# Patient Record
Sex: Female | Born: 1965
Health system: Southern US, Community
[De-identification: ages and names within clinical notes are randomized; demographics above are authoritative.]

## PROBLEM LIST (undated history)

## (undated) DIAGNOSIS — R5383 Other fatigue: Secondary | ICD-10-CM

## (undated) DIAGNOSIS — C50919 Malignant neoplasm of unspecified site of unspecified female breast: Secondary | ICD-10-CM

## (undated) DIAGNOSIS — Z803 Family history of malignant neoplasm of breast: Secondary | ICD-10-CM

## (undated) DIAGNOSIS — D649 Anemia, unspecified: Secondary | ICD-10-CM

## (undated) DIAGNOSIS — Z853 Personal history of malignant neoplasm of breast: Secondary | ICD-10-CM

## (undated) DIAGNOSIS — F419 Anxiety disorder, unspecified: Secondary | ICD-10-CM

## (undated) DIAGNOSIS — F411 Generalized anxiety disorder: Secondary | ICD-10-CM

## (undated) DIAGNOSIS — R7303 Prediabetes: Secondary | ICD-10-CM

## (undated) DIAGNOSIS — Z923 Personal history of irradiation: Secondary | ICD-10-CM

## (undated) DIAGNOSIS — C801 Malignant (primary) neoplasm, unspecified: Secondary | ICD-10-CM

## (undated) DIAGNOSIS — E119 Type 2 diabetes mellitus without complications: Secondary | ICD-10-CM

## (undated) DIAGNOSIS — E039 Hypothyroidism, unspecified: Secondary | ICD-10-CM

## (undated) DIAGNOSIS — R0602 Shortness of breath: Secondary | ICD-10-CM

## (undated) DIAGNOSIS — E785 Hyperlipidemia, unspecified: Secondary | ICD-10-CM

## (undated) DIAGNOSIS — M549 Dorsalgia, unspecified: Secondary | ICD-10-CM

## (undated) DIAGNOSIS — C50211 Malignant neoplasm of upper-inner quadrant of right female breast: Principal | ICD-10-CM

## (undated) DIAGNOSIS — I10 Essential (primary) hypertension: Secondary | ICD-10-CM

## (undated) DIAGNOSIS — J189 Pneumonia, unspecified organism: Secondary | ICD-10-CM

## (undated) HISTORY — DX: Personal history of malignant neoplasm of breast: Z85.3

## (undated) HISTORY — DX: Shortness of breath: R06.02

## (undated) HISTORY — DX: Malignant neoplasm of upper-inner quadrant of right female breast: C50.211

## (undated) HISTORY — DX: Prediabetes: R73.03

## (undated) HISTORY — PX: CHOLECYSTECTOMY: SHX55

## (undated) HISTORY — DX: Dorsalgia, unspecified: M54.9

## (undated) HISTORY — DX: Hyperlipidemia, unspecified: E78.5

## (undated) HISTORY — DX: Personal history of irradiation: Z92.3

## (undated) HISTORY — PX: BARTHOLIN GLAND CYST EXCISION: SHX565

## (undated) HISTORY — PX: BREAST BIOPSY: SHX20

## (undated) HISTORY — DX: Family history of malignant neoplasm of breast: Z80.3

## (undated) HISTORY — PX: BREAST LUMPECTOMY: SHX2

## (undated) HISTORY — PX: OTHER SURGICAL HISTORY: SHX169

## (undated) HISTORY — DX: Generalized anxiety disorder: F41.1

## (undated) HISTORY — DX: Other fatigue: R53.83

---

## 1997-09-28 ENCOUNTER — Other Ambulatory Visit: Admission: RE | Admit: 1997-09-28 | Discharge: 1997-09-28 | Payer: Self-pay | Admitting: Gynecology

## 1999-05-31 ENCOUNTER — Other Ambulatory Visit: Admission: RE | Admit: 1999-05-31 | Discharge: 1999-05-31 | Payer: Self-pay | Admitting: Obstetrics and Gynecology

## 2000-03-10 ENCOUNTER — Inpatient Hospital Stay (HOSPITAL_COMMUNITY): Admission: RE | Admit: 2000-03-10 | Discharge: 2000-03-10 | Payer: Self-pay | Admitting: *Deleted

## 2000-06-30 ENCOUNTER — Inpatient Hospital Stay (HOSPITAL_COMMUNITY): Admission: RE | Admit: 2000-06-30 | Discharge: 2000-06-30 | Payer: Self-pay | Admitting: Obstetrics and Gynecology

## 2000-07-18 ENCOUNTER — Other Ambulatory Visit: Admission: RE | Admit: 2000-07-18 | Discharge: 2000-07-18 | Payer: Self-pay | Admitting: *Deleted

## 2000-07-27 ENCOUNTER — Inpatient Hospital Stay (HOSPITAL_COMMUNITY): Admission: AD | Admit: 2000-07-27 | Discharge: 2000-07-27 | Payer: Self-pay | Admitting: Obstetrics and Gynecology

## 2001-04-26 ENCOUNTER — Inpatient Hospital Stay (HOSPITAL_COMMUNITY): Admission: AD | Admit: 2001-04-26 | Discharge: 2001-04-30 | Payer: Self-pay | Admitting: *Deleted

## 2001-04-26 ENCOUNTER — Encounter (INDEPENDENT_AMBULATORY_CARE_PROVIDER_SITE_OTHER): Payer: Self-pay | Admitting: Specialist

## 2001-05-30 ENCOUNTER — Other Ambulatory Visit: Admission: RE | Admit: 2001-05-30 | Discharge: 2001-05-30 | Payer: Self-pay | Admitting: *Deleted

## 2001-08-04 ENCOUNTER — Encounter: Admission: RE | Admit: 2001-08-04 | Discharge: 2001-08-04 | Payer: Self-pay | Admitting: Internal Medicine

## 2001-09-16 ENCOUNTER — Encounter: Payer: Self-pay | Admitting: *Deleted

## 2001-09-16 ENCOUNTER — Ambulatory Visit (HOSPITAL_COMMUNITY): Admission: RE | Admit: 2001-09-16 | Discharge: 2001-09-16 | Payer: Self-pay | Admitting: *Deleted

## 2002-06-10 ENCOUNTER — Other Ambulatory Visit: Admission: RE | Admit: 2002-06-10 | Discharge: 2002-06-10 | Payer: Self-pay | Admitting: Obstetrics & Gynecology

## 2002-09-18 ENCOUNTER — Encounter: Payer: Self-pay | Admitting: Obstetrics & Gynecology

## 2002-09-18 ENCOUNTER — Ambulatory Visit (HOSPITAL_COMMUNITY): Admission: RE | Admit: 2002-09-18 | Discharge: 2002-09-18 | Payer: Self-pay | Admitting: Obstetrics & Gynecology

## 2003-06-15 ENCOUNTER — Other Ambulatory Visit: Admission: RE | Admit: 2003-06-15 | Discharge: 2003-06-15 | Payer: Self-pay | Admitting: Obstetrics & Gynecology

## 2003-08-24 ENCOUNTER — Ambulatory Visit (HOSPITAL_COMMUNITY): Admission: RE | Admit: 2003-08-24 | Discharge: 2003-08-24 | Payer: Self-pay | Admitting: Obstetrics & Gynecology

## 2003-09-20 ENCOUNTER — Encounter: Admission: RE | Admit: 2003-09-20 | Discharge: 2003-09-20 | Payer: Self-pay | Admitting: Obstetrics & Gynecology

## 2004-10-18 ENCOUNTER — Encounter: Admission: RE | Admit: 2004-10-18 | Discharge: 2004-10-18 | Payer: Self-pay | Admitting: Obstetrics & Gynecology

## 2005-03-18 ENCOUNTER — Emergency Department (HOSPITAL_COMMUNITY): Admission: EM | Admit: 2005-03-18 | Discharge: 2005-03-18 | Payer: Self-pay | Admitting: Family Medicine

## 2005-05-21 ENCOUNTER — Emergency Department (HOSPITAL_COMMUNITY): Admission: EM | Admit: 2005-05-21 | Discharge: 2005-05-21 | Payer: Self-pay | Admitting: Family Medicine

## 2005-10-23 ENCOUNTER — Ambulatory Visit (HOSPITAL_COMMUNITY): Admission: RE | Admit: 2005-10-23 | Discharge: 2005-10-23 | Payer: Self-pay | Admitting: Obstetrics & Gynecology

## 2006-06-17 ENCOUNTER — Ambulatory Visit (HOSPITAL_COMMUNITY): Admission: RE | Admit: 2006-06-17 | Discharge: 2006-06-17 | Payer: Self-pay | Admitting: Internal Medicine

## 2006-07-19 ENCOUNTER — Encounter (INDEPENDENT_AMBULATORY_CARE_PROVIDER_SITE_OTHER): Payer: Self-pay | Admitting: General Surgery

## 2006-07-19 ENCOUNTER — Ambulatory Visit (HOSPITAL_COMMUNITY): Admission: RE | Admit: 2006-07-19 | Discharge: 2006-07-19 | Payer: Self-pay | Admitting: General Surgery

## 2006-07-25 ENCOUNTER — Ambulatory Visit (HOSPITAL_COMMUNITY): Admission: RE | Admit: 2006-07-25 | Discharge: 2006-07-25 | Payer: Self-pay | Admitting: General Surgery

## 2006-07-26 ENCOUNTER — Ambulatory Visit (HOSPITAL_COMMUNITY): Admission: RE | Admit: 2006-07-26 | Discharge: 2006-07-26 | Payer: Self-pay | Admitting: General Surgery

## 2006-10-29 ENCOUNTER — Ambulatory Visit (HOSPITAL_COMMUNITY): Admission: RE | Admit: 2006-10-29 | Discharge: 2006-10-29 | Payer: Self-pay | Admitting: Obstetrics & Gynecology

## 2006-11-05 ENCOUNTER — Ambulatory Visit (HOSPITAL_COMMUNITY): Admission: RE | Admit: 2006-11-05 | Discharge: 2006-11-05 | Payer: Self-pay | Admitting: General Surgery

## 2007-04-20 ENCOUNTER — Emergency Department (HOSPITAL_COMMUNITY): Admission: EM | Admit: 2007-04-20 | Discharge: 2007-04-20 | Payer: Self-pay | Admitting: Family Medicine

## 2007-09-02 ENCOUNTER — Encounter: Admission: RE | Admit: 2007-09-02 | Discharge: 2007-10-21 | Payer: Self-pay | Admitting: Orthopedic Surgery

## 2007-11-13 ENCOUNTER — Ambulatory Visit (HOSPITAL_COMMUNITY): Admission: RE | Admit: 2007-11-13 | Discharge: 2007-11-13 | Payer: Self-pay | Admitting: Obstetrics & Gynecology

## 2007-11-17 ENCOUNTER — Encounter: Admission: RE | Admit: 2007-11-17 | Discharge: 2007-11-17 | Payer: Self-pay | Admitting: Obstetrics & Gynecology

## 2008-12-14 ENCOUNTER — Ambulatory Visit (HOSPITAL_COMMUNITY): Admission: RE | Admit: 2008-12-14 | Discharge: 2008-12-14 | Payer: Self-pay | Admitting: Obstetrics & Gynecology

## 2009-01-24 ENCOUNTER — Emergency Department (HOSPITAL_COMMUNITY): Admission: EM | Admit: 2009-01-24 | Discharge: 2009-01-24 | Payer: Self-pay | Admitting: Emergency Medicine

## 2009-10-12 ENCOUNTER — Emergency Department (HOSPITAL_COMMUNITY): Admission: EM | Admit: 2009-10-12 | Discharge: 2009-10-12 | Payer: Self-pay | Admitting: Family Medicine

## 2010-01-10 ENCOUNTER — Ambulatory Visit (HOSPITAL_COMMUNITY)
Admission: RE | Admit: 2010-01-10 | Discharge: 2010-01-10 | Payer: Self-pay | Source: Home / Self Care | Admitting: Obstetrics & Gynecology

## 2010-01-19 ENCOUNTER — Encounter: Admission: RE | Admit: 2010-01-19 | Discharge: 2010-01-19 | Payer: Self-pay | Admitting: Obstetrics & Gynecology

## 2010-03-12 ENCOUNTER — Encounter: Payer: Self-pay | Admitting: Obstetrics & Gynecology

## 2010-03-13 ENCOUNTER — Encounter: Payer: Self-pay | Admitting: General Surgery

## 2010-07-04 NOTE — Op Note (Signed)
Marie Wilson, Marie Wilson NO.:  [id number]   MEDICAL RECORD NO.:  [id number]          PATIENT TYPE:  AMB   LOCATION:  SDS                          FACILITY:  [facility]   PHYSICIAN:  Ollen Gross. Vernell Morgans, M.D. DATE OF BIRTH:  1966-01-04   DATE OF PROCEDURE:  07/19/2006  DATE OF DISCHARGE:  07/19/2006                               OPERATIVE REPORT   PREOPERATIVE DIAGNOSIS:  Gallstones.   POSTOPERATIVE DIAGNOSIS:  Cholecystitis with cholelithiasis.   PROCEDURE:  Laparoscopic cholecystectomy with intraoperative  cholangiogram.   SURGEON:  Dr. Carolynne Edouard.   ASSISTANT:  Dr. Zachery Dakins.   ANESTHESIA:  General endotracheal.   PROCEDURE:  After informed consent was obtained, the patient was brought  to the operating room and placed in the supine position on the operating  table.  After adequate induction of general anesthesia, the patient's  abdomen was prepped with Betadine and draped in usual sterile manner.  The area below the umbilicus was infiltrated with 0.25% Marcaine.  A  small incision was made with a 15 blade knife.  This incision was  carried down through the subcutaneous tissue bluntly with a hemostat and  Army-Navy retractors until the linea alba was identified.  Linea alba  was incised with a 15 blade knife, and each side was grasped with Kocher  clamps and elevated anteriorly.  The preperitoneal space was probed  bluntly with a hemostat until the peritoneum was opened, and access was  gained to the abdominal cavity.  The 0 Vicryl pursestring stitch was  placed in the fascia around the opening.  Hasson cannula was placed  through the opening and anchored in place with the previously placed  Vicryl pursestring stitch.  The abdomen was then insufflated with carbon  dioxide without difficulty.  The patient was placed in head-up position  and rotated slightly with the right side up.  Laparoscope was inserted  through the Hasson cannula, and the right upper quadrant was  inspected.  The dome of the gallbladder and liver readily identified.  Next, the  epigastric region was infiltrated with 0.25% Marcaine.  A small incision  was made a 15 blade knife, and a 10-mm port was placed bluntly through  this incision into the abdominal cavity under direct vision.  Sites were  then chosen laterally on the right side of the abdomen for placement of  5-mm ports.  Each of these areas were infiltrated with 0.25% Marcaine.  Small stab incisions were made with a 15 blade knife, and 5-mm ports  were placed bluntly through these incisions into the abdominal cavity  under direct vision.  The gallbladder was very thick and edematous and  inflamed, and we were unable to grasp it with blunt graspers.  We  therefore placed an aspirator through the epigastric port and punctured  the dome of the gallbladder to aspirate some clear fluid.  This allowed  [place] to put a blunt grasper through the lateral most 5-mm port and grasp  the dome of gallbladder and elevate it anteriorly and superiorly.  Another blunt grasper was placed through the other 5-mm port and used  to  retract on the body and neck of the gallbladder.  There were some  omental adhesions to the body of the gallbladder that were taken down  bluntly with dissecting.  Once the gallbladder was able to be elevated,  a dissector was used to open the peritoneal reflection at the  gallbladder neck with the electrocautery.  Blunt dissection was then  carried out in this area until the gallbladder neck/cystic duct junction  was readily identified, and a good window was created.  A single clip  was placed on the gallbladder neck.  A small ductotomy was made just  below the clip.  A 14-gauge Angiocath was placed percutaneously through  the anterior abdominal wall under direct vision.  A Reddick  cholangiogram catheter was placed through the Angiocath and flushed.  The Reddick catheter was then placed within the cystic duct and  anchored  in place with the clip.  A cholangiogram was obtained that showed no  filling defects, good emptying in the duodenum and adequate length on  the cystic duct.  The anchoring clip and catheter was then removed from  the patient.  Three clips were placed proximally on the cystic duct, and  the duct was divided between the 2 sets of clips.  Posterior to this,  the cystic artery was identified and again dissected bluntly in a  circumferential manner until a good window was created.  Two clips were  placed proximally and one distally on the artery, and the artery was  divided between the two.  Next, a laparoscopic hook cautery device was  used to separate the gallbladder from liver bed.  Prior to completely  detaching the gallbladder from liver bed, several small bleeding points  were coagulated with electrocautery until the area was completely  hemostatic.  The gallbladder was then detached the rest of the way from  liver bed without difficulty with the laparoscopic hook cautery.  Laparoscopic bag was inserted through the epigastric port.  Gallbladder  was placed in the bag and the bag was sealed.  The abdomen was then  irrigated with copious amounts of saline until the effluent was clear.  The liver bed was inspected again and found to be hemostatic.  The  laparoscope was then moved to the epigastric port.  A gallbladder  grasper was placed through the Hasson cannula and used to grasp the  opening of the bag.  The bag with the gallbladder was then removed  through the infraumbilical port without difficulty.  The fascial defect  was closed with the previously placed Vicryl pursestring stitch as well  as with another figure-of-eight 0 Vicryl stitch.  The rest of ports were  then removed under direct vision and were found be hemostatic.  Gas was  allowed to escape.  Skin incisions were all closed with interrupted 4-0 Monocryl subcuticular stitches.  Benzoin, Steri-Strips and sterile   dressings were applied.  The patient tolerated the procedure well.  At  the end of the case, all needle, sponge and instrument counts were  correct.  The patient was then awakened and taken to recovery in stable  condition.      Ollen Gross. Vernell Morgans, M.D.  Electronically Signed     PST/MEDQ  D:  07/19/2006  T:  07/19/2006  Job:  [id number]

## 2010-07-07 NOTE — Op Note (Signed)
NAME:  Marie Wilson, Marie Wilson                         ACCOUNT NO.:  [id number]   MEDICAL RECORD NO.:  [id number]                   PATIENT TYPE:  AMB   LOCATION:  SDC                                  FACILITY:  [facility]   PHYSICIAN:  Genia Del, M.D.             DATE OF BIRTH:  25-Jul-1965   DATE OF PROCEDURE:  08/24/2003  DATE OF DISCHARGE:                                 OPERATIVE REPORT   PREOPERATIVE DIAGNOSIS:  Recurrent left Bartholin's gland abscess.   POSTOPERATIVE DIAGNOSIS:  Recurrent left Bartholin's gland abscess.   PROCEDURE:  Left Bartholin's gland marsupialization.   SURGEON:  Genia Del, M.D.   ANESTHESIOLOGIST:  Dr. Jean Rosenthal.   DESCRIPTION OF PROCEDURE:  Under general anesthesia with endotracheal  intubation, the patient is in the lithotomy position.  She is prepped with  Hibiclens in the suprapubic vulvar and vaginal areas and draped as usual.  On examination, we see the site of incision and drainage on the left lower  vulva.  It is about 1 cm in diameter.  The left Bartholin gland abscess is  palpated at about 2 to 3 cm in diameter.  It extends superiorly and  inferiorly over that diameter and about 2 cm wide.  The incision is  prolonged vertically with a scalpel to an opening of about 2 cm.  The gland  is opened.  Exploration with a finger is done.  All loculations are opened.  No pus evacuated but a lot of induration is present.  We proceed with  marsupialization with about 10 stitches all around joining the wall of the  gland to the skin of the vulva around the incision.  That completes  hemostasis.  We also used the electrocautery just on the inside of the gland  to complete hemostasis a little deeper.  We then pack the left Bartholin  gland with one inch plain packing.  We pack it very tight to help with  hemostasis as well.   ESTIMATED BLOOD LOSS:  The estimated blood loss was minimal.   No complications occurred and the patient was transferred to  recovery room  in good status.  Note that she will follow up in two days at [facility]  office to remove the packing.  At the beginning of the intervention cultures  were done.  The patient will be sent home on Flagyl 500 mg b.i.d. for seven  days.                                               Genia Del, M.D.    ML/MEDQ  D:  08/24/2003  T:  08/24/2003  Job:  [id number]

## 2010-07-07 NOTE — Discharge Summary (Signed)
[facility]  Patient:    Marie Wilson, Marie Wilson Visit Number: [id number] MRN: [id number]          Service Type: OBS Location: 910A 9118 01 Attending Physician:  Ermalene Searing Dictated by:   Genia Del, M.D. Admit Date:  04/26/2001 Discharge Date: 04/30/2001                             Discharge Summary  ADMISSION DIAGNOSES:          1. 40 weeks 6 days gestation.                               2. Induction for postdates.                               3. Favorable cervix.  DISCHARGE DIAGNOSES:          1. 40 weeks 6 days gestation.                               2. Induction for postdates.                               3. Favorable cervix.                               4. Nonreassuring fetal heart rate monitoring.                               5. Birth of healthy babyboy by cesarean section.  HOSPITAL COURSE:              Marie Wilson is a gravida 1, 40 weeks 6 days gestation, induction for postdates with unfavorable cervix.  She had cervical ripening with Cervidil and then Pitocin was started.  Artificial rupture of membranes was achieved around 4:15 a.m. on April 27, 2001.  At that time, fetal heart rate was 130 to 140 per minute, accellerations were present and good variability was seen.  But decellerations lasting three minutes in the 70s recovered with oxygen and left decubitus position.  Given that uterine contractions were difficult to pickup an intrauterine pressure catheter was inserted and an electrode for heart rate monitoring.  Around 10 a.m. the decision was made to proceed with an urgent cesarean section because of frequent prolonged deep variable decellerations.  At that time, the patients cervix was 4 cm, 90%, vertex -1.  An urgent low transverse cesarean section was done.  Thick meconium was found at the time of delivery and two tight nuchal cords were present.  Birth of a babyboy at 11:10.  DeLee suction was done after delivery of  the head and the baby was given to the neonatal team. Apgars were 8 and 9, pH was 7.26.  Estimated blood loss was 1000 cc.  No complications.  Postoperative course was unremarkable.  The patient remaines stable and afebrile.  She was discharged on postoperative day #3.  Her hemoglobin on postoperative day #1 was 11 with a hematocrit of 33.6.  She was given postoperative and postpartum advise.  She was prescribed Tylox one tablet  p.o. q.4h. p.r.n. x30 and she will follow up at [facility] in four to six weeks. Dictated by:   Genia Del, M.D. Attending Physician:  Marina Gravel B DD:  05/25/01 TD:  05/26/01 Job: [id number]

## 2010-07-07 NOTE — H&P (Signed)
[facility]  Patient:    TERIA, KHACHATRYAN Visit Number: [id number] MRN: [id number]          Service Type: Attending:  Marina Gravel, M.D. Dictated by:   Marina Gravel, M.D. Adm. Date:  04/26/01                           History and Physical  ADMISSION DIAGNOSIS:          Post dates pregnancy.  INTENDED PROCEDURE:           Induction of labor.  HISTORY OF PRESENT ILLNESS:   A 45 year old white female gravida 1 para 0 at 53 and five-sevenths weeks presents for cervical ripening and induction of labor.  Prenatal care at [facility], Dr. Marina Gravel, uncomplicated except for advanced maternal age.  Normal amniocentesis.  PAST MEDICAL HISTORY:         Uterine fibroids, HPV.  PAST SURGICAL HISTORY:        None.  ALLERGIES:                    None.  SOCIAL HISTORY:               No alcohol, tobacco, or other drugs.  FAMILY HISTORY:               Mother with breast cancer in her 67s, sister with Hodgkins lymphoma, and her father with coronary artery disease.  REVIEW OF SYSTEMS:            Otherwise noncontributory.  PHYSICAL EXAMINATION:  VITAL SIGNS:                  Height 5 feet 6 inches, weight 269 pounds. Blood pressure 124/86.  GENERAL:                      Alert and oriented, no acute distress.  SKIN:                         Warm and dry, no lesions.  HEART:                        Regular rate and rhythm.  LUNGS:                        Clear to auscultation.  ABDOMEN:                      Gravid, fundal height appropriate.  PELVIC:                       Cervix is closed, 50% effaced, -1 station, vertex presentation.  LABORATORY DATA:              Ultrasound on March 6 shows estimated fetal weight 3813 g (8 pounds 6 ounces), normal amniotic fluid, normal fetal breathing movement and activity.  ASSESSMENT:                   Post dates pregnancy, unfavorable cervix.  PLAN:                         Admission for overnight cervical  ripening and Pitocin for induction of labor. Dictated by:   Marina Gravel, M.D. Attending:  Marina Gravel, M.D. DD:  04/24/01 TD:  04/24/01 Job: [id number]

## 2010-07-07 NOTE — Op Note (Signed)
[facility]  Patient:    Marie, Wilson Visit Number: [id number] MRN: [id number]          Service Type: OBS Location: 910A 9118 01 Attending Physician:  Ermalene Searing Dictated by:   Genia Del, M.D. Admit Date:  04/26/2001                             Operative Report  DATE OF BIRTH:                [birth year]  PREOPERATIVE DIAGNOSES:       1. A 40 week and 6 day gestation.                               2. Induction for postdates.                               3. Nonreassuring fetal heart rate monitoring.  POSTOPERATIVE DIAGNOSES:      1. A 40 week and 6 day gestation.                               2. Induction for postdates.                               3. Nonreassuring fetal heart rate monitoring.                               4. Thick meconium.                               5. Two tight nuchal cords.  INTERVENTION:                 Urgent low transverse cesarean section.  SURGEON:                      Genia Del, M.D.  ANESTHESIOLOGIST:             Raul Del, M.D.  DESCRIPTION OF PROCEDURE:     Under epidural with the patient in the 15-degree left decubitus position, she was prepped with Betadine on the suprapubic, vulvar and vagina areas.  A bladder catheter was already inserted.  The patient was draped as usual.  A Pfannenstiel incision was made with a scalpel. The aponeurosis was opened transversely with electrocautery and Mayo scissors. The rectus muscles were separated from the aponeurosis in the midline.  The parietal peritoneum was opened longitudinally with Mayo scissors.  We then opened the visceral peritoneum over the lower uterine segment transversely with Metzenbaum scissors.  The bladder was moved downward and a bladder retractor was inserted.  A transverse incision was made with a scalpel over the lower uterine segment.  Hysterotomy was extended on each side with dressing scissors.  The membranes  were ruptured.  Amniotic fluid was thick meconium.  Delivery of the babys and suction with DeLee.  We then removed without tight nuchal cords and the baby was born at 11:10.  Sex is female.  The cord was clamped and cut.  The baby was suctioned again  and given to the neonatal team.  Apgars were 8 and 9 and pH was 7.26.  The placenta was evacuated manually and appeared complete with three vessels.  Uterine revision was done with a wet lap.  We could not exteriorize the uterus because of multiple uterine myomas which increased the overall uterine size.  The uterus was well contracted with Pitocin IV.  A dose of Ancef 2 g IV was given.  We closed the hysterotomy with 0 Vicryl in a running lock suture.  A second plane was done with 0 Vicryl in a mattress fashion.  Hemostasis was completed at the right angle with X stitches with 0 Vicryl.  Both tubes and both ovaries were normal in size and appearance.  The abdominopelvic area was irrigated and suctioned.  Hemostasis was verified at the level of the rectus muscles, aponeurosis and bladder flap and controlled with electrocautery.  We then closed the aponeurosis with two half running sutures with 0 Vicryl.  The adipose tissue was verified and hemostasis completed with electrocautery.  We infiltrated the subcutaneous tissues with 0.25 Marcaine plain, 20 cc.  The skin was then reapproximated with staples.  A dry dressing was applied. Sponge, needle and instrument counts was complete x2.  Estimated blood loss was  L.  No complications occurred.  The patient transferred to the recovery room in good status.  Her blood group is O positive, rubella immune,Dictated by:   Genia Del, M.D. Attending Physician:  Marina Gravel B DD:  04/27/01 TD:  04/28/01 Job: [id number]

## 2010-11-13 LAB — POCT RAPID STREP A: Streptococcus, Group A Screen (Direct): NEGATIVE

## 2011-01-29 ENCOUNTER — Other Ambulatory Visit: Payer: Self-pay | Admitting: Obstetrics & Gynecology

## 2011-01-29 DIAGNOSIS — Z1231 Encounter for screening mammogram for malignant neoplasm of breast: Secondary | ICD-10-CM

## 2011-02-22 ENCOUNTER — Ambulatory Visit
Admission: RE | Admit: 2011-02-22 | Discharge: 2011-02-22 | Disposition: A | Discharge status: Home / Self Care | Payer: Commercial Managed Care - PPO | Source: Ambulatory Visit | Attending: Obstetrics & Gynecology | Admitting: Obstetrics & Gynecology

## 2011-02-22 DIAGNOSIS — Z1231 Encounter for screening mammogram for malignant neoplasm of breast: Secondary | ICD-10-CM

## 2012-07-15 ENCOUNTER — Other Ambulatory Visit: Payer: Self-pay

## 2012-07-15 DIAGNOSIS — Z1231 Encounter for screening mammogram for malignant neoplasm of breast: Secondary | ICD-10-CM

## 2012-08-15 ENCOUNTER — Ambulatory Visit
Admission: RE | Admit: 2012-08-15 | Discharge: 2012-08-15 | Disposition: A | Discharge status: Home / Self Care | Payer: 59 | Source: Ambulatory Visit

## 2012-08-15 DIAGNOSIS — Z1231 Encounter for screening mammogram for malignant neoplasm of breast: Secondary | ICD-10-CM

## 2013-03-28 ENCOUNTER — Encounter: Payer: 59 | Attending: Endocrinology

## 2013-10-07 ENCOUNTER — Other Ambulatory Visit: Payer: Self-pay

## 2013-10-07 DIAGNOSIS — Z1231 Encounter for screening mammogram for malignant neoplasm of breast: Secondary | ICD-10-CM

## 2013-10-15 ENCOUNTER — Ambulatory Visit
Admission: RE | Admit: 2013-10-15 | Discharge: 2013-10-15 | Disposition: A | Discharge status: Home / Self Care | Payer: 59 | Source: Ambulatory Visit

## 2013-10-15 DIAGNOSIS — Z1231 Encounter for screening mammogram for malignant neoplasm of breast: Secondary | ICD-10-CM

## 2014-09-01 ENCOUNTER — Other Ambulatory Visit: Payer: Self-pay | Admitting: Obstetrics & Gynecology

## 2014-09-07 ENCOUNTER — Encounter (HOSPITAL_COMMUNITY): Payer: Self-pay

## 2014-09-07 ENCOUNTER — Other Ambulatory Visit: Payer: Self-pay

## 2014-09-07 ENCOUNTER — Encounter (HOSPITAL_COMMUNITY)
Admission: RE | Admit: 2014-09-07 | Discharge: 2014-09-07 | Disposition: A | Discharge status: Home / Self Care | Payer: 59 | Source: Ambulatory Visit | Attending: Obstetrics & Gynecology | Admitting: Obstetrics & Gynecology

## 2014-09-07 DIAGNOSIS — E039 Hypothyroidism, unspecified: Secondary | ICD-10-CM | POA: Diagnosis not present

## 2014-09-07 DIAGNOSIS — Z6841 Body Mass Index (BMI) 40.0 and over, adult: Secondary | ICD-10-CM | POA: Insufficient documentation

## 2014-09-07 DIAGNOSIS — N736 Female pelvic peritoneal adhesions (postinfective): Secondary | ICD-10-CM | POA: Insufficient documentation

## 2014-09-07 DIAGNOSIS — R102 Pelvic and perineal pain: Secondary | ICD-10-CM | POA: Insufficient documentation

## 2014-09-07 DIAGNOSIS — E119 Type 2 diabetes mellitus without complications: Secondary | ICD-10-CM | POA: Insufficient documentation

## 2014-09-07 DIAGNOSIS — D259 Leiomyoma of uterus, unspecified: Secondary | ICD-10-CM | POA: Diagnosis present

## 2014-09-07 DIAGNOSIS — N920 Excessive and frequent menstruation with regular cycle: Secondary | ICD-10-CM | POA: Diagnosis present

## 2014-09-07 DIAGNOSIS — Z85828 Personal history of other malignant neoplasm of skin: Secondary | ICD-10-CM | POA: Insufficient documentation

## 2014-09-07 DIAGNOSIS — F419 Anxiety disorder, unspecified: Secondary | ICD-10-CM | POA: Insufficient documentation

## 2014-09-07 DIAGNOSIS — D649 Anemia, unspecified: Secondary | ICD-10-CM | POA: Diagnosis not present

## 2014-09-07 HISTORY — DX: Hypothyroidism, unspecified: E03.9

## 2014-09-07 HISTORY — DX: Anxiety disorder, unspecified: F41.9

## 2014-09-07 HISTORY — DX: Type 2 diabetes mellitus without complications: E11.9

## 2014-09-07 HISTORY — DX: Malignant (primary) neoplasm, unspecified: C80.1

## 2014-09-07 LAB — CBC
HCT: 28.3 % — ABNORMAL LOW (ref 36.0–46.0)
Hemoglobin: 8.5 g/dL — ABNORMAL LOW (ref 12.0–15.0)
MCH: 26.4 pg (ref 26.0–34.0)
MCHC: 30 g/dL (ref 30.0–36.0)
MCV: 87.9 fL (ref 78.0–100.0)
Platelets: 395 10*3/uL (ref 150–400)
RBC: 3.22 MIL/uL — ABNORMAL LOW (ref 3.87–5.11)
RDW: 15.8 % — ABNORMAL HIGH (ref 11.5–15.5)
WBC: 8.6 10*3/uL (ref 4.0–10.5)

## 2014-09-07 LAB — BASIC METABOLIC PANEL
Anion gap: 5 (ref 5–15)
BUN: 11 mg/dL (ref 6–20)
CO2: 25 mmol/L (ref 22–32)
Calcium: 8.6 mg/dL — ABNORMAL LOW (ref 8.9–10.3)
Chloride: 109 mmol/L (ref 101–111)
Creatinine, Ser: 0.78 mg/dL (ref 0.44–1.00)
GFR calc Af Amer: >60 mL/min (ref >60)
GFR calc non Af Amer: >60 mL/min (ref >60)
Glucose, Bld: 95 mg/dL (ref 65–99)
Potassium: 4 mmol/L (ref 3.5–5.1)
Sodium: 139 mmol/L (ref 135–145)

## 2014-09-07 MED ORDER — DEXTROSE 5 % IV SOLN
3.0000 g | INTRAVENOUS | Status: AC
  Administered 2014-09-08 (×2): 3 g via INTRAVENOUS
  Filled 2014-09-07: qty 3000

## 2014-09-07 NOTE — H&P (Signed)
Marie Wilson is an 49 y.o. female single G1P1  RP:  Pelvic pain/Menorrhagia/Fibroids for Robotic TLH/Bilateral Salpingectomy  Pertinent Gynecological History: Menses: Heavy Contraception: Cryselle Blood transfusions: none Sexually transmitted diseases: none Previous GYN Procedures: none Last mammogram: wnl  Date: 09/2013 Last pap: wnl, HPV HR neg.  Date: 07/2014 OB History: G1P1   Past Medical History  Diagnosis Date  . Anxiety   . Hypothyroidism   . Diabetes mellitus without complication     diet controlled- recent A1C=5  . Cancer     skin    Past Surgical History  Procedure Laterality Date  . Cesarean section    . Skin growth    . Bartholin gland cyst excision      No family history on file.  Social History:  reports that she has never smoked. She does not have any smokeless tobacco history on file. She reports that she does not drink alcohol or use illicit drugs.  Allergies: No Known Allergies  No prescriptions prior to admission    ROS Neg  There were no vitals taken for this visit. Physical Exam   Pelvic US:  Uterus with Fibroids, possible Adenomyosis.  Uterine volume 296 cc.  Ovaries wnl.  Results for orders placed or performed during the hospital encounter of 09/07/14 (from the past 24 hour(s))  CBC     Status: Abnormal   Collection Time: 09/07/14 12:30 PM  Result Value Ref Range   WBC 8.6 4.0 - 10.5 K/uL   RBC 3.22 (L) 3.87 - 5.11 MIL/uL   Hemoglobin 8.5 (L) 12.0 - 15.0 g/dL   HCT 28.3 (L) 36.0 - 46.0 %   MCV 87.9 78.0 - 100.0 fL   MCH 26.4 26.0 - 34.0 pg   MCHC 30.0 30.0 - 36.0 g/dL   RDW 15.8 (H) 11.5 - 15.5 %   Platelets 395 150 - 400 K/uL  Type and screen     Status: None   Collection Time: 09/07/14 12:30 PM  Result Value Ref Range   ABO/RH(D) O POS    Antibody Screen NEG    Sample Expiration 33/38/3291   Basic metabolic panel     Status: Abnormal   Collection Time: 09/07/14 12:30 PM  Result Value Ref Range   Sodium 139 135 - 145  mmol/L   Potassium 4.0 3.5 - 5.1 mmol/L   Chloride 109 101 - 111 mmol/L   CO2 25 22 - 32 mmol/L   Glucose, Bld 95 65 - 99 mg/dL   BUN 11 6 - 20 mg/dL   Creatinine, Ser 0.78 0.44 - 1.00 mg/dL   Calcium 8.6 (L) 8.9 - 10.3 mg/dL   GFR calc non Af Amer >60 >60 mL/min   GFR calc Af Amer >60 >60 mL/min   Anion gap 5 5 - 15    Assessment/Plan: Pelvic pain and menorrhagia refractory to medical Rx associated with uterine myomas/possible Adenomyosis.  Anemia.  Decision to proceed with Robotic TLH/Bilateral Salpingectomy.  Surgery and risks reviewed.  Marie Wilson,Marie Wilson 09/07/2014, 5:39 PM

## 2014-09-07 NOTE — Patient Instructions (Addendum)
Your procedure is scheduled on:09/08/14  Enter through the Main Entrance at :7am Pick up desk phone and dial [phone] and inform us of your arrival.  Please call [phone] if you have any problems the morning of surgery.  Remember: Do not eat food or drink liquids, including water, after midnight:tonight   You may brush your teeth the morning of surgery.   DO NOT wear jewelry, eye make-up, lipstick,body lotion, or dark fingernail polish.  (Polished toes are ok) You may wear deodorant.  If you are to be admitted after surgery, leave suitcase in car until your room has been assigned. Patients discharged on the day of surgery will not be allowed to drive home. Wear loose fitting, comfortable clothes for your ride home.

## 2014-09-07 NOTE — Anesthesia Preprocedure Evaluation (Addendum)
Anesthesia Evaluation  Patient identified by MRN, date of birth, ID band Patient awake    Reviewed: Allergy & Precautions, H&P , NPO status , Patient's Chart, lab work & pertinent test results, reviewed documented beta blocker date and time , Unable to perform ROS - Chart review only  History of Anesthesia Complications Negative for: history of anesthetic complications  Airway Mallampati: II  TM Distance: >3 FB Neck ROM: full    Dental no notable dental hx.    Pulmonary neg pulmonary ROS,  breath sounds clear to auscultation  Pulmonary exam normal       Cardiovascular Exercise Tolerance: Poor Normal cardiovascular examRhythm:regular Rate:Normal     Neuro/Psych PSYCHIATRIC DISORDERS Anxiety negative neurological ROS     GI/Hepatic negative GI ROS, Neg liver ROS,   Endo/Other  diabetes, Well Controlled, Type 2Hypothyroidism Morbid obesity  Renal/GU negative Renal ROS     Musculoskeletal   Abdominal (+) + obese,   Peds  Hematology  (+) anemia ,   Anesthesia Other Findings NPO appropriate, allergies reviewed Denies active cardiac or pulmonary symptoms, METS < 4 currently, likely due to symptomatic anemia from menorrhagia No recent congestive cough or symptoms of upper respiratory infection Meds - propranolol yesterday, takes for anxiety, taking oral iron Honest conversation about risk of needing RBC transfusion with the patient, she is typed and screened so blood can be available quickly if needed Patient is morbidly obese and with a starting Hgb of only 8.   Reproductive/Obstetrics negative OB ROS                           Anesthesia Physical Anesthesia Plan  ASA: III  Anesthesia Plan: General   Post-op Pain Management:    Induction: Intravenous  Airway Management Planned: Oral ETT  Additional Equipment:   Intra-op Plan:   Post-operative Plan:   Informed Consent: I have  reviewed the patients History and Physical, chart, labs and discussed the procedure including the risks, benefits and alternatives for the proposed anesthesia with the patient or authorized representative who has indicated his/her understanding and acceptance.   Dental Advisory Given  Plan Discussed with: Anesthesiologist and CRNA  Anesthesia Plan Comments:        Anesthesia Quick Evaluation

## 2014-09-08 ENCOUNTER — Encounter (HOSPITAL_COMMUNITY)
Admission: RE | Disposition: A | Discharge status: Home / Self Care | Payer: Self-pay | Source: Ambulatory Visit | Attending: Obstetrics & Gynecology

## 2014-09-08 ENCOUNTER — Ambulatory Visit (HOSPITAL_COMMUNITY): Payer: 59 | Admitting: Anesthesiology

## 2014-09-08 ENCOUNTER — Encounter (HOSPITAL_COMMUNITY): Payer: Self-pay | Admitting: Certified Registered Nurse Anesthetist

## 2014-09-08 ENCOUNTER — Ambulatory Visit (HOSPITAL_COMMUNITY)
Admission: RE | Admit: 2014-09-08 | Discharge: 2014-09-09 | Disposition: A | Discharge status: Home / Self Care | Payer: 59 | Source: Ambulatory Visit | Attending: Obstetrics & Gynecology | Admitting: Obstetrics & Gynecology

## 2014-09-08 DIAGNOSIS — Z9889 Other specified postprocedural states: Secondary | ICD-10-CM

## 2014-09-08 DIAGNOSIS — R102 Pelvic and perineal pain: Secondary | ICD-10-CM | POA: Diagnosis not present

## 2014-09-08 HISTORY — PX: ROBOTIC ASSISTED TOTAL HYSTERECTOMY: SHX6085

## 2014-09-08 LAB — GLUCOSE, CAPILLARY
GLUCOSE-CAPILLARY: 96 mg/dL (ref 65–99)
Glucose-Capillary: 125 mg/dL — ABNORMAL HIGH (ref 65–99)

## 2014-09-08 LAB — PREPARE RBC (CROSSMATCH)

## 2014-09-08 LAB — ABO/RH: ABO/RH(D): O POS

## 2014-09-08 LAB — PREGNANCY, URINE: Preg Test, Ur: NEGATIVE

## 2014-09-08 SURGERY — ROBOTIC ASSISTED TOTAL HYSTERECTOMY
Anesthesia: General | Site: Abdomen | Laterality: Bilateral

## 2014-09-08 MED ORDER — PROPOFOL 10 MG/ML IV BOLUS
INTRAVENOUS | Status: DC | PRN
  Administered 2014-09-08: 200 mg via INTRAVENOUS
  Administered 2014-09-08 (×2): 30 mg via INTRAVENOUS

## 2014-09-08 MED ORDER — GLYCOPYRROLATE 0.2 MG/ML IJ SOLN
INTRAMUSCULAR | Status: DC | PRN
  Administered 2014-09-08: 0.6 mg via INTRAVENOUS

## 2014-09-08 MED ORDER — LEVOTHYROXINE SODIUM 88 MCG PO TABS
88.0000 ug | ORAL_TABLET | Freq: Every day | ORAL | Status: DC
  Administered 2014-09-09: 88 ug via ORAL
  Filled 2014-09-08 (×2): qty 1

## 2014-09-08 MED ORDER — STERILE WATER FOR IRRIGATION IR SOLN
Status: DC | PRN
  Administered 2014-09-08: 3000 mL
  Administered 2014-09-08: 1000 mL via INTRAVESICAL

## 2014-09-08 MED ORDER — PROPOFOL 10 MG/ML IV BOLUS
INTRAVENOUS | Status: AC
  Filled 2014-09-08: qty 20

## 2014-09-08 MED ORDER — HYDROMORPHONE HCL 1 MG/ML IJ SOLN
INTRAMUSCULAR | Status: DC | PRN
  Administered 2014-09-08 (×2): 0.5 mg via INTRAVENOUS

## 2014-09-08 MED ORDER — ONDANSETRON HCL 4 MG/2ML IJ SOLN: 4.0000 mg | Freq: Once | INTRAMUSCULAR | Status: DC | PRN

## 2014-09-08 MED ORDER — SCOPOLAMINE 1 MG/3DAYS TD PT72
1.0000 | MEDICATED_PATCH | Freq: Once | TRANSDERMAL | Status: DC
  Administered 2014-09-08: 1.5 mg via TRANSDERMAL

## 2014-09-08 MED ORDER — PROMETHAZINE HCL 25 MG/ML IJ SOLN
INTRAMUSCULAR | Status: AC
  Administered 2014-09-08: 6.25 mg via INTRAVENOUS
  Filled 2014-09-08: qty 1

## 2014-09-08 MED ORDER — ARTIFICIAL TEARS OP OINT
TOPICAL_OINTMENT | OPHTHALMIC | Status: AC
  Filled 2014-09-08: qty 3.5

## 2014-09-08 MED ORDER — SUCCINYLCHOLINE CHLORIDE 20 MG/ML IJ SOLN
INTRAMUSCULAR | Status: AC
  Filled 2014-09-08: qty 1

## 2014-09-08 MED ORDER — SERTRALINE HCL 100 MG PO TABS
200.0000 mg | ORAL_TABLET | Freq: Every day | ORAL | Status: DC
  Administered 2014-09-08: 200 mg via ORAL
  Filled 2014-09-08 (×3): qty 2

## 2014-09-08 MED ORDER — OXYCODONE-ACETAMINOPHEN 5-325 MG PO TABS
1.0000 | ORAL_TABLET | ORAL | Status: DC | PRN
  Administered 2014-09-08: 1 via ORAL
  Administered 2014-09-09: 2 via ORAL
  Administered 2014-09-09 (×3): 1 via ORAL
  Filled 2014-09-08 (×2): qty 1
  Filled 2014-09-08: qty 2
  Filled 2014-09-08 (×2): qty 1

## 2014-09-08 MED ORDER — FENTANYL CITRATE (PF) 100 MCG/2ML IJ SOLN
INTRAMUSCULAR | Status: AC
  Administered 2014-09-08: 25 ug via INTRAVENOUS
  Filled 2014-09-08: qty 2

## 2014-09-08 MED ORDER — ONDANSETRON HCL 4 MG/2ML IJ SOLN
INTRAMUSCULAR | Status: AC
  Filled 2014-09-08: qty 2

## 2014-09-08 MED ORDER — LACTATED RINGERS IV SOLN
INTRAVENOUS | Status: DC | PRN
  Administered 2014-09-08: 09:00:00 via INTRAVENOUS

## 2014-09-08 MED ORDER — SCOPOLAMINE 1 MG/3DAYS TD PT72
MEDICATED_PATCH | TRANSDERMAL | Status: DC
Start: 2014-09-08 — End: 2014-09-09
  Administered 2014-09-08: 1.5 mg via TRANSDERMAL
  Filled 2014-09-08: qty 1

## 2014-09-08 MED ORDER — HYDROMORPHONE HCL 1 MG/ML IJ SOLN
INTRAMUSCULAR | Status: AC
  Filled 2014-09-08: qty 1

## 2014-09-08 MED ORDER — SODIUM CHLORIDE 0.9 % IV SOLN
INTRAVENOUS | Status: DC | PRN
  Administered 2014-09-08: 10:00:00 via INTRAVENOUS

## 2014-09-08 MED ORDER — DEXAMETHASONE SODIUM PHOSPHATE 10 MG/ML IJ SOLN
INTRAMUSCULAR | Status: DC | PRN
  Administered 2014-09-08: 4 mg via INTRAVENOUS

## 2014-09-08 MED ORDER — LIDOCAINE HCL (CARDIAC) 20 MG/ML IV SOLN
INTRAVENOUS | Status: DC | PRN
  Administered 2014-09-08: 30 mg via INTRAVENOUS

## 2014-09-08 MED ORDER — NEOSTIGMINE METHYLSULFATE 10 MG/10ML IV SOLN
INTRAVENOUS | Status: DC | PRN
  Administered 2014-09-08: 4 mg via INTRAVENOUS

## 2014-09-08 MED ORDER — LACTATED RINGERS IR SOLN
Status: DC | PRN
  Administered 2014-09-08: 3000 mL

## 2014-09-08 MED ORDER — KETOROLAC TROMETHAMINE 30 MG/ML IJ SOLN
INTRAMUSCULAR | Status: AC
Start: 2014-09-08 — End: 2014-09-08
  Filled 2014-09-08: qty 1

## 2014-09-08 MED ORDER — SODIUM CHLORIDE 0.9 % IJ SOLN
INTRAMUSCULAR | Status: AC
  Filled 2014-09-08: qty 50

## 2014-09-08 MED ORDER — EPHEDRINE SULFATE 50 MG/ML IJ SOLN
INTRAMUSCULAR | Status: DC | PRN
  Administered 2014-09-08 (×2): 10 mg via INTRAVENOUS

## 2014-09-08 MED ORDER — ROCURONIUM BROMIDE 100 MG/10ML IV SOLN
INTRAVENOUS | Status: DC | PRN
  Administered 2014-09-08 (×2): 5 mg via INTRAVENOUS
  Administered 2014-09-08: 20 mg via INTRAVENOUS
  Administered 2014-09-08: 50 mg via INTRAVENOUS
  Administered 2014-09-08: 10 mg via INTRAVENOUS

## 2014-09-08 MED ORDER — KETOROLAC TROMETHAMINE 30 MG/ML IJ SOLN
INTRAMUSCULAR | Status: AC
  Filled 2014-09-08: qty 1

## 2014-09-08 MED ORDER — NEOSTIGMINE METHYLSULFATE 10 MG/10ML IV SOLN
INTRAVENOUS | Status: AC
  Filled 2014-09-08: qty 1

## 2014-09-08 MED ORDER — FENTANYL CITRATE (PF) 250 MCG/5ML IJ SOLN
INTRAMUSCULAR | Status: AC
Start: 2014-09-08 — End: 2014-09-08
  Filled 2014-09-08: qty 5

## 2014-09-08 MED ORDER — HYDROMORPHONE HCL 1 MG/ML IJ SOLN
1.0000 mg | Freq: Once | INTRAMUSCULAR | Status: AC
  Administered 2014-09-08: 1 mg via INTRAVENOUS
  Filled 2014-09-08: qty 1

## 2014-09-08 MED ORDER — ONDANSETRON HCL 4 MG/2ML IJ SOLN
INTRAMUSCULAR | Status: DC | PRN
  Administered 2014-09-08: 4 mg via INTRAVENOUS

## 2014-09-08 MED ORDER — HYDROMORPHONE HCL 1 MG/ML IJ SOLN
1.0000 mg | INTRAMUSCULAR | Status: DC | PRN
  Administered 2014-09-08: 1 mg via INTRAVENOUS
  Filled 2014-09-08: qty 1

## 2014-09-08 MED ORDER — FENTANYL CITRATE (PF) 250 MCG/5ML IJ SOLN
INTRAMUSCULAR | Status: AC
  Filled 2014-09-08: qty 5

## 2014-09-08 MED ORDER — BUPIVACAINE HCL (PF) 0.25 % IJ SOLN
INTRAMUSCULAR | Status: AC
Start: 2014-09-08 — End: 2014-09-08
  Filled 2014-09-08: qty 30

## 2014-09-08 MED ORDER — GLYCOPYRROLATE 0.2 MG/ML IJ SOLN
INTRAMUSCULAR | Status: AC
  Filled 2014-09-08: qty 3

## 2014-09-08 MED ORDER — FENTANYL CITRATE (PF) 100 MCG/2ML IJ SOLN
INTRAMUSCULAR | Status: DC | PRN
  Administered 2014-09-08 (×8): 50 ug via INTRAVENOUS

## 2014-09-08 MED ORDER — ROPIVACAINE HCL 5 MG/ML IJ SOLN
INTRAMUSCULAR | Status: AC
  Filled 2014-09-08: qty 30

## 2014-09-08 MED ORDER — MIDAZOLAM HCL 2 MG/2ML IJ SOLN
INTRAMUSCULAR | Status: DC | PRN
  Administered 2014-09-08: 2 mg via INTRAVENOUS

## 2014-09-08 MED ORDER — BUPIVACAINE HCL (PF) 0.25 % IJ SOLN
INTRAMUSCULAR | Status: DC | PRN
  Administered 2014-09-08: 20 mL

## 2014-09-08 MED ORDER — DEXAMETHASONE SODIUM PHOSPHATE 4 MG/ML IJ SOLN
INTRAMUSCULAR | Status: AC
  Filled 2014-09-08: qty 1

## 2014-09-08 MED ORDER — LACTATED RINGERS IV SOLN
INTRAVENOUS | Status: DC
  Administered 2014-09-08 (×2): via INTRAVENOUS

## 2014-09-08 MED ORDER — IBUPROFEN 600 MG PO TABS
600.0000 mg | ORAL_TABLET | Freq: Four times a day (QID) | ORAL | Status: DC | PRN
  Administered 2014-09-09 (×2): 600 mg via ORAL
  Filled 2014-09-08 (×2): qty 1

## 2014-09-08 MED ORDER — DOCUSATE SODIUM 100 MG PO CAPS
100.0000 mg | ORAL_CAPSULE | Freq: Two times a day (BID) | ORAL | Status: DC
  Administered 2014-09-08 – 2014-09-09 (×2): 100 mg via ORAL
  Filled 2014-09-08 (×2): qty 1

## 2014-09-08 MED ORDER — FENTANYL CITRATE (PF) 100 MCG/2ML IJ SOLN
25.0000 ug | INTRAMUSCULAR | Status: DC | PRN
  Administered 2014-09-08 (×3): 25 ug via INTRAVENOUS

## 2014-09-08 MED ORDER — LACTATED RINGERS IV SOLN
INTRAVENOUS | Status: DC
  Administered 2014-09-08: via INTRAVENOUS

## 2014-09-08 MED ORDER — PROMETHAZINE HCL 25 MG/ML IJ SOLN
6.2500 mg | Freq: Once | INTRAMUSCULAR | Status: AC
  Administered 2014-09-08: 6.25 mg via INTRAVENOUS

## 2014-09-08 MED ORDER — LIDOCAINE HCL (PF) 1 % IJ SOLN
INTRAMUSCULAR | Status: AC
  Filled 2014-09-08: qty 5

## 2014-09-08 MED ORDER — SUCCINYLCHOLINE CHLORIDE 20 MG/ML IJ SOLN
INTRAMUSCULAR | Status: DC | PRN
  Administered 2014-09-08: 140 mg via INTRAVENOUS

## 2014-09-08 MED ORDER — PROPRANOLOL HCL ER 60 MG PO CP24
60.0000 mg | ORAL_CAPSULE | Freq: Every day | ORAL | Status: DC
  Filled 2014-09-08 (×3): qty 1

## 2014-09-08 MED ORDER — ALPRAZOLAM 0.5 MG PO TABS
0.5000 mg | ORAL_TABLET | Freq: Every day | ORAL | Status: DC | PRN
  Administered 2014-09-08: 0.25 mg via ORAL
  Filled 2014-09-08: qty 1

## 2014-09-08 MED ORDER — MIDAZOLAM HCL 2 MG/2ML IJ SOLN
INTRAMUSCULAR | Status: AC
  Filled 2014-09-08: qty 2

## 2014-09-08 SURGICAL SUPPLY — 78 items
APPLICATOR COTTON TIP 6IN STRL (MISCELLANEOUS) ×4 IMPLANT
BAG SPEC RTRVL LRG 6X4 10 (ENDOMECHANICALS)
BARRIER ADHS 3X4 INTERCEED (GAUZE/BANDAGES/DRESSINGS) ×4 IMPLANT
BRR ADH 4X3 ABS CNTRL BYND (GAUZE/BANDAGES/DRESSINGS) ×2
CABLE HIGH FREQUENCY MONO STRZ (ELECTRODE) IMPLANT
CATH FOLEY 3WAY  5CC 16FR (CATHETERS) ×2
CATH FOLEY 3WAY 5CC 16FR (CATHETERS) ×2 IMPLANT
CATH ROBINSON RED A/P 16FR (CATHETERS) ×4 IMPLANT
CLOTH BEACON ORANGE TIMEOUT ST (SAFETY) ×4 IMPLANT
CONT PATH 16OZ SNAP LID 3702 (MISCELLANEOUS) ×4 IMPLANT
COVER BACK TABLE 60X90IN (DRAPES) ×8 IMPLANT
COVER TIP SHEARS 8 DVNC (MISCELLANEOUS) ×2 IMPLANT
COVER TIP SHEARS 8MM DA VINCI (MISCELLANEOUS) ×2
DECANTER SPIKE VIAL GLASS SM (MISCELLANEOUS) ×10 IMPLANT
DRAPE WARM FLUID 44X44 (DRAPE) ×4 IMPLANT
DRSG COVADERM PLUS 2X2 (GAUZE/BANDAGES/DRESSINGS) ×8 IMPLANT
DRSG OPSITE POSTOP 3X4 (GAUZE/BANDAGES/DRESSINGS) ×3 IMPLANT
DURAPREP 26ML APPLICATOR (WOUND CARE) ×4 IMPLANT
ELECT REM PT RETURN 9FT ADLT (ELECTROSURGICAL) ×4
ELECTRODE REM PT RTRN 9FT ADLT (ELECTROSURGICAL) ×2 IMPLANT
GAUZE VASELINE 3X9 (GAUZE/BANDAGES/DRESSINGS) IMPLANT
GLOVE BIO SURGEON STRL SZ 6.5 (GLOVE) ×3 IMPLANT
GLOVE BIO SURGEON STRL SZ7 (GLOVE) ×4 IMPLANT
GLOVE BIO SURGEONS STRL SZ 6.5 (GLOVE) ×1
GLOVE BIOGEL PI IND STRL 7.0 (GLOVE) ×6 IMPLANT
GLOVE BIOGEL PI INDICATOR 7.0 (GLOVE) ×6
GOWN STRL REUS W/TWL LRG LVL3 (GOWN DISPOSABLE) ×12 IMPLANT
IV STOPCOCK 4 WAY 40  W/Y SET (IV SOLUTION)
IV STOPCOCK 4 WAY 40 W/Y SET (IV SOLUTION) IMPLANT
KIT ACCESSORY DA VINCI DISP (KITS) ×2
KIT ACCESSORY DVNC DISP (KITS) ×2 IMPLANT
LEGGING LITHOTOMY PAIR STRL (DRAPES) ×4 IMPLANT
LIQUID BAND (GAUZE/BANDAGES/DRESSINGS) ×5 IMPLANT
MANIPULATOR UTERINE 4.5 ZUMI (MISCELLANEOUS) IMPLANT
OCCLUDER COLPOPNEUMO (BALLOONS) IMPLANT
PACK LAPAROSCOPY BASIN (CUSTOM PROCEDURE TRAY) ×1 IMPLANT
PACK ROBOT WH (CUSTOM PROCEDURE TRAY) ×4 IMPLANT
PACK ROBOTIC GOWN (GOWN DISPOSABLE) ×4 IMPLANT
PAD POSITIONER PINK NONSTERILE (MISCELLANEOUS) ×4 IMPLANT
PAD PREP 24X48 CUFFED NSTRL (MISCELLANEOUS) ×8 IMPLANT
PENCIL BUTTON HOLSTER BLD 10FT (ELECTRODE) ×4 IMPLANT
POUCH ENDO CATCH II 15MM (MISCELLANEOUS) ×3 IMPLANT
POUCH SPECIMEN RETRIEVAL 10MM (ENDOMECHANICALS) IMPLANT
PROTECTOR NERVE ULNAR (MISCELLANEOUS) ×4 IMPLANT
SEALER TISSUE G2 CVD JAW 35 (ENDOMECHANICALS) IMPLANT
SEALER TISSUE G2 CVD JAW 45CM (ENDOMECHANICALS) IMPLANT
SET CYSTO W/LG BORE CLAMP LF (SET/KITS/TRAYS/PACK) IMPLANT
SET IRRIG TUBING LAPAROSCOPIC (IRRIGATION / IRRIGATOR) ×8 IMPLANT
SET TRI-LUMEN FLTR TB AIRSEAL (TUBING) ×3 IMPLANT
SLEEVE XCEL OPT CAN 5 100 (ENDOMECHANICALS) IMPLANT
SUT MNCRL AB 4-0 PS2 18 (SUTURE) ×8 IMPLANT
SUT VIC AB 0 CT1 27 (SUTURE) ×8
SUT VIC AB 0 CT1 27XBRD ANBCTR (SUTURE) ×4 IMPLANT
SUT VIC AB 4-0 PS2 27 (SUTURE) ×8 IMPLANT
SUT VICRYL 0 UR6 27IN ABS (SUTURE) ×8 IMPLANT
SUT VLOC 180 0 6IN GS21 (SUTURE) ×3 IMPLANT
SUT VLOC 180 0 9IN  GS21 (SUTURE) ×2
SUT VLOC 180 0 9IN GS21 (SUTURE) ×1 IMPLANT
SYR 50ML LL SCALE MARK (SYRINGE) ×4 IMPLANT
SYSTEM CONVERTIBLE TROCAR (TROCAR) IMPLANT
TIP RUMI ORANGE 6.7MMX12CM (TIP) ×3 IMPLANT
TIP UTERINE 5.1X6CM LAV DISP (MISCELLANEOUS) IMPLANT
TIP UTERINE 6.7X10CM GRN DISP (MISCELLANEOUS) IMPLANT
TIP UTERINE 6.7X6CM WHT DISP (MISCELLANEOUS) IMPLANT
TIP UTERINE 6.7X8CM BLUE DISP (MISCELLANEOUS) IMPLANT
TOWEL OR 17X24 6PK STRL BLUE (TOWEL DISPOSABLE) ×12 IMPLANT
TRAY FOLEY CATH SILVER 14FR (SET/KITS/TRAYS/PACK) ×4 IMPLANT
TROCAR 12M 150ML BLUNT (TROCAR) IMPLANT
TROCAR BALLN 12MMX100 BLUNT (TROCAR) ×4 IMPLANT
TROCAR BLADELESS 15MM (ENDOMECHANICALS) ×3 IMPLANT
TROCAR DISP BLADELESS 8 DVNC (TROCAR) ×2 IMPLANT
TROCAR DISP BLADELESS 8MM (TROCAR) ×2
TROCAR PORT AIRSEAL 5X120 (TROCAR) ×3 IMPLANT
TROCAR XCEL 12X100 BLDLESS (ENDOMECHANICALS) ×4 IMPLANT
TROCAR XCEL NON-BLD 11X100MML (ENDOMECHANICALS) IMPLANT
TROCAR XCEL NON-BLD 5MMX100MML (ENDOMECHANICALS) ×4 IMPLANT
WARMER LAPAROSCOPE (MISCELLANEOUS) ×4 IMPLANT
WATER STERILE IRR 1000ML POUR (IV SOLUTION) ×12 IMPLANT

## 2014-09-08 NOTE — Anesthesia Postprocedure Evaluation (Signed)
  Anesthesia Post-op Note  Patient: Marie Wilson  Procedure(s) Performed: Procedure(s): ROBOTIC ASSISTED TOTAL HYSTERECTOMY WITH BILATERAL SALPINGECTOMY WITH LYSIS OF ADHESIONS (Bilateral) Patient is awake and responsive. Pain and nausea are reasonably well controlled. Vital signs are stable and clinically acceptable. Oxygen saturation is clinically acceptable, her voice is weak, but there is no stridor, and she has clear lungs.  There are no apparent anesthetic complications at this time. Patient is ready for discharge, I will initiate the OSA protocol post-op

## 2014-09-08 NOTE — Transfer of Care (Signed)
Immediate Anesthesia Transfer of Care Note  Patient: Marie Wilson  Procedure(s) Performed: Procedure(s): ROBOTIC ASSISTED TOTAL HYSTERECTOMY WITH BILATERAL SALPINGECTOMY WITH LYSIS OF ADHESIONS (Bilateral)  Patient Location: PACU  Anesthesia Type:General  Level of Consciousness: awake and sedated  Airway & Oxygen Therapy: Patient Spontanous Breathing and Patient connected to nasal cannula oxygen  Post-op Assessment: Report given to RN and Post -op Vital signs reviewed and stable  Post vital signs: stable  Last Vitals:  Filed Vitals:   09/08/14 0702  BP: 126/79  Pulse: 67  Temp: 36.9 C  Resp: 18    Complications: No apparent anesthesia complications

## 2014-09-08 NOTE — Anesthesia Procedure Notes (Signed)
Procedure Name: Intubation Date/Time: 09/08/2014 8:34 AM Performed by: Bufford Spikes Pre-anesthesia Checklist: Patient identified, Timeout performed, Emergency Drugs available, Suction available and Patient being monitored Patient Re-evaluated:Patient Re-evaluated prior to inductionOxygen Delivery Method: Circle system utilized Preoxygenation: Pre-oxygenation with 100% oxygen (3 full minutes) Intubation Type: IV induction Ventilation: Mask ventilation without difficulty Laryngoscope Size: Miller and 2 Grade View: Grade II Tube type: Oral Tube size: 7.0 mm Number of attempts: 1 Airway Equipment and Method: Patient positioned with wedge pillow and Stylet Placement Confirmation: ETT inserted through vocal cords under direct vision,  positive ETCO2 and breath sounds checked- equal and bilateral Secured at: 21 cm Tube secured with: Tape Dental Injury: Teeth and Oropharynx as per pre-operative assessment

## 2014-09-08 NOTE — Progress Notes (Signed)
09/08/2014 at 8:14 am  Patient seen and informed consent signed. No change reported by patient x last visit at [facility] office. Hb 8.5 Consent for Blood transfusion signed.  Princess Bruins MD

## 2014-09-08 NOTE — Op Note (Signed)
09/08/2014  3:17 PM  PATIENT:  Marie Wilson  49 y.o. female  PRE-OPERATIVE DIAGNOSIS:  Menorrhagia, Uterine Fibroids, Pelvic Pain  POST-OPERATIVE DIAGNOSIS:  Menorrhagia, Uterine fibroids, Pelvic Pain, Severe Adhesions  PROCEDURE:  Procedure(s): ROBOTIC ASSISTED TOTAL HYSTERECTOMY WITH BILATERAL SALPINGECTOMY WITH LYSIS OF ADHESIONS  SURGEON:  Surgeon(s): Princess Bruins, MD  ASSISTANTS: Hester Mates   ANESTHESIA:   general   PROCEDURE:  Under general anesthesia with endotracheal intubation, the patient is in lithotomy position.  She was prepped with Chloro-Prep on the abdomen and with Betadine on the suprapubic, vulvar and vaginal areas.  She was draped as usual.  A #12 Rumi with the medium Koh-ring were put in place easily.  The Foley was inserted in the bladder.  Abdominally, a supra-umbilical incision was made with the scalpel over 2 cm after infiltration of Marcaine one quarter plain.  The aponeurosis was opened under direct vision with Mayo scissors.  The parietal peritoneum was opened bluntly with a finger.  A pursestring stitch of Vicryls 0 was done on the aponeurosis. The Sheryle Hail was inserted at that level and up pneumoperitoneum was created with CO2.  The camera was inserted for inspection of the abdominal pelvic cavity.  A large omental adhesion was present at the anterior abdominal wall below the umbilicus.  No adhesion was present where port placement was planned.  We used a semicircular configuration for port placement with 2 robotic ports on the right and 1 robotic port on the lower left. A 5 mm assistant port was inserted in the upper left. Marcaine one quarter plane was infiltrated at all sites and incisions were made with the scalpel. Ports were inserted under direct vision.  The robot was docked from the right side. Roboti instruments were inserted under direct vision with the EndoShears scissor in the first arm, the PK in the second arm and the prograsp in the third arm.   We then went to the console.        The omental adhesion with the anterior abdominal wall was lysed.  Bowel adhesions were present at the left adnexa.  Those were also lysed.  Both ureters were visualized in normal anatomic position with good peristalsis.  The bladder was severely adherent to the uterus anteriorly probably secondary to a previous C-section.  We cauterized and sectioned the left round ligament. We then cauterized and sectioned the left utero-ovarian ligament. The distal part of the left tube was absent, we removed the left tube which extended about 3 cm from the cornua.  We then went to the right side and cauterized then sectioned the right round ligament, the right utero-ovarian ligament and the right mesosalpinx just under the right tube.  The right tube was completely normal in appearance.       At this point, given that the patient had significant anemia with a hemoglobin at 8.5 and the difficulty of the case was increased by the adhesions and the large size of the uterus, the decision was made to proceed with packed red blood cell transfusion 2 units.      We then proceeded with lysis of adhesions between the bladder and the anterior aspect of the uterus.  The bladder was filled with 200 cc to localize its upper border more precisely and avoid trauma.  We successfully descended the bladder well past the [place].  A small cyst was present at the anterior right side of the cervix compatible with a Nabothian cyst, it was cauterized.  We then cauterized  and sectioned the right and left uterine arteries.  Finally, the superior aspect of the vagina was sectioned with the tip of the EndoShears scissor above the Koe-ring.  The uterus was completely detached with the cervix and the tubes.  Given the large size of the uterus and the anatomy of the patient, that is a long vagina with obesity, it was not safely possible to extract the uterus and the fibroids vaginally.  The occluder was therefore put  in place and the vagina and we proceeded with closure of the vaginal vault with the robot.  The instruments were changed to the cutting needle driver in the first arm, the long tip in the second arm and the PK in the third arm.  We used V LOC 0 to close the vaginal vault.  First a small vaginal tear was repaired with a V lock 0, 6 inches in a running suture.  We then closed the vaginal vault with a running suture of the lock 0, 9 inches, starting at the right angle and all the way to the left angle and then back for 2 additional bites.  The 2 sutures were parked on the left abdominal wall.  Hemostasis was completed where necessary with the PK.  Both ureters demonstrated good peristalsis.  We removed all robotic instruments.  The robot was undocked.  We went to laparoscopy time.  The 2 needles were removed from the abdomen using the 8 mm camera.      The supra umbilical port was changed to a 12 mm to allow insertion of the manual extraction bag.  The uterus with the cervix and both tubes were inserted in the bag in one piece.  The port was then removed and the bag was brought to the skin.  The incision was extended by 1 cm on each side.  We added a small Alexis for better opening of the incision.  We then used a semicircular extraction technique with the scalpel to remove the specimen.  The specimen was sent to pathology. The extraction bag was then removed and was shown to be intact with no leak.  The supra umbilical incision was closed with figure-of-eight's using Vicryl 0.  The adipose tissue was reapproximated with a circular suture of Vicryl 0.  A subcuticular suture of Vicryl 4-0 closed the skin.        We then created a pneumoperitoneum again with CO2 and confirmed good hemostasis at all levels.  We irrigated and suctioned the abdominal pelvic cavity.  The laparoscopy instruments were then removed. The CO2 was evacuated.  All ports were removed under direct vision.  All skin incisions were closed with a  subcuticular suture of Vicryl 4-0.  Dermabond was added on all incisions. The occluder was removed from the vagina and good hemostasis was confirmed at that level as well.  The patient was brought to recovery room in good and stable status.  ESTIMATED BLOOD LOSS:  500 cc   Intake/Output Summary (Last 24 hours) at 09/08/14 1517 Last data filed at 09/08/14 1453  Gross per 24 hour  Intake   2370 ml  Output   1000 ml  Net   1370 ml     BLOOD ADMINISTERED: 2 Units PRBC   LOCAL MEDICATIONS USED:  MARCAINE     SPECIMEN:  Source of Specimen:  Uterus with cervix and both tubes (Distal left tube absent)  DISPOSITION OF SPECIMEN:  PATHOLOGY  COUNTS:  YES  PLAN OF CARE: Transfer to PACU  Princess Bruins MD 09/08/2014 at 3:18 pm

## 2014-09-09 ENCOUNTER — Encounter (HOSPITAL_COMMUNITY): Payer: Self-pay | Admitting: Obstetrics & Gynecology

## 2014-09-09 DIAGNOSIS — R102 Pelvic and perineal pain: Secondary | ICD-10-CM | POA: Diagnosis not present

## 2014-09-09 LAB — CBC
HCT: 27.6 % — ABNORMAL LOW (ref 36.0–46.0)
HEMOGLOBIN: 8.5 g/dL — AB (ref 12.0–15.0)
MCH: 27 pg (ref 26.0–34.0)
MCHC: 30.8 g/dL (ref 30.0–36.0)
MCV: 87.6 fL (ref 78.0–100.0)
PLATELETS: 253 10*3/uL (ref 150–400)
RBC: 3.15 MIL/uL — ABNORMAL LOW (ref 3.87–5.11)
RDW: 15.9 % — AB (ref 11.5–15.5)
WBC: 13.3 10*3/uL — ABNORMAL HIGH (ref 4.0–10.5)

## 2014-09-09 MED ORDER — PNEUMOCOCCAL VAC POLYVALENT 25 MCG/0.5ML IJ INJ
0.5000 mL | INJECTION | INTRAMUSCULAR | Status: AC
  Administered 2014-09-09: 0.5 mL via INTRAMUSCULAR
  Filled 2014-09-09: qty 0.5

## 2014-09-09 MED ORDER — OXYCODONE-ACETAMINOPHEN 7.5-325 MG PO TABS: 1.0000 | ORAL_TABLET | ORAL | Status: DC | PRN

## 2014-09-09 MED ORDER — LACTATED RINGERS IV BOLUS (SEPSIS)
500.0000 mL | Freq: Once | INTRAVENOUS | Status: AC
  Administered 2014-09-09: 08:00:00 via INTRAVENOUS

## 2014-09-09 NOTE — Anesthesia Postprocedure Evaluation (Signed)
  Anesthesia Post-op Note  Patient: Marie Wilson  Procedure(s) Performed: Procedure(s): ROBOTIC ASSISTED TOTAL HYSTERECTOMY WITH BILATERAL SALPINGECTOMY WITH LYSIS OF ADHESIONS (Bilateral)  Patient Location: [facility]  Anesthesia Type:General  Level of Consciousness: awake, alert , oriented and patient cooperative  Airway and Oxygen Therapy: Patient Spontanous Breathing  Post-op Pain: none  Post-op Assessment: Patient's Cardiovascular Status Stable, Respiratory Function Stable, Patent Airway, No signs of Nausea or vomiting, Adequate PO intake and Pain level controlled              Post-op Vital Signs: Reviewed and stable  Last Vitals:  Filed Vitals:   09/09/14 0544  BP: 108/58  Pulse: 69  Temp: 37.3 C  Resp: 18    Complications: No apparent anesthesia complications

## 2014-09-09 NOTE — Progress Notes (Signed)
Updated MD about patient's urinary output since foley catheter was removed. Ordered to bladder scan the patient, if she has an increased volume of urine in the bladder to I&O cath one time. If not encourage PO fluids. Bladder scanned at 0625, 23 ml scanned. Encouraged pt to drink water. Will continue to monitor

## 2014-09-09 NOTE — Progress Notes (Signed)
1 Day Post-Op Procedure(s) (LRB): ROBOTIC ASSISTED TOTAL HYSTERECTOMY WITH BILATERAL SALPINGECTOMY WITH LYSIS OF ADHESIONS (Bilateral)  Subjective: Patient reports that pain is well managed.  Tolerating normal diet as tolerated  diet without difficulty. No nausea / vomiting.  Ambulating and voiding.  Objective: BP 108/58 mmHg  Pulse 69  Temp(Src) 99.1 F (37.3 C) (Oral)  Resp 18  Ht 5\' 6"  (1.676 m)  Wt 302 lb (136.986 kg)  BMI 48.77 kg/m2  SpO2 99% Lungs: clear Heart: normal rate and rhythm Abdomen:soft and appropriately tender Extremities: Homans sign is negative, no sign of DVT Incision: healing well  Results for ASHLEYANNE, HEMMINGWAY (MRN [id number]) as of 09/09/2014 10:50  Ref. Range 09/09/2014 06:07  WBC Latest Ref Range: 4.0-10.5 K/uL 13.3 (H)  RBC Latest Ref Range: 3.87-5.11 MIL/uL 3.15 (L)  Hemoglobin Latest Ref Range: 12.0-15.0 g/dL 8.5 (L)  HCT Latest Ref Range: 36.0-46.0 % 27.6 (L)  MCV Latest Ref Range: 78.0-100.0 fL 87.6  MCH Latest Ref Range: 26.0-34.0 pg 27.0  MCHC Latest Ref Range: 30.0-36.0 g/dL 30.8  RDW Latest Ref Range: 11.5-15.5 % 15.9 (H)  Platelets Latest Ref Range: 150-400 K/uL 253      Assessment: s/p Procedure(s): ROBOTIC ASSISTED TOTAL HYSTERECTOMY WITH BILATERAL SALPINGECTOMY WITH LYSIS OF ADHESIONS: progressing well and anemia  Plan: Encourage ambulation Discharge home  Iron supplement      Vibhav Waddill,[name] 09/09/2014, 10:50 AM

## 2014-09-09 NOTE — Discharge Summary (Signed)
  Physician Discharge Summary  Patient ID: Marie Wilson MRN: [id number] DOB/AGE: 1965-04-25 49 y.o.  Admit date: 09/08/2014 Discharge date: 09/09/2014  Admission Diagnoses: Menorrhagia, Uterine Fibroids, Pelvic Pain, Anemia  Discharge Diagnoses: Menorrhagia, Uterine Fibroids, Pelvic Pain, Anemia, Abdo-pelvic Adhesions        Active Problems:   Postoperative state   Discharged Condition: good  Hospital Course: good  Consults: None  Treatments: surgery: Robotic Total Laparoscopic Hysterectomy, Bilateral Salpingectomy, Lysis of Adhesions.  Blood transfusion.  Disposition: Home     Medication List    STOP taking these medications        CRYSELLE-28 PO     oxyCODONE-acetaminophen 5-325 MG per tablet  Commonly known as:  PERCOCET/ROXICET  Replaced by:  oxyCODONE-acetaminophen 7.5-325 MG per tablet      TAKE these medications        ALPRAZolam 0.5 MG tablet  Commonly known as:  XANAX  Take 0.5 mg by mouth daily as needed for anxiety.     ferrous sulfate 325 (65 FE) MG tablet  Take 325 mg by mouth daily with breakfast.     ibuprofen 200 MG tablet  Commonly known as:  ADVIL,MOTRIN  Take 400-800 mg by mouth every 6 (six) hours as needed for cramping.     levothyroxine 88 MCG tablet  Commonly known as:  SYNTHROID, LEVOTHROID  Take 88 mcg by mouth daily before breakfast.     MULTIVITAMIN PO  Take 1 tablet by mouth daily.     oxyCODONE-acetaminophen 7.5-325 MG per tablet  Commonly known as:  PERCOCET  Take 1 tablet by mouth every 4 (four) hours as needed for severe pain.     propranolol ER 60 MG 24 hr capsule  Commonly known as:  INDERAL LA  Take 60 mg by mouth daily.     sertraline 100 MG tablet  Commonly known as:  ZOLOFT  Take 200 mg by mouth daily. Take 2 tablets by mouth daily.           Follow-up Information    Follow up with Shephanie Romas,[name], MD In 3 weeks.   Specialty:  Obstetrics and Gynecology   Contact information:   [address] [place] [address] [phone]       Signed: Princess Bruins, MD 09/09/2014, 1:31 PM

## 2014-09-09 NOTE — Discharge Instructions (Signed)
Total Laparoscopic Hysterectomy, Care After °Refer to this sheet in the next few weeks. These instructions provide you with information on caring for yourself after your procedure. Your health care provider may also give you more specific instructions. Your treatment has been planned according to current medical practices, but problems sometimes occur. Call your health care provider if you have any problems or questions after your procedure. °WHAT TO EXPECT AFTER THE PROCEDURE °· Pain and bruising at the incision sites. You will be given pain medicine to control it. °· Menopausal symptoms such as hot flashes, night sweats, and insomnia if your ovaries were removed. °· Sore throat from the breathing tube that was inserted during surgery. °HOME CARE INSTRUCTIONS °· Only take over-the-counter or prescription medicines for pain, discomfort, or fever as directed by your health care provider.   °· Do not take aspirin. It can cause bleeding.   °· Do not drive when taking pain medicine.   °· Follow your health care provider's advice regarding diet, exercise, lifting, driving, and general activities.   °· Resume your usual diet as directed and allowed.   °· Get plenty of rest and sleep.   °· Do not douche, use tampons, or have sexual intercourse for at least 6 weeks, or until your health care provider gives you permission.   °· Change your bandages (dressings) as directed by your health care provider.   °· Monitor your temperature and notify your health care provider of a fever.   °· Take showers instead of baths for 2-3 weeks.   °· Do not drink alcohol until your health care provider gives you permission.   °· If you develop constipation, you may take a mild laxative with your health care provider's permission. Bran foods may help with constipation problems. Drinking enough fluids to keep your urine clear or pale yellow may help as well.   °· Try to have someone home with you for 1-2 weeks to help around the house.    °· Keep all of your follow-up appointments as directed by your health care provider.   °SEEK MEDICAL CARE IF: °· You have swelling, redness, or increasing pain around your incision sites.   °· You have pus coming from your incision.   °· You notice a bad smell coming from your incision.   °· Your incision breaks open.   °· You feel dizzy or lightheaded.   °· You have pain or bleeding when you urinate.   °· You have persistent diarrhea.   °· You have persistent nausea and vomiting.   °· You have abnormal vaginal discharge.   °· You have a rash.   °· You have any type of abnormal reaction or develop an allergy to your medicine.   °· You have poor pain control with your prescribed medicine.   °SEEK IMMEDIATE MEDICAL CARE IF: °· You have chest pain or shortness of breath. °· You have severe abdominal pain that is not relieved with pain medicine. °· You have pain or swelling in your legs. °MAKE SURE YOU: °· Understand these instructions. °· Will watch your condition. °· Will get help right away if you are not doing well or get worse. °Document Released: 11/26/2012 Document Revised: 02/10/2013 Document Reviewed: 11/26/2012 °ExitCare® Patient Information ©2015 [employer]. This information is not intended to replace advice given to you by your health care provider. Make sure you discuss any questions you have with your health care provider. ° °

## 2014-09-09 NOTE — Progress Notes (Signed)
Ambulated out teaching complete  

## 2014-09-11 LAB — TYPE AND SCREEN
ABO/RH(D): O POS
ANTIBODY SCREEN: NEGATIVE
UNIT DIVISION: 0
UNIT DIVISION: 0
UNIT DIVISION: 0
Unit division: 0
Unit division: 0
Unit division: 0

## 2015-02-23 DIAGNOSIS — E039 Hypothyroidism, unspecified: Secondary | ICD-10-CM | POA: Diagnosis not present

## 2015-03-18 MED FILL — SYNTHROID 88 MCG TABLET: 88 | 30 days supply | Qty: 30 | Fill #0

## 2015-04-19 DIAGNOSIS — E039 Hypothyroidism, unspecified: Secondary | ICD-10-CM | POA: Diagnosis not present

## 2015-04-19 MED FILL — SYNTHROID 88 MCG TABLET: 88 | 90 days supply | Qty: 90 | Fill #0

## 2015-05-04 DIAGNOSIS — E119 Type 2 diabetes mellitus without complications: Secondary | ICD-10-CM | POA: Diagnosis not present

## 2015-05-04 DIAGNOSIS — E039 Hypothyroidism, unspecified: Secondary | ICD-10-CM | POA: Diagnosis not present

## 2015-05-04 DIAGNOSIS — E785 Hyperlipidemia, unspecified: Secondary | ICD-10-CM | POA: Diagnosis not present

## 2015-05-11 DIAGNOSIS — E039 Hypothyroidism, unspecified: Secondary | ICD-10-CM | POA: Diagnosis not present

## 2015-05-11 DIAGNOSIS — M5136 Other intervertebral disc degeneration, lumbar region: Secondary | ICD-10-CM | POA: Diagnosis not present

## 2015-05-11 DIAGNOSIS — M545 Low back pain: Secondary | ICD-10-CM | POA: Diagnosis not present

## 2015-05-11 DIAGNOSIS — E1165 Type 2 diabetes mellitus with hyperglycemia: Secondary | ICD-10-CM | POA: Diagnosis not present

## 2015-05-11 DIAGNOSIS — F419 Anxiety disorder, unspecified: Secondary | ICD-10-CM | POA: Diagnosis not present

## 2015-05-11 MED FILL — metFORMIN HCL 500 MG TABS: 500 | 90 days supply | Qty: 90 | Fill #0

## 2015-05-18 DIAGNOSIS — F330 Major depressive disorder, recurrent, mild: Secondary | ICD-10-CM | POA: Diagnosis not present

## 2015-05-18 DIAGNOSIS — F411 Generalized anxiety disorder: Secondary | ICD-10-CM | POA: Diagnosis not present

## 2015-05-19 MED FILL — PROPRANOLOL ER 60 MG CAP: 60 | 90 days supply | Qty: 90 | Fill #0

## 2015-05-19 MED FILL — ALPRAZolam 0.5 MG TABS: 0.5 | 90 days supply | Qty: 90 | Fill #0

## 2015-05-19 MED FILL — SERTRALINE HCL 100 MG TAB: 100 | 90 days supply | Qty: 180 | Fill #0

## 2015-08-15 DIAGNOSIS — H524 Presbyopia: Secondary | ICD-10-CM | POA: Diagnosis not present

## 2015-08-26 MED FILL — PROPRANOLOL ER 60 MG CAP: 60 | 90 days supply | Qty: 90 | Fill #1

## 2015-08-26 MED FILL — SERTRALINE HCL 100 MG TAB: 100 | 90 days supply | Qty: 180 | Fill #1

## 2015-09-16 MED FILL — SYNTHROID 88 MCG TABLET: 88 | 90 days supply | Qty: 90 | Fill #1

## 2015-10-07 ENCOUNTER — Other Ambulatory Visit: Payer: Self-pay | Admitting: Obstetrics & Gynecology

## 2015-10-07 DIAGNOSIS — Z1231 Encounter for screening mammogram for malignant neoplasm of breast: Secondary | ICD-10-CM

## 2015-10-13 ENCOUNTER — Ambulatory Visit
Admission: RE | Admit: 2015-10-13 | Discharge: 2015-10-13 | Disposition: A | Discharge status: Home / Self Care | Payer: 59 | Source: Ambulatory Visit | Attending: Obstetrics & Gynecology | Admitting: Obstetrics & Gynecology

## 2015-10-13 DIAGNOSIS — Z1231 Encounter for screening mammogram for malignant neoplasm of breast: Secondary | ICD-10-CM

## 2015-10-14 DIAGNOSIS — M722 Plantar fascial fibromatosis: Secondary | ICD-10-CM | POA: Diagnosis not present

## 2015-10-17 ENCOUNTER — Other Ambulatory Visit: Payer: Self-pay | Admitting: Obstetrics & Gynecology

## 2015-10-17 DIAGNOSIS — R928 Other abnormal and inconclusive findings on diagnostic imaging of breast: Secondary | ICD-10-CM

## 2015-10-19 ENCOUNTER — Ambulatory Visit
Admission: RE | Admit: 2015-10-19 | Discharge: 2015-10-19 | Disposition: A | Discharge status: Home / Self Care | Payer: 59 | Source: Ambulatory Visit | Attending: Obstetrics & Gynecology | Admitting: Obstetrics & Gynecology

## 2015-10-19 ENCOUNTER — Other Ambulatory Visit: Payer: Self-pay | Admitting: Obstetrics & Gynecology

## 2015-10-19 DIAGNOSIS — R928 Other abnormal and inconclusive findings on diagnostic imaging of breast: Secondary | ICD-10-CM

## 2015-10-19 DIAGNOSIS — R921 Mammographic calcification found on diagnostic imaging of breast: Secondary | ICD-10-CM

## 2015-10-21 HISTORY — PX: BREAST BIOPSY: SHX20

## 2015-10-26 ENCOUNTER — Other Ambulatory Visit: Payer: Self-pay | Admitting: Obstetrics & Gynecology

## 2015-10-26 DIAGNOSIS — R921 Mammographic calcification found on diagnostic imaging of breast: Secondary | ICD-10-CM

## 2015-10-27 ENCOUNTER — Ambulatory Visit
Admission: RE | Admit: 2015-10-27 | Discharge: 2015-10-27 | Disposition: A | Discharge status: Home / Self Care | Payer: 59 | Source: Ambulatory Visit | Attending: Obstetrics & Gynecology | Admitting: Obstetrics & Gynecology

## 2015-10-27 DIAGNOSIS — C50911 Malignant neoplasm of unspecified site of right female breast: Secondary | ICD-10-CM | POA: Diagnosis not present

## 2015-10-27 DIAGNOSIS — R921 Mammographic calcification found on diagnostic imaging of breast: Secondary | ICD-10-CM

## 2015-10-28 ENCOUNTER — Telehealth: Payer: Self-pay | Admitting: *Deleted

## 2015-10-28 ENCOUNTER — Encounter: Payer: Self-pay | Admitting: *Deleted

## 2015-10-28 ENCOUNTER — Other Ambulatory Visit: Payer: Self-pay | Admitting: Obstetrics & Gynecology

## 2015-10-28 DIAGNOSIS — R2231 Localized swelling, mass and lump, right upper limb: Secondary | ICD-10-CM

## 2015-10-28 DIAGNOSIS — C50211 Malignant neoplasm of upper-inner quadrant of right female breast: Secondary | ICD-10-CM | POA: Insufficient documentation

## 2015-10-28 DIAGNOSIS — C50911 Malignant neoplasm of unspecified site of right female breast: Secondary | ICD-10-CM

## 2015-10-28 HISTORY — DX: Malignant neoplasm of upper-inner quadrant of right female breast: C50.211

## 2015-10-28 NOTE — Telephone Encounter (Signed)
Confirmed [facility] for 11/02/15 at 0815 .  Instructions and contact information given.  

## 2015-10-31 ENCOUNTER — Ambulatory Visit
Admission: RE | Admit: 2015-10-31 | Discharge: 2015-10-31 | Disposition: A | Discharge status: Home / Self Care | Payer: 59 | Source: Ambulatory Visit | Attending: Obstetrics & Gynecology | Admitting: Obstetrics & Gynecology

## 2015-10-31 DIAGNOSIS — C50911 Malignant neoplasm of unspecified site of right female breast: Secondary | ICD-10-CM

## 2015-10-31 DIAGNOSIS — N6489 Other specified disorders of breast: Secondary | ICD-10-CM | POA: Diagnosis not present

## 2015-10-31 DIAGNOSIS — R2231 Localized swelling, mass and lump, right upper limb: Secondary | ICD-10-CM

## 2015-11-02 ENCOUNTER — Other Ambulatory Visit (HOSPITAL_BASED_OUTPATIENT_CLINIC_OR_DEPARTMENT_OTHER): Payer: 59

## 2015-11-02 ENCOUNTER — Encounter: Payer: Self-pay | Admitting: Hematology and Oncology

## 2015-11-02 ENCOUNTER — Ambulatory Visit: Payer: 59 | Attending: Surgery | Admitting: Physical Therapy

## 2015-11-02 ENCOUNTER — Encounter: Payer: Self-pay | Admitting: *Deleted

## 2015-11-02 ENCOUNTER — Ambulatory Visit
Admission: RE | Admit: 2015-11-02 | Discharge: 2015-11-02 | Disposition: A | Discharge status: Home / Self Care | Payer: 59 | Source: Ambulatory Visit | Attending: Radiation Oncology | Admitting: Radiation Oncology

## 2015-11-02 ENCOUNTER — Ambulatory Visit (HOSPITAL_BASED_OUTPATIENT_CLINIC_OR_DEPARTMENT_OTHER): Payer: 59 | Admitting: Hematology and Oncology

## 2015-11-02 ENCOUNTER — Encounter: Payer: Self-pay | Admitting: Radiation Oncology

## 2015-11-02 ENCOUNTER — Encounter: Payer: Self-pay | Admitting: Physical Therapy

## 2015-11-02 DIAGNOSIS — R293 Abnormal posture: Secondary | ICD-10-CM | POA: Insufficient documentation

## 2015-11-02 DIAGNOSIS — C50111 Malignant neoplasm of central portion of right female breast: Secondary | ICD-10-CM | POA: Insufficient documentation

## 2015-11-02 DIAGNOSIS — C50211 Malignant neoplasm of upper-inner quadrant of right female breast: Secondary | ICD-10-CM

## 2015-11-02 DIAGNOSIS — Z803 Family history of malignant neoplasm of breast: Secondary | ICD-10-CM | POA: Diagnosis not present

## 2015-11-02 DIAGNOSIS — Z170 Estrogen receptor positive status [ER+]: Secondary | ICD-10-CM | POA: Diagnosis not present

## 2015-11-02 DIAGNOSIS — Z807 Family history of other malignant neoplasms of lymphoid, hematopoietic and related tissues: Secondary | ICD-10-CM

## 2015-11-02 DIAGNOSIS — C50911 Malignant neoplasm of unspecified site of right female breast: Secondary | ICD-10-CM | POA: Diagnosis not present

## 2015-11-02 LAB — CBC WITH DIFFERENTIAL/PLATELET
BASO%: 0.6 % (ref 0.0–2.0)
BASOS ABS: 0 10*3/uL (ref 0.0–0.1)
EOS ABS: 0.3 10*3/uL (ref 0.0–0.5)
EOS%: 4.3 % (ref 0.0–7.0)
HEMATOCRIT: 41.8 % (ref 34.8–46.6)
HEMOGLOBIN: 14.1 g/dL (ref 11.6–15.9)
LYMPH#: 1.8 10*3/uL (ref 0.9–3.3)
LYMPH%: 25.2 % (ref 14.0–49.7)
MCH: 29.1 pg (ref 25.1–34.0)
MCHC: 33.7 g/dL (ref 31.5–36.0)
MCV: 86.4 fL (ref 79.5–101.0)
MONO#: 0.3 10*3/uL (ref 0.1–0.9)
MONO%: 4.7 % (ref 0.0–14.0)
NEUT#: 4.6 10*3/uL (ref 1.5–6.5)
NEUT%: 65.2 % (ref 38.4–76.8)
PLATELETS: 274 10*3/uL (ref 145–400)
RBC: 4.84 10*6/uL (ref 3.70–5.45)
RDW: 13.3 % (ref 11.2–14.5)
WBC: 7.1 10*3/uL (ref 3.9–10.3)

## 2015-11-02 LAB — COMPREHENSIVE METABOLIC PANEL
ALBUMIN: 3.4 g/dL — AB (ref 3.5–5.0)
ALK PHOS: 90 U/L (ref 40–150)
ALT: 26 U/L (ref 0–55)
ANION GAP: 10 meq/L (ref 3–11)
AST: 21 U/L (ref 5–34)
BUN: 11.6 mg/dL (ref 7.0–26.0)
CALCIUM: 9.1 mg/dL (ref 8.4–10.4)
CO2: 23 mEq/L (ref 22–29)
Chloride: 106 mEq/L (ref 98–109)
Creatinine: 0.8 mg/dL (ref 0.6–1.1)
EGFR: 84 mL/min/{1.73_m2} — AB (ref >90)
Glucose: 126 mg/dl (ref 70–140)
POTASSIUM: 4 meq/L (ref 3.5–5.1)
Sodium: 139 mEq/L (ref 136–145)
Total Bilirubin: 0.44 mg/dL (ref 0.20–1.20)
Total Protein: 7.6 g/dL (ref 6.4–8.3)

## 2015-11-02 MED ORDER — TAMOXIFEN CITRATE 20 MG PO TABS: 20.0000 mg | ORAL_TABLET | Freq: Every day | ORAL | 3 refills | Status: DC

## 2015-11-02 MED FILL — TAMOXIFEN 20 MG TABLET: 20 | 90 days supply | Qty: 90 | Fill #0

## 2015-11-02 NOTE — Progress Notes (Signed)
[facility] CONSULT NOTE  Patient Care Team: Thressa Sheller, MD as PCP - General (Internal Medicine) Erroll Luna, MD as Consulting Physician (General Surgery) Nicholas Lose, MD as Consulting Physician (Hematology and Oncology) Gery Pray, MD as Consulting Physician (Radiation Oncology)  CHIEF COMPLAINTS/PURPOSE OF CONSULTATION:  Newly diagnosed breast cancer  HISTORY OF PRESENTING ILLNESS:  Marie Wilson 50 y.o. female is here because of recent diagnosis of right breast cancer. Patient had a routine screening mammogram that revealed calcification the right breast spanning 4 mm. Additional mammograms and ultrasound were performed. Biopsy of this mass came back as DCIS with a small focus of invasive ductal carcinoma that was ER/PR positive HER-2 negative with a Ki-67 of 2%. She was presented this morning in the multidisciplinary tumor board and she is here today to discuss treatment plan. 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  I reviewed her records extensively and collaborated the history with the patient.  SUMMARY OF ONCOLOGIC HISTORY:   Breast cancer of upper-inner quadrant of right female breast ([place])   10/27/2015 Initial Diagnosis    Screening detected right breast calcifications spanning 4 mm, biopsy intermediate grade DCIS with small focus of invasive ductal carcinoma ER 95%, PR 95%, HER-2 negative ratio 1.16, Ki-67 2%, T1a N0 stage IA      MEDICAL HISTORY:  Past Medical History:  Diagnosis Date  . Anxiety   . Breast cancer of upper-inner quadrant of right female breast ([place]) 10/28/2015  . Cancer ([place])    skin  . Diabetes mellitus without complication ([place])    diet controlled- recent A1C=5  . Hypothyroidism     SURGICAL HISTORY: Past Surgical History:  Procedure Laterality Date  . BARTHOLIN GLAND CYST EXCISION    . CESAREAN SECTION    . CHOLECYSTECTOMY    . ROBOTIC ASSISTED TOTAL HYSTERECTOMY Bilateral 09/08/2014   Procedure: ROBOTIC  ASSISTED TOTAL HYSTERECTOMY WITH BILATERAL SALPINGECTOMY WITH LYSIS OF ADHESIONS;  Surgeon: Princess Bruins, MD;  Location: [facility];  Service: Gynecology;  Laterality: Bilateral;  . skin growth      SOCIAL HISTORY: Social History   Social History  . Marital status: Single    Spouse name: N/A  . Number of children: N/A  . Years of education: N/A   Occupational History  . Not on file.   Social History Main Topics  . Smoking status: Never Smoker  . Smokeless tobacco: Never Used  . Alcohol use No  . Drug use: No  . Sexual activity: Not on file   Other Topics Concern  . Not on file   Social History Narrative  . No narrative on file    FAMILY HISTORY: Family History  Problem Relation Age of Onset  . Breast cancer Mother   . Breast cancer Sister   . Hodgkin's lymphoma Sister     ALLERGIES:  has No Known Allergies.  MEDICATIONS:  Current Outpatient Prescriptions  Medication Sig Dispense Refill  . ALPRAZolam (XANAX) 0.5 MG tablet Take 0.5 mg by mouth daily as needed for anxiety.    Marland Kitchen levothyroxine (SYNTHROID, LEVOTHROID) 88 MCG tablet Take 88 mcg by mouth daily before breakfast.    . loratadine (CLARITIN) 10 MG tablet Take 10 mg by mouth daily.    . propranolol ER (INDERAL LA) 60 MG 24 hr capsule Take 60 mg by mouth daily.    . sertraline (ZOLOFT) 100 MG tablet Take 200 mg by mouth daily. Take 2 tablets by mouth daily.    . ferrous sulfate  325 (65 FE) MG tablet Take 325 mg by mouth daily with breakfast.    . ibuprofen (ADVIL,MOTRIN) 200 MG tablet Take 400-800 mg by mouth every 6 (six) hours as needed for cramping.    . Multiple Vitamins-Minerals (MULTIVITAMIN PO) Take 1 tablet by mouth daily.    Marland Kitchen oxyCODONE-acetaminophen (PERCOCET) 7.5-325 MG per tablet Take 1 tablet by mouth every 4 (four) hours as needed for severe pain. (Patient not taking: Reported on 11/02/2015) 30 tablet 0  . tamoxifen (NOLVADEX) 20 MG tablet Take 1 tablet (20 mg total) by mouth daily. 90 tablet 3    No current facility-administered medications for this visit.     REVIEW OF SYSTEMS:   Constitutional: Denies fevers, chills or abnormal night sweats Eyes: Denies blurriness of vision, double vision or watery eyes Ears, nose, mouth, throat, and face: Denies mucositis or sore throat Respiratory: Denies cough, dyspnea or wheezes Cardiovascular: Denies palpitation, chest discomfort or lower extremity swelling Gastrointestinal:  Denies nausea, heartburn or change in bowel habits Skin: Denies abnormal skin rashes Lymphatics: Denies new lymphadenopathy or easy bruising Neurological:Denies numbness, tingling or new weaknesses Behavioral/Psych: Mood is stable, no new changes  Breast:  Denies any palpable lumps or discharge All other systems were reviewed with the patient and are negative.  PHYSICAL EXAMINATION: ECOG PERFORMANCE STATUS: 0 - Asymptomatic  Vitals:   11/02/15 0851  BP: 130/81  Pulse: 66  Resp: 20  Temp: 98.4 F (36.9 C)   Filed Weights   11/02/15 0851  Weight: (!) 332 lb 9.6 oz (150.9 kg)    GENERAL:alert, no distress and comfortable SKIN: skin color, texture, turgor are normal, no rashes or significant lesions EYES: normal, conjunctiva are pink and non-injected, sclera clear OROPHARYNX:no exudate, no erythema and lips, buccal mucosa, and tongue normal  NECK: supple, thyroid normal size, non-tender, without nodularity LYMPH:  no palpable lymphadenopathy in the cervical, axillary or inguinal LUNGS: clear to auscultation and percussion with normal breathing effort HEART: regular rate & rhythm and no murmurs and no lower extremity edema ABDOMEN:abdomen soft, non-tender and normal bowel sounds Musculoskeletal:no cyanosis of digits and no clubbing  PSYCH: alert & oriented x 3 with fluent speech NEURO: no focal motor/sensory deficits BREAST: No palpable nodules in breast. No palpable axillary or supraclavicular lymphadenopathy (exam performed in the presence of a  chaperone)   LABORATORY DATA:  I have reviewed the data as listed Lab Results  Component Value Date   WBC 7.1 11/02/2015   HGB 14.1 11/02/2015   HCT 41.8 11/02/2015   MCV 86.4 11/02/2015   PLT 274 11/02/2015   Lab Results  Component Value Date   NA 139 11/02/2015   K 4.0 11/02/2015   CL 109 09/07/2014   CO2 23 11/02/2015    RADIOGRAPHIC STUDIES: I have personally reviewed the radiological reports and agreed with the findings in the report.  ASSESSMENT AND PLAN:  Breast cancer of upper-inner quadrant of right female breast ([place]) 10/27/2015: Screening detected right breast calcifications spanning 4 mm, biopsy intermediate grade DCIS with small focus of invasive ductal carcinoma ER 95%, PR 95%, HER-2 negative ratio 1.16, Ki-67 2%, T1a N0 stage IA  Pathology and radiology counseling: Discussed with the patient, the details of pathology including the type of breast cancer,the clinical staging, the significance of ER, PR and HER-2/neu receptors and the implications for treatment. After reviewing the pathology in detail, we proceeded to discuss the different treatment options between surgery, radiation, chemotherapy, antiestrogen therapies.  Recommendation: 1. Breast conserving surgery 2.  followed by adjuvant radiation 3. Followed by antiestrogen therapy with tamoxifen 20 mg daily.  We recommended genetic testing and counseling. Because it could take a few weeks for the results to be obtained, I started her on tamoxifen neoadjuvantly.  Tamoxifen counseling: We discussed the risks and benefits of tamoxifen. These include but not limited to insomnia, hot flashes, mood changes, vaginal dryness, and weight gain. Although rare, serious side effects including risk of blood clots were also discussed. We strongly believe that the benefits far outweigh the risks. Patient understands these risks and consented to starting treatment. Planned treatment duration is 5 years.  Return to clinic after  surgery to discuss final pathology report.     All questions were answered. The patient knows to call the clinic with any problems, questions or concerns.    Rulon Eisenmenger, MD 11/02/15

## 2015-11-02 NOTE — Progress Notes (Signed)
Radiation Oncology         [phone]) [phone] ________________________________  Initial Outpatient Consultation  Name: Marie Wilson MRN: [id number]  Date: 11/02/2015  DOB: [birth year]  YY:[id number],LEXNT, MD  Thressa Sheller, MD   REFERRING PHYSICIAN: Thressa Sheller, MD   DIAGNOSIS: The encounter diagnosis was Breast cancer of upper-inner quadrant of right female breast ([place]).  Clinical, T1a Nx Mx, grade 1 invasive ductal carcinoma of the right breast (ER/PR +, HER2 -)  HISTORY OF PRESENT ILLNESS::Marie Wilson is a 50 y.o. female who had a screening mammogram on 10/14/15. This showed calcifications in the right breast.  Diagnostic mammogram of the right breast on 10/19/15 showed a 0.4 cm group of punctate and amorphous calcifications in the medial aspect of the right breast, posterior depth.  Biopsy of the right breast on 10/27/15 revealed grade 1 invasive ductal carcinoma and DCIS with calcifications (ER 95% positive, PR 95% positive, HER2 negative, Ki67 2%).  Of note, radiology recommended a bilateral breast MRI in light of her calculated lifetime risk of developing breast cancer possibly being greater than 20% per American Cancer Society guidelines.  The patient presents to multidisciplinary breast clinic to discuss treatment options for the management of her disease.  PREVIOUS RADIATION THERAPY: No  PAST MEDICAL HISTORY:  has a past medical history of Anxiety; Breast cancer of upper-inner quadrant of right female breast ([place]) (10/28/2015); Cancer ([place]); Diabetes mellitus without complication ([place]); and Hypothyroidism.    PAST SURGICAL HISTORY: Past Surgical History:  Procedure Laterality Date  . BARTHOLIN GLAND CYST EXCISION    . CESAREAN SECTION    . CHOLECYSTECTOMY    . ROBOTIC ASSISTED TOTAL HYSTERECTOMY Bilateral 09/08/2014   Procedure: ROBOTIC ASSISTED TOTAL HYSTERECTOMY WITH BILATERAL SALPINGECTOMY WITH LYSIS OF ADHESIONS;  Surgeon: Princess Bruins, MD;  Location: [facility];   Service: Gynecology;  Laterality: Bilateral;  . skin growth      FAMILY HISTORY: family history includes Breast cancer in her mother and sister; Hodgkin's lymphoma in her sister.  SOCIAL HISTORY:  reports that she has never smoked. She has never used smokeless tobacco. She reports that she does not drink alcohol or use drugs.   ALLERGIES: Review of patient's allergies indicates no known allergies.  MEDICATIONS:  Current Outpatient Prescriptions  Medication Sig Dispense Refill  . ALPRAZolam (XANAX) 0.5 MG tablet Take 0.5 mg by mouth daily as needed for anxiety.    . ferrous sulfate 325 (65 FE) MG tablet Take 325 mg by mouth daily with breakfast.    . ibuprofen (ADVIL,MOTRIN) 200 MG tablet Take 400-800 mg by mouth every 6 (six) hours as needed for cramping.    Marland Kitchen levothyroxine (SYNTHROID, LEVOTHROID) 88 MCG tablet Take 88 mcg by mouth daily before breakfast.    . loratadine (CLARITIN) 10 MG tablet Take 10 mg by mouth daily.    . Multiple Vitamins-Minerals (MULTIVITAMIN PO) Take 1 tablet by mouth daily.    Marland Kitchen oxyCODONE-acetaminophen (PERCOCET) 7.5-325 MG per tablet Take 1 tablet by mouth every 4 (four) hours as needed for severe pain. (Patient not taking: Reported on 11/02/2015) 30 tablet 0  . propranolol ER (INDERAL LA) 60 MG 24 hr capsule Take 60 mg by mouth daily.    . sertraline (ZOLOFT) 100 MG tablet Take 200 mg by mouth daily. Take 2 tablets by mouth daily.    . tamoxifen (NOLVADEX) 20 MG tablet Take 1 tablet (20 mg total) by mouth daily. 90 tablet 3   No current facility-administered medications for this encounter.  REVIEW OF SYSTEMS:  A 15 point review of systems is documented in the electronic medical record. This was obtained by the nursing staff. However, I reviewed this with the patient to discuss relevant findings and make appropriate changes.  Pertinent items noted in HPI and remainder of comprehensive ROS otherwise negative.   Gynecologic History  Age at first menstrual  period? 12  Are you still having periods? No  If you no longer have periods: Have you used hormone replacement? No Obstetric History:  How many children have you carried to term? 1 Your age at first live birth? 1  Pregnant now or trying to get pregnant? 12  Have you used birth control pills or hormone shots for contraception? Yes  If so, for how long (or approximate dates)? 10 yrs/ twenties Health Maintenance:  Have you ever had a colonoscopy? no  Have you ever had a bone density? No  Date of your last PAP smear? 13 months ago  The patient reports wearing glasses/contacts, sinus problems, back pain, anxiety, diabetes, and thyroid problems.   PHYSICAL EXAM:  Vitals with BMI 11/02/2015  Height _0   Weight 332 lbs 10 oz  BMI 33.5  Systolic 456  Diastolic 81  Pulse 66  Respirations 20  General: Alert and oriented, in no acute distress, Accompanied by sister on evaluation today  Neck: Neck is supple, no palpable cervical or supraclavicular lymphadenopathy. Heart: Regular in rate and rhythm with no murmurs, rubs, or gallops. Chest: Clear to auscultation bilaterally, with no rhonchi, wheezes, or rales. Abdomen: Soft, nontender, nondistended, with no rigidity or guarding. Extremities: No cyanosis or edema. Lymphatics: see Neck Exam Skin: No concerning lesions. Musculoskeletal: symmetric strength and muscle tone throughout. Psychiatric: Judgment and insight are intact. Affect is appropriate. Breast: Right breast biopsy site in the UIQ with some mild bruising in this area. No palpable mass in the breast, no nipple discharge/bleeding. Left breast no palpable mass, nipple discharge/bleeding. Both breasts are large and pendulous.  ECOG = 0  LABORATORY DATA:  Lab Results  Component Value Date   WBC 7.1 11/02/2015   HGB 14.1 11/02/2015   HCT 41.8 11/02/2015   MCV 86.4 11/02/2015   PLT 274 11/02/2015   NEUTROABS 4.6 11/02/2015   Lab Results  Component Value Date   NA 139  11/02/2015   K 4.0 11/02/2015   CL 109 09/07/2014   CO2 23 11/02/2015   GLUCOSE 126 11/02/2015   CREATININE 0.8 11/02/2015   CALCIUM 9.1 11/02/2015      RADIOGRAPHY: [place] Extrem Up Right Ltd  Result Date: 10/31/2015 CLINICAL DATA:  50 year old female presenting for ultrasound of the right axilla. The patient was recently diagnosed with grade 1 IDC and DCIS of the right breast. EXAM: ULTRASOUND RIGHT UPPER EXTREMITY LIMITED TECHNIQUE: Ultrasound examination of the upper extremity soft tissues was performed in the area of clinical concern. COMPARISON:  Prior exams. FINDINGS: Multiple normal-appearing lymph nodes are seen in the right axilla. No suspicious masses or areas of shadowing are identified. IMPRESSION: 1.  Normal right axillary lymph nodes on ultrasound. RECOMMENDATION: 1. Continue treatment plan. 2. Lifetime risk assessment for breast cancer according to the Tyrer-Cuzick model according to available information is 25.9%. Therefore, MRI of the bilateral breasts is recommended prior to definitive surgery. Additionally, per American Cancer Society guidelines, if the patient has a calculated lifetime risk of developing breast cancer of greater than 20%, annual screening MRI of the breasts would be recommended at the time of screening mammography. BI-RADS 6:  Known malignancy. Electronically Signed   By: Ammie Ferrier M.D.   On: 10/31/2015 11:47   Mm Digital Diagnostic Unilat R  Result Date: 10/27/2015 CLINICAL DATA:  Post stereotactic core needle biopsy of the right breast. EXAM: DIAGNOSTIC RIGHT MAMMOGRAM POST STEREOTACTIC BIOPSY COMPARISON:  Previous exam(s). FINDINGS: Mammographic images were obtained following stereotactic guided biopsy of medial right breast calcifications. X shaped tissue marker is seen within the biopsy site in the right breast upper inner quadrant, posterior depth, in appropriate position. IMPRESSION: Successful placement of post biopsy tissue marker within the right  breast, post stereotactic core needle biopsy. Final Assessment: Post Procedure Mammograms for Marker Placement Electronically Signed   By: Fidela Salisbury M.D.   On: 10/27/2015 09:40   Mm Digital Diagnostic Unilat R  Result Date: 10/19/2015 CLINICAL DATA:  Screening recall for right breast calcifications. The patient has history of breast cancer in her mother at age 27 and her sister at age 77. EXAM: DIGITAL DIAGNOSTIC RIGHT MAMMOGRAM WITH CAD COMPARISON:  Previous exam(s). ACR Breast Density Category b: There are scattered areas of fibroglandular density. FINDINGS: In the medial aspect of the right breast, posterior depth, there is a 4 mm group of punctate and amorphous calcifications. Mammographic images were processed with CAD. IMPRESSION: There is an indeterminate 4 mm group of calcifications in the medial right breast. RECOMMENDATION: 1. The option of follow-up versus biopsy of the right breast calcifications was discussed with the patient. Given her family history of breast cancer, the patient desires to proceed with biopsy. This has been scheduled for 10/27/2015 at 8:30 a.m. 2. Consider genetics counseling/testing for assessment of lifetime risk of breast cancer. Per American Cancer Society guidelines, if the patient has a calculated lifetime risk of developing breast cancer of greater than 20%, annual screening MRI of the breasts would be recommended at the time of screening mammography. I have discussed the findings and recommendations with the patient. Results were also provided in writing at the conclusion of the visit. If applicable, a reminder letter will be sent to the patient regarding the next appointment. BI-RADS CATEGORY  3: Probably benign finding(s) - short interval follow-up suggested. Electronically Signed   By: Ammie Ferrier M.D.   On: 10/19/2015 16:07   Mm Screening Breast Tomo Bilateral  Result Date: 10/14/2015 CLINICAL DATA:  Screening. EXAM: 2D DIGITAL SCREENING BILATERAL  MAMMOGRAM WITH CAD AND ADJUNCT TOMO COMPARISON:  Previous exam(s). ACR Breast Density Category b: There are scattered areas of fibroglandular density. FINDINGS: In the right breast, calcifications warrant further evaluation with magnified views. In the left breast, no findings suspicious for malignancy. Images were processed with CAD. IMPRESSION: Further evaluation is suggested for calcifications in the right breast. RECOMMENDATION: Diagnostic mammogram of the right breast. (Code:[id number]) The patient will be contacted regarding the findings, and additional imaging will be scheduled. BI-RADS CATEGORY  0: Incomplete. Need additional imaging evaluation and/or prior mammograms for comparison. Electronically Signed   By: Abelardo Diesel M.D.   On: 10/14/2015 12:37   Mm Rt Breast Bx W Loc Dev 1st Lesion Image Bx Spec Stereo Guide  Addendum Date: 10/28/2015   ADDENDUM REPORT: 10/28/2015 12:18 ADDENDUM: Pathology revealed grade I invasive ductal carcinoma and ductal carcinoma in situ with calcifications in the right breast. This was found to be concordant by Dr. Fidela Salisbury. Pathology results were discussed with the patient by telephone. The patient reported doing well after the biopsy with tenderness at the site. Post biopsy instructions and care were reviewed and questions were  answered. The patient was encouraged to call [facility] for any additional concerns. The patient was referred to the [facility] at the [facility] on November 02, 2015. A right axillary ultrasound is scheduled for Monday, October 31, 2015. Pathology results reported by Susa Raring RN, BSN on 10/28/2015. Electronically Signed   By: Fidela Salisbury M.D.   On: 10/28/2015 12:18   Result Date: 10/28/2015 CLINICAL DATA:  Right medial breast calcifications. EXAM: RIGHT BREAST STEREOTACTIC CORE NEEDLE BIOPSY COMPARISON:  Previous exams. FINDINGS: The patient and I  discussed the procedure of stereotactic-guided biopsy including benefits and alternatives. We discussed the high likelihood of a successful procedure. We discussed the risks of the procedure including infection, bleeding, tissue injury, clip migration, and inadequate sampling. Informed written consent was given. The usual time out protocol was performed immediately prior to the procedure. Using sterile technique and 1% Lidocaine as local anesthetic, under stereotactic guidance, a 9 gauge vacuum assisted device was used to perform core needle biopsy of calcifications in the medial right breast using a superior approach. Specimen radiograph was performed showing presence of calcifications. Specimens with calcifications are identified for pathology. At the conclusion of the procedure, a X shaped tissue marker clip was deployed into the biopsy cavity. Follow-up 2-view mammogram was performed and dictated separately. IMPRESSION: Stereotactic-guided biopsy of right medial breast calcifications. No apparent complications. Electronically Signed: By: Fidela Salisbury M.D. On: 10/27/2015 09:41      IMPRESSION: Clinical, T1a Nx Mx, grade 1 invasive ductal carcinoma of the right breast (ER/PR +, HER2 -)  The patient would be a good candidate for breast conservation with radiation therapy. The patient does have a family history of breast cancer in her mother and sister. She may wait results of genetic testing prior to deciding on surgical management (lumpectomy, mastectomy, or bilateral mastectomy if she has a deleterious gene). The patient would also be a good candidate for adjuvant hormonal therapy after she has completed radiation.  PLAN: The patient is scheduled to start taking neoadjuvant hormonal therapy today (Tamoxifen). Surgical approach would depend on the results of genetic testing. The patient would then return to radiation oncology to further discuss radiation treatment.    ------------------------------------------------  Blair Promise, PhD, MD  This document serves as a record of services personally performed by Gery Pray, MD. It was created on his behalf by Darcus Austin, a trained medical scribe. The creation of this record is based on the scribe's personal observations and the provider's statements to them. This document has been checked and approved by the attending provider.

## 2015-11-02 NOTE — Therapy (Signed)
[facility], [place], [address] Phone: [phone]   Fax:  [phone]  Physical Therapy Evaluation  Patient Details  Name: Marie Wilson MRN: [id number] Date of Birth: 1965-06-05 Referring Provider: Dr. Erroll Luna  Encounter Date: 11/02/2015      PT End of Session - 11/02/15 1133    Visit Number 1   Number of Visits 1   PT Start Time [phone]   PT Stop Time 4656  Also saw pt from 1030-1035 for a total of 29 minutes   PT Time Calculation (min) 24 min   Activity Tolerance Patient tolerated treatment well   Behavior During Therapy [facility] for tasks assessed/performed      Past Medical History:  Diagnosis Date  . Anxiety   . Breast cancer of upper-inner quadrant of right female breast ([place]) 10/28/2015  . Cancer ([place])    skin  . Diabetes mellitus without complication ([place])    diet controlled- recent A1C=5  . Hypothyroidism     Past Surgical History:  Procedure Laterality Date  . BARTHOLIN GLAND CYST EXCISION    . CESAREAN SECTION    . CHOLECYSTECTOMY    . ROBOTIC ASSISTED TOTAL HYSTERECTOMY Bilateral 09/08/2014   Procedure: ROBOTIC ASSISTED TOTAL HYSTERECTOMY WITH BILATERAL SALPINGECTOMY WITH LYSIS OF ADHESIONS;  Surgeon: Princess Bruins, MD;  Location: [facility];  Service: Gynecology;  Laterality: Bilateral;  . skin growth      There were no vitals filed for this visit.       Subjective Assessment - 11/02/15 1001    Subjective Patient reports she is here to be seen by her medical team for her newly diagnosed right breast cancer.   Patient is accompained by: Family member   Pertinent History Patient was diagnosed on 10/13/15 with right intermediate grade DCIS with a small area of invasive breast cancer.  It is centrally located, measures 4 mm, is ER/PR positive and HER2 negative, and has a Ki67 of 2%.   Patient Stated Goals Reduce lymphedema risk and learn post op shoulder ROM HEP   Currently in  Pain? Yes   Pain Score 6    Pain Location Back   Pain Orientation Mid;Lower   Pain Descriptors / Indicators Sharp   Pain Type Chronic pain   Pain Onset More than a month ago   Pain Frequency Intermittent   Aggravating Factors  Walking   Pain Relieving Factors Sitting   Multiple Pain Sites No            [facility] PT Assessment - 11/02/15 0001      Assessment   Medical Diagnosis Right breast cancer   Referring Provider Dr. Marcello Moores [place]   Onset Date/Surgical Date 10/13/15   Hand Dominance Right   Prior Therapy none     Precautions   Precautions Other (comment)   Precaution Comments Active breast cancer     Restrictions   Weight Bearing Restrictions No     Balance Screen   Has the patient fallen in the past 6 months No   Has the patient had a decrease in activity level because of a fear of falling?  No   Is the patient reluctant to leave their home because of a fear of falling?  No     Home Environment   Living Environment Private residence   Living Arrangements Children  82 y.o. son   Available Help at Discharge Family     Prior Function   Level of [place]  Vocation Full time employment   Museum/gallery curator   Leisure She does not exercise     Cognition   Overall Cognitive Status Within Functional Limits for tasks assessed     Posture/Postural Control   Posture/Postural Control Postural limitations   Postural Limitations Rounded Shoulders;Forward head     ROM / Strength   AROM / PROM / Strength AROM;Strength     AROM   AROM Assessment Site Shoulder;Cervical   Right/Left Shoulder Right;Left   Right Shoulder Extension 43 Degrees   Right Shoulder Flexion 143 Degrees   Right Shoulder ABduction 143 Degrees   Right Shoulder Internal Rotation 64 Degrees   Right Shoulder External Rotation 88 Degrees   Left Shoulder Extension 57 Degrees   Left Shoulder Flexion 152 Degrees   Left Shoulder ABduction 145 Degrees    Left Shoulder Internal Rotation 70 Degrees   Left Shoulder External Rotation 88 Degrees   Cervical Flexion WNL   Cervical Extension WNL   Cervical - Right Side Bend WNL   Cervical - Left Side Bend WNL   Cervical - Right Rotation WNL   Cervical - Left Rotation WNL     Strength   Overall Strength Within functional limits for tasks performed           LYMPHEDEMA/ONCOLOGY QUESTIONNAIRE - 11/02/15 1130      Type   Cancer Type Right breast cancer     Lymphedema Assessments   Lymphedema Assessments Upper extremities     Right Upper Extremity Lymphedema   10 cm Proximal to Olecranon Process 41.1 cm   Olecranon Process 31.9 cm   10 cm Proximal to Ulnar Styloid Process 19.2 cm   Just Proximal to Ulnar Styloid Process 18.3 cm   Across Hand at [employer] 19.5 cm   At [place] of 2nd Digit 6.7 cm     Left Upper Extremity Lymphedema   10 cm Proximal to Olecranon Process 41.8 cm   Olecranon Process 30.8 cm   10 cm Proximal to Ulnar Styloid Process 26.9 cm   Just Proximal to Ulnar Styloid Process 17.7 cm   Across Hand at [employer] 19.3 cm   At [place] of 2nd Digit 6.6 cm      Patient was instructed today in a home exercise program today for post op shoulder range of motion. These included active assist shoulder flexion in sitting, scapular retraction, wall walking with shoulder abduction, and hands behind head external rotation.  She was encouraged to do these twice a day, holding 3 seconds and repeating 5 times when permitted by her physician.         PT Education - 11/02/15 1132    Education provided Yes   Education Details Lymphedema risk reduction and post op shoulder ROM HEP   Person(s) Educated Patient;Spouse   Methods Explanation;Demonstration;Handout   Comprehension Returned demonstration;Verbalized understanding              Breast Clinic Goals - 11/02/15 1143      Patient will be able to verbalize understanding of pertinent lymphedema risk reduction  practices relevant to her diagnosis specifically related to skin care.   Time 1   Period Days   Status Achieved     Patient will be able to return demonstrate and/or verbalize understanding of the post-op home exercise program related to regaining shoulder range of motion.   Time 1   Period Days   Status Achieved     Patient will be  able to verbalize understanding of the importance of attending the postoperative After Breast Cancer Class for further lymphedema risk reduction education and therapeutic exercise.   Time 1   Period Days   Status Achieved              Plan - 11/02/15 1134    Clinical Impression Statement Patient was diagnosed on 10/13/15 with right intermediate grade DCIS with a small area of invasive breast cancer.  It is centrally located, measures 4 mm, is ER/PR positive and HER2 negative, and has a Ki67 of 2%.  Her multidisciplinary medical team met prior to her assessments to determine a recommended treatment plan.  She is planning to have a right lumpectomy and sentinel node biopsy followed by genetic testing, radiation, and anti-estrogen therapy.  She may benefit from post op PT to regain shoulder ROM and reduce lymphedema risk.  Due to her lack of comorbidities, her evaluation is of low complexity.   Rehab Potential Excellent   Clinical Impairments Affecting Rehab Potential none   PT Frequency One time visit   PT Treatment/Interventions Patient/family education;Therapeutic exercise   PT Next Visit Plan Will f/u after surgery to determine PT needs.   PT Home Exercise Plan Post op shoulder ROM HEP   Consulted and Agree with Plan of Care Patient;Family member/caregiver   Family Member Consulted Husband      Patient will benefit from skilled therapeutic intervention in order to improve the following deficits and impairments:  Decreased strength, Decreased knowledge of precautions, Pain, Impaired UE functional use, Decreased range of motion  Visit  Diagnosis: Cancer of central portion of female breast, right - Plan: PT plan of care cert/re-cert  Abnormal posture - Plan: PT plan of care cert/re-cert   Patient will follow up at outpatient cancer rehab if needed following surgery.  If the patient requires physical therapy at that time, a specific plan will be dictated and sent to the referring physician for approval. The patient was educated today on appropriate basic range of motion exercises to begin post operatively and the importance of attending the After Breast Cancer class following surgery.  Patient was educated today on lymphedema risk reduction practices as it pertains to recommendations that will benefit the patient immediately following surgery.  She verbalized good understanding.  No additional physical therapy is indicated at this time.      Problem List Patient Active Problem List   Diagnosis Date Noted  . Breast cancer of upper-inner quadrant of right female breast ([place]) 10/28/2015  . Postoperative state 09/08/2014    Annia Friendly, PT 11/02/15 11:47 AM  [place], [place], [address] Phone: [phone]   Fax:  [phone]  Name: Marie Wilson MRN: [id number] Date of Birth: 09-24-1965

## 2015-11-02 NOTE — Progress Notes (Signed)
Clinical Social Work [place] Psychosocial Distress Screening [place]  Patient completed distress screening protocol and scored a 10 on the Psychosocial Distress Thermometer which indicates severe distress. Clinical Social Worker met with patient and patients family in [facility] to assess for distress and other psychosocial needs. Patient stated she was feeling overwhelmed but felt "better" after meeting with the treatment team and getting more information on her treatment plan. CSW and patient discussed common feeling and emotions when being diagnosed with cancer, and the importance of support during treatment. CSW informed patient of the support team and support services at [facility], and patient was agreeable to an [employer] referral. CSW provided contact information and encouraged patient to call with any questions or concerns.  ONCBCN DISTRESS SCREENING 11/02/2015  Screening Type Initial Screening  Distress experienced in past week (1-10) 10  Emotional problem type Nervousness/Anxiety;Adjusting to illness;Isolation/feeling alone  Physical Problem type Sleep/insomnia  Physician notified of physical symptoms Yes  Referral to clinical psychology No  Referral to clinical social work Yes  Referral to dietition No  Referral to financial advocate No  Referral to support programs Yes  Referral to palliative care No     Johnnye Lana, MSW, LCSW, OSW-C Clinical Social Worker [place] [phone]

## 2015-11-02 NOTE — Patient Instructions (Signed)

## 2015-11-02 NOTE — Assessment & Plan Note (Signed)
10/27/2015: Screening detected right breast calcifications spanning 4 mm, biopsy intermediate grade DCIS with small focus of invasive ductal carcinoma ER 95%, PR 95%, HER-2 negative ratio 1.16, Ki-67 2%, T1a N0 stage IA  Pathology and radiology counseling: Discussed with the patient, the details of pathology including the type of breast cancer,the clinical staging, the significance of ER, PR and HER-2/neu receptors and the implications for treatment. After reviewing the pathology in detail, we proceeded to discuss the different treatment options between surgery, radiation, chemotherapy, antiestrogen therapies.  Recommendation: 1. Breast conserving surgery 2. followed by adjuvant radiation 3. Followed by antiestrogen therapy with tamoxifen 20 mg daily.  We recommended genetic testing and counseling. Because it could take a few weeks for the results to be obtained, I started her on tamoxifen neoadjuvantly.  Tamoxifen counseling: We discussed the risks and benefits of tamoxifen. These include but not limited to insomnia, hot flashes, mood changes, vaginal dryness, and weight gain. Although rare, serious side effects including risk of blood clots were also discussed. We strongly believe that the benefits far outweigh the risks. Patient understands these risks and consented to starting treatment. Planned treatment duration is 5 years.  Return to clinic after surgery to discuss final pathology report.

## 2015-11-03 DIAGNOSIS — Z6841 Body Mass Index (BMI) 40.0 and over, adult: Secondary | ICD-10-CM | POA: Diagnosis not present

## 2015-11-03 DIAGNOSIS — Z01419 Encounter for gynecological examination (general) (routine) without abnormal findings: Secondary | ICD-10-CM | POA: Diagnosis not present

## 2015-11-08 ENCOUNTER — Telehealth: Payer: Self-pay | Admitting: *Deleted

## 2015-11-08 NOTE — Telephone Encounter (Signed)
  Oncology Nurse Navigator Documentation  Navigator Location: [facility] (11/08/15 1100) Navigator Encounter Type: Telephone (Left vm to discuss [place] from 11/02/15 and to assess needs) (11/08/15 1100) Telephone: Lahoma Crocker Call;Clinic/[facility]C Follow-up (Contact information provided.) (11/08/15 1100)                                        Time Spent with Patient: 15 (11/08/15 1100)

## 2015-11-09 ENCOUNTER — Encounter: Payer: Self-pay | Admitting: Genetic Counselor

## 2015-11-09 ENCOUNTER — Ambulatory Visit (HOSPITAL_BASED_OUTPATIENT_CLINIC_OR_DEPARTMENT_OTHER): Payer: 59 | Admitting: Genetic Counselor

## 2015-11-09 ENCOUNTER — Other Ambulatory Visit: Payer: 59

## 2015-11-09 DIAGNOSIS — Z808 Family history of malignant neoplasm of other organs or systems: Secondary | ICD-10-CM | POA: Diagnosis not present

## 2015-11-09 DIAGNOSIS — Z807 Family history of other malignant neoplasms of lymphoid, hematopoietic and related tissues: Secondary | ICD-10-CM | POA: Diagnosis not present

## 2015-11-09 DIAGNOSIS — C50211 Malignant neoplasm of upper-inner quadrant of right female breast: Secondary | ICD-10-CM

## 2015-11-09 DIAGNOSIS — Z803 Family history of malignant neoplasm of breast: Secondary | ICD-10-CM | POA: Diagnosis not present

## 2015-11-09 DIAGNOSIS — Z315 Encounter for genetic counseling: Secondary | ICD-10-CM

## 2015-11-09 NOTE — Progress Notes (Signed)
REFERRING PROVIDER: Thressa Sheller, MD [address], [place], [place] [address]   Nicholas Lose, MD   PRIMARY PROVIDER:  Thressa Sheller, MD  PRIMARY REASON FOR VISIT:  1. Breast cancer of upper-inner quadrant of right female breast ([place])   2. Family history of breast cancer      HISTORY OF PRESENT ILLNESS:   Ms. Amoroso, a 50 y.o. female, was seen for a [facility] cancer genetics consultation at the request of Dr. Noah Delaine due to a personal and family history of cancer.  Ms. Malicoat presents to clinic today to discuss the possibility of a hereditary predisposition to cancer, genetic testing, and to further clarify her future cancer risks, as well as potential cancer risks for family members.   In September 2017, at the age of 55, Ms. Pinnix was diagnosed with invasive ductal carcinoma and DCIS of the right breast.  The tumor is ER+/PR+/Her2-. This will be treated with lumpectomy and radiation unless her genetics comes back indicating otherwise.  She reports that her sister has had BRCA testing and is negative.    CANCER HISTORY:    Breast cancer of upper-inner quadrant of right female breast ([place])   10/27/2015 Initial Diagnosis    Screening detected right breast calcifications spanning 4 mm, biopsy intermediate grade DCIS with small focus of invasive ductal carcinoma ER 95%, PR 95%, HER-2 negative ratio 1.16, Ki-67 2%, T1a N0 stage IA        HORMONAL RISK FACTORS:  Menarche was at age 78.  First live birth at age 3.  OCP use for approximately 10-12 years.  Ovaries intact: yes.  Hysterectomy: yes.  Menopausal status: perimenopausal.  HRT use: 0 years. Colonoscopy: no; not examined. Mammogram within the last year: yes. Number of breast biopsies: 1. Up to date with pelvic exams:  yes. Any excessive radiation exposure in the past:  no  Past Medical History:  Diagnosis Date  . Anxiety   . Breast cancer of upper-inner quadrant of right female breast ([place])  10/28/2015  . Cancer ([place])    skin  . Diabetes mellitus without complication ([place])    diet controlled- recent A1C=5  . Family history of breast cancer   . Hypothyroidism     Past Surgical History:  Procedure Laterality Date  . BARTHOLIN GLAND CYST EXCISION    . CESAREAN SECTION    . CHOLECYSTECTOMY    . ROBOTIC ASSISTED TOTAL HYSTERECTOMY Bilateral 09/08/2014   Procedure: ROBOTIC ASSISTED TOTAL HYSTERECTOMY WITH BILATERAL SALPINGECTOMY WITH LYSIS OF ADHESIONS;  Surgeon: Princess Bruins, MD;  Location: [facility];  Service: Gynecology;  Laterality: Bilateral;  . skin growth      Social History   Social History  . Marital status: Single    Spouse name: N/A  . Number of children: N/A  . Years of education: N/A   Social History Main Topics  . Smoking status: Never Smoker  . Smokeless tobacco: Never Used  . Alcohol use No  . Drug use: No  . Sexual activity: Not Asked   Other Topics Concern  . None   Social History Narrative  . None     FAMILY HISTORY:  We obtained a detailed, 4-generation family history.  Significant diagnoses are listed below: Family History  Problem Relation Age of Onset  . Breast cancer Mother 100  . Breast cancer Sister 28  . Hodgkin's lymphoma Sister     dx in her 47s  . Lung cancer Maternal Uncle   . Lung cancer Paternal Uncle   .  Breast cancer Cousin 87    maternal first cousin  . Thyroid cancer Cousin 68    maternal first cousin    The patient has one 55 YO son who is cancer free.  She ha two brothers and one sister.  Her sister was diagnosed with Hodgkin's Lymphoma in her 100's and then breast caner at 72.  The patient's mother was diagnosed with breast cancer at 7.  She had two sisters and four brothers.  One brother was a smoker and had lung cancer.  One maternal cousin had breast cancer around age 50 and another cousin had thyroid cancer.  The patient's father is alive at 40.  He had six siblings, none who had cancer.  There is no other  reported cancer history.  Patient's maternal ancestors are of [place] and [place] descent, and paternal ancestors are of [place] and [place] descent. There is no reported Ashkenazi Jewish ancestry. There is no known consanguinity.  GENETIC COUNSELING ASSESSMENT: AMMY LIENHARD is a 50 y.o. female with a personal and family history of breast cancer which is somewhat suggestive of a hereditary cancer syndrome and predisposition to cancer. We, therefore, discussed and recommended the following at today's visit.   DISCUSSION: We discussed that about 5-10% of breast cancer is hereditary with most cases due to BRCA mutations.  Other moderate risk genes can influence the risk for breast cancer including ATM, PALB2 and CHEK2.  We reviewed the characteristics, features and inheritance patterns of hereditary cancer syndromes. We also discussed genetic testing, including the appropriate family members to test, the process of testing, insurance coverage and turn-around-time for results. We discussed the implications of a negative, positive and/or variant of uncertain significant result. We recommended Ms. Casad pursue genetic testing for the Breast/Ovarian cancer gene panel. The Breast/Ovarian gene panel offered by [facility] includes sequencing and rearrangement analysis for the following 20 genes:  ATM, BARD1, BRCA1, BRCA2, BRIP1, CDH1, CHEK2, EPCAM, FANCC, MLH1, MSH2, MSH6, NBN, PALB2, PMS2, PTEN, RAD51C, RAD51D, TP53, and XRCC2.     Based on Ms. Carol's personal and family history of cancer, she meets medical criteria for genetic testing. Despite that she meets criteria, she may still have an out of pocket cost. We discussed that if her out of pocket cost for testing is over $100, the laboratory will call and confirm whether she wants to proceed with testing.  If the out of pocket cost of testing is less than $100 she will be billed by the genetic testing laboratory.   PLAN: After considering the risks, benefits, and  limitations, Ms. Siess  provided informed consent to pursue genetic testing and the blood sample was sent to [facility] for analysis of the Breast/Ovarian cancer panel. We have placed a RUSH on the results, which should hopefully decrease the time to complete them. Results should be available within approximately 2-3 weeks' time, at which point they will be disclosed by telephone to Ms. Izquierdo, as will any additional recommendations warranted by these results. Ms. Frechette will receive a summary of her genetic counseling visit and a copy of her results once available. This information will also be available in Epic. We encouraged Ms. Fromme to remain in contact with cancer genetics annually so that we can continuously update the family history and inform her of any changes in cancer genetics and testing that may be of benefit for her family. Ms. Hirschhorn questions were answered to her satisfaction today. Our contact information was provided should additional questions or concerns arise.  Lastly, we encouraged Ms. Preslar to remain in contact with cancer genetics annually so that we can continuously update the family history and inform her of any changes in cancer genetics and testing that may be of benefit for this family.   Ms.  Mccalister questions were answered to her satisfaction today. Our contact information was provided should additional questions or concerns arise. Thank you for the referral and allowing [place] to share in the care of your patient.   Daesean Lazarz P. Florene Glen, [place], [facility] Certified Genetic Counselor [email or web address] phone: [phone]  The patient was seen for a total of 60 minutes in face-to-face genetic counseling.  This patient was discussed with Drs. Magrinat, Lindi Adie and/or Burr Medico who agrees with the above.    _______________________________________________________________________ For Office Staff:  Number of people involved in session: 1 Was an Intern/ student involved  with case: yes [employer]

## 2015-11-10 DIAGNOSIS — Z853 Personal history of malignant neoplasm of breast: Secondary | ICD-10-CM | POA: Diagnosis not present

## 2015-11-10 DIAGNOSIS — Z803 Family history of malignant neoplasm of breast: Secondary | ICD-10-CM | POA: Diagnosis not present

## 2015-11-16 DIAGNOSIS — F330 Major depressive disorder, recurrent, mild: Secondary | ICD-10-CM | POA: Diagnosis not present

## 2015-11-16 DIAGNOSIS — F411 Generalized anxiety disorder: Secondary | ICD-10-CM | POA: Diagnosis not present

## 2015-11-17 MED FILL — VENLAFAXINE HCL ER 75 MG CA: 75 | 90 days supply | Qty: 180 | Fill #0

## 2015-11-17 MED FILL — ALPRAZolam 0.5 MG TABS: 0.5 | 90 days supply | Qty: 180 | Fill #0

## 2015-11-17 MED FILL — PROPRANOLOL ER 60 MG CAPSUL: 60 | 90 days supply | Qty: 90 | Fill #0

## 2015-11-21 ENCOUNTER — Telehealth: Payer: Self-pay | Admitting: Genetic Counselor

## 2015-11-21 ENCOUNTER — Ambulatory Visit: Payer: Self-pay | Admitting: Surgery

## 2015-11-21 DIAGNOSIS — Z170 Estrogen receptor positive status [ER+]: Principal | ICD-10-CM

## 2015-11-21 DIAGNOSIS — C50911 Malignant neoplasm of unspecified site of right female breast: Secondary | ICD-10-CM | POA: Diagnosis not present

## 2015-11-21 DIAGNOSIS — C50311 Malignant neoplasm of lower-inner quadrant of right female breast: Secondary | ICD-10-CM

## 2015-11-21 NOTE — Telephone Encounter (Signed)
Marie Wilson called wondering when results should be back.  She has an appointment with her surgeon today and was wondering whether she should r/s it as they were going to make plans based on her test results.  Called [facility] to learn what the TAT was going to be.  They indicated that it should be out in the next 24 hours. Revealed that information to [place].  She will call her surgeon and see if she can r/s the appointment.

## 2015-11-21 NOTE — H&P (Signed)
Rudi Heap. [place] 11/21/2015 2:14 PM Location: [facility] Patient #: [id number] DOB: 1965/11/12 Single / Language: Cleophus Molt / Race: White Female  History of Present Illness Marcello Moores A. Kharson Rasmusson MD; 11/21/2015 4:45 PM) Patient words: Patient returns for follow-up of her right breast cancer. She has not received her genetic screening but 1 to discuss her options today again. We spent 30 minutes discussing lumpectomy and sentinel lymph node mapping versus mastectomy and reconstruction. The pros and cons of each were discussed today. She would like to proceed with scheduling her right breast lumpectomy and sentinel lymph node mapping SINCE she is leaning in that direction.   Pt sent at the request of Dr Sondra Come for right breast cancer detected on screening mammogram. Pt has a family history of breast cancer. Pt denoes breast pain, mass or nipple discharge.   Mammogram shows a 4 mm mass medial right breast core bx proven DCIS/IDC ER+ PR + HER 2 Neu -  Pt desires genetic screening. .  The patient is a 50 year old female.   Allergies (April Staton, CMA; 11/21/2015 2:15 PM) No Known Drug Allergies 11/21/2015  Medication History (April Staton, CMA; 11/21/2015 2:15 PM) ALPRAZolam (0.5MG  Tablet, Oral) Active. Ferrous Sulfate (325 (65 Fe)MG Tablet, Oral) Active. Ibuprofen (200MG  Tablet, Oral) Active. Synthroid (88MCG Tablet, Oral) Active. Multivitamins/Minerals (Oral) Active. Percocet (7.5-325MG  Tablet, Oral) Active. Propranolol HCl ER (60MG  Capsule ER 24HR, Oral) Active. Zoloft (100MG  Tablet, Oral) Active. Medications Reconciled    Vitals (April Staton CMA; 11/21/2015 2:16 PM) 11/21/2015 2:15 PM Weight: 331 lb Height: 66in Height was reported by patient. Body Surface Area: 2.48 m Body Mass Index: 53.42 kg/m  Temp.: 97.85F(Oral)  Pulse: 63 (Regular)  P.OX: 95% (Room air) BP: 108/70 (Sitting, Left Arm, Standard)      Physical Exam (Lamonta Cypress A.  Jadence Kinlaw MD; 11/21/2015 4:45 PM)  General Mental Status-Alert. General Appearance-Consistent with stated age. Hydration-Well hydrated. Voice-Normal.  Neurologic Neurologic evaluation reveals -alert and oriented x 3 with no impairment of recent or remote memory. Mental Status-Normal.    Assessment & Plan (Adabella Stanis A. Aarin Bluett MD; 11/21/2015 4:45 PM)  BREAST CANCER, RIGHT (C50.911) Impression: Risk of lumpectomy include bleeding, infection, seroma, more surgery, use of seed/wire, wound care, cosmetic deformity and the need for other treatments, death , blood clots, death. Pt agrees to proceed. Risk of sentinel lymph node mapping include bleeding, infection, lymphedema, shoulder pain. stiffness, dye allergy. cosmetic deformity , blood clots, death, need for more surgery. Pt agres to proceed. pt desires lumpectomy and SLN mapping right  Current Plans You are being scheduled for surgery - Our schedulers will call you.  You should hear from our office's scheduling department within 5 working days about the location, date, and time of surgery. We try to make accommodations for patient's preferences in scheduling surgery, but sometimes the OR schedule or the surgeon's schedule prevents [place] from making those accommodations.  If you have not heard from our office [phone]) in 5 working days, call the office and ask for your surgeon's nurse.  If you have other questions about your diagnosis, plan, or surgery, call the office and ask for your surgeon's nurse.  We discussed the staging and pathophysiology of breast cancer. We discussed all of the different options for treatment for breast cancer including surgery, chemotherapy, radiation therapy, Herceptin, and antiestrogen therapy. We discussed a sentinel lymph node biopsy as she does not appear to having lymph node involvement right now. We discussed the performance of that with injection of radioactive tracer  and blue dye. We discussed  that she would have an incision underneath her axillary hairline. We discussed that there is a bout a 10-20% chance of having a positive node with a sentinel lymph node biopsy and we will await the permanent pathology to make any other first further decisions in terms of her treatment. One of these options might be to return to the operating room to perform an axillary lymph node dissection. We discussed about a 1-2% risk lifetime of chronic shoulder pain as well as lymphedema associated with a sentinel lymph node biopsy. We discussed the options for treatment of the breast cancer which included lumpectomy versus a mastectomy. We discussed the performance of the lumpectomy with a wire placement. We discussed a 10-20% chance of a positive margin requiring reexcision in the operating room. We also discussed that she may need radiation therapy or antiestrogen therapy or both if she undergoes lumpectomy. We discussed the mastectomy and the postoperative care for that as well. We discussed that there is no difference in her survival whether she undergoes lumpectomy with radiation therapy or antiestrogen therapy versus a mastectomy. There is a slight difference in the local recurrence rate being 3-5% with lumpectomy and about 1% with a mastectomy. We discussed the risks of operation including bleeding, infection, possible reoperation. She understands her further therapy will be based on what her stages at the time of her operation.  Pt Education - [facility] breast cancer surgery: discussed with patient and provided information. Pt Education - [facility] Breast Biopsy HCI: discussed with patient and provided information.

## 2015-11-22 ENCOUNTER — Telehealth: Payer: Self-pay | Admitting: *Deleted

## 2015-11-22 ENCOUNTER — Telehealth: Payer: Self-pay | Admitting: Genetic Counselor

## 2015-11-22 ENCOUNTER — Ambulatory Visit: Payer: Self-pay | Admitting: Genetic Counselor

## 2015-11-22 ENCOUNTER — Encounter: Payer: Self-pay | Admitting: Genetic Counselor

## 2015-11-22 DIAGNOSIS — Z1379 Encounter for other screening for genetic and chromosomal anomalies: Secondary | ICD-10-CM

## 2015-11-22 DIAGNOSIS — Z170 Estrogen receptor positive status [ER+]: Secondary | ICD-10-CM

## 2015-11-22 DIAGNOSIS — C50211 Malignant neoplasm of upper-inner quadrant of right female breast: Secondary | ICD-10-CM

## 2015-11-22 DIAGNOSIS — Z803 Family history of malignant neoplasm of breast: Secondary | ICD-10-CM

## 2015-11-22 NOTE — Telephone Encounter (Signed)
Discussed with pt that I would request Santiago Glad send her genetic testing results to her.  Informed pt that physician team has been notified of negative genetic results.

## 2015-11-22 NOTE — Telephone Encounter (Signed)
Revealed negative genetic testing.  Discussed that this is somewhat consistent with her sisters negative BRCA testing in the past, however, we do not know why the patient, her sister and mother all had breast cancer.  Explained that she should keep in contact with genetics so that if there are changes in the future we can let her know.

## 2015-11-22 NOTE — Progress Notes (Signed)
HPI: Marie Wilson was previously seen in the [place] clinic due to a personal and family history of cancer and concerns regarding a hereditary predisposition to cancer. Please refer to our prior cancer genetics clinic note for more information regarding Marie Wilson's medical, social and family histories, and our assessment and recommendations, at the time. Marie Wilson recent genetic test results were disclosed to her, as were recommendations warranted by these results. These results and recommendations are discussed in more detail below.  FAMILY HISTORY:  We obtained a detailed, 4-generation family history.  Significant diagnoses are listed below: Family History  Problem Relation Age of Onset  . Breast cancer Mother 53  . Breast cancer Sister 26  . Hodgkin's lymphoma Sister     dx in her 55s  . Lung cancer Maternal Uncle   . Lung cancer Paternal Uncle   . Breast cancer Cousin 94    maternal first cousin  . Thyroid cancer Cousin 3    maternal first cousin    The patient has one 76 YO son who is cancer free.  She ha two brothers and one sister.  Her sister was diagnosed with Hodgkin's Lymphoma in her 56's and then breast caner at 53.  The patient's mother was diagnosed with breast cancer at 20.  She had two sisters and four brothers.  One brother was a smoker and had lung cancer.  One maternal cousin had breast cancer around age 92 and another cousin had thyroid cancer.  The patient's father is alive at 20.  He had six siblings, none who had cancer.  There is no other reported cancer history.  Patient's maternal ancestors are of [place] and [place] descent, and paternal ancestors are of [place] and [place] descent. There is no reported Ashkenazi Jewish ancestry. There is no known consanguinity.  GENETIC TEST RESULTS: At the time of Marie Wilson's visit, we recommended she pursue genetic testing of the Breast/Ovarian cancer gene panel. The Breast/Ovarian gene panel offered by [facility]  includes sequencing and rearrangement analysis for the following 20 genes:  ATM, BARD1, BRCA1, BRCA2, BRIP1, CDH1, CHEK2, EPCAM, FANCC, MLH1, MSH2, MSH6, NBN, PALB2, PMS2, PTEN, RAD51C, RAD51D, TP53, and XRCC2.   The report date is November 21, 2015.  Genetic testing was normal, and did not reveal a deleterious mutation in these genes. The test report has been scanned into EPIC and is located under the Molecular Pathology section of the Results Review tab.   We discussed with Marie Wilson that since the current genetic testing is not perfect, it is possible there may be a gene mutation in one of these genes that current testing cannot detect, but that chance is small. We also discussed, that it is possible that another gene that has not yet been discovered, or that we have not yet tested, is responsible for the cancer diagnoses in the family, and it is, therefore, important to remain in touch with cancer genetics in the future so that we can continue to offer Marie Wilson the most up to date genetic testing.   CANCER SCREENING RECOMMENDATIONS:This result is reassuring and indicates that Marie Wilson likely does not have an increased risk for a future cancer due to a mutation in one of these genes. This normal test also suggests that Marie Wilson's cancer was most likely not due to an inherited predisposition associated with one of these genes.  Most cancers happen by chance and this negative test suggests that her cancer falls into this category.  We, therefore, recommended she continue to follow the cancer management and screening guidelines provided by her oncology and primary healthcare provider.   RECOMMENDATIONS FOR FAMILY MEMBERS: Women in this family might be at some increased risk of developing cancer, over the general population risk, simply due to the family history of cancer. We recommended women in this family have a yearly mammogram beginning at age 51, or 29 years younger than the earliest onset of  cancer, an annual clinical breast exam, and perform monthly breast self-exams. Women in this family should also have a gynecological exam as recommended by their primary provider. All family members should have a colonoscopy by age 51.  FOLLOW-UP: Lastly, we discussed with Marie Wilson that cancer genetics is a rapidly advancing field and it is possible that new genetic tests will be appropriate for her and/or her family members in the future. We encouraged her to remain in contact with cancer genetics on an annual basis so we can update her personal and family histories and let her know of advances in cancer genetics that may benefit this family.   Our contact number was provided. Ms. Kashani questions were answered to her satisfaction, and she knows she is welcome to call us at anytime with additional questions or concerns.   Roma Kayser, MS, [facility] Certified Genetic Counselor [email or web address]

## 2015-11-24 ENCOUNTER — Other Ambulatory Visit: Payer: Self-pay | Admitting: Surgery

## 2015-11-24 DIAGNOSIS — C50311 Malignant neoplasm of lower-inner quadrant of right female breast: Secondary | ICD-10-CM

## 2015-11-24 DIAGNOSIS — Z170 Estrogen receptor positive status [ER+]: Principal | ICD-10-CM

## 2015-12-01 ENCOUNTER — Encounter (HOSPITAL_BASED_OUTPATIENT_CLINIC_OR_DEPARTMENT_OTHER): Payer: Self-pay | Admitting: *Deleted

## 2015-12-02 NOTE — Progress Notes (Signed)
Anesthesia consult per Dr. Gifford Shave- BMI 51.8. Requested to move pt to main OR due to BMI.  Contacted Stephanie at Dr. Josetta Huddle office and states she will take care of it.

## 2015-12-07 ENCOUNTER — Encounter (HOSPITAL_BASED_OUTPATIENT_CLINIC_OR_DEPARTMENT_OTHER): Payer: Self-pay | Admitting: *Deleted

## 2015-12-07 ENCOUNTER — Ambulatory Visit
Admission: RE | Admit: 2015-12-07 | Discharge: 2015-12-07 | Disposition: A | Discharge status: Home / Self Care | Payer: 59 | Source: Ambulatory Visit | Attending: Surgery | Admitting: Surgery

## 2015-12-07 DIAGNOSIS — C50911 Malignant neoplasm of unspecified site of right female breast: Secondary | ICD-10-CM | POA: Diagnosis not present

## 2015-12-07 DIAGNOSIS — Z170 Estrogen receptor positive status [ER+]: Principal | ICD-10-CM

## 2015-12-07 DIAGNOSIS — C50311 Malignant neoplasm of lower-inner quadrant of right female breast: Secondary | ICD-10-CM

## 2015-12-07 NOTE — Progress Notes (Signed)
Spoke with pt for pre-op call. Pt denies cardiac history, chest pain or sob. 

## 2015-12-08 ENCOUNTER — Ambulatory Visit
Admission: RE | Admit: 2015-12-08 | Discharge: 2015-12-08 | Disposition: A | Discharge status: Home / Self Care | Payer: 59 | Source: Ambulatory Visit | Attending: Surgery | Admitting: Surgery

## 2015-12-08 ENCOUNTER — Ambulatory Visit (HOSPITAL_COMMUNITY)
Admission: RE | Admit: 2015-12-08 | Discharge: 2015-12-08 | Disposition: A | Discharge status: Home / Self Care | Payer: 59 | Source: Ambulatory Visit | Attending: Surgery | Admitting: Surgery

## 2015-12-08 ENCOUNTER — Encounter (HOSPITAL_COMMUNITY)
Admission: RE | Disposition: A | Discharge status: Home / Self Care | Payer: Self-pay | Source: Ambulatory Visit | Attending: Surgery

## 2015-12-08 ENCOUNTER — Encounter (HOSPITAL_COMMUNITY): Payer: Self-pay | Admitting: *Deleted

## 2015-12-08 ENCOUNTER — Ambulatory Visit (HOSPITAL_COMMUNITY): Payer: 59 | Admitting: Certified Registered"

## 2015-12-08 DIAGNOSIS — E039 Hypothyroidism, unspecified: Secondary | ICD-10-CM | POA: Insufficient documentation

## 2015-12-08 DIAGNOSIS — G8918 Other acute postprocedural pain: Secondary | ICD-10-CM | POA: Diagnosis not present

## 2015-12-08 DIAGNOSIS — C50911 Malignant neoplasm of unspecified site of right female breast: Secondary | ICD-10-CM | POA: Insufficient documentation

## 2015-12-08 DIAGNOSIS — F419 Anxiety disorder, unspecified: Secondary | ICD-10-CM | POA: Diagnosis not present

## 2015-12-08 DIAGNOSIS — Z6841 Body Mass Index (BMI) 40.0 and over, adult: Secondary | ICD-10-CM | POA: Diagnosis not present

## 2015-12-08 DIAGNOSIS — E119 Type 2 diabetes mellitus without complications: Secondary | ICD-10-CM | POA: Diagnosis not present

## 2015-12-08 DIAGNOSIS — C50311 Malignant neoplasm of lower-inner quadrant of right female breast: Secondary | ICD-10-CM

## 2015-12-08 DIAGNOSIS — Z170 Estrogen receptor positive status [ER+]: Secondary | ICD-10-CM

## 2015-12-08 DIAGNOSIS — R079 Chest pain, unspecified: Secondary | ICD-10-CM | POA: Diagnosis not present

## 2015-12-08 DIAGNOSIS — N6011 Diffuse cystic mastopathy of right breast: Secondary | ICD-10-CM | POA: Diagnosis not present

## 2015-12-08 DIAGNOSIS — C50211 Malignant neoplasm of upper-inner quadrant of right female breast: Secondary | ICD-10-CM | POA: Diagnosis not present

## 2015-12-08 HISTORY — PX: BREAST LUMPECTOMY WITH RADIOACTIVE SEED AND SENTINEL LYMPH NODE BIOPSY: SHX6550

## 2015-12-08 HISTORY — DX: Pneumonia, unspecified organism: J18.9

## 2015-12-08 HISTORY — DX: Anemia, unspecified: D64.9

## 2015-12-08 HISTORY — DX: Essential (primary) hypertension: I10

## 2015-12-08 LAB — BASIC METABOLIC PANEL
Anion gap: 8 (ref 5–15)
BUN: 9 mg/dL (ref 6–20)
CHLORIDE: 102 mmol/L (ref 101–111)
CO2: 25 mmol/L (ref 22–32)
CREATININE: 0.81 mg/dL (ref 0.44–1.00)
Calcium: 9.1 mg/dL (ref 8.9–10.3)
GFR calc Af Amer: >60 mL/min (ref >60)
GFR calc non Af Amer: >60 mL/min (ref >60)
GLUCOSE: 103 mg/dL — AB (ref 65–99)
POTASSIUM: 4 mmol/L (ref 3.5–5.1)
SODIUM: 135 mmol/L (ref 135–145)

## 2015-12-08 LAB — GLUCOSE, CAPILLARY
GLUCOSE-CAPILLARY: 111 mg/dL — AB (ref 65–99)
Glucose-Capillary: 95 mg/dL (ref 65–99)

## 2015-12-08 LAB — CBC
HEMATOCRIT: 42.7 % (ref 36.0–46.0)
Hemoglobin: 13.9 g/dL (ref 12.0–15.0)
MCH: 28.3 pg (ref 26.0–34.0)
MCHC: 32.6 g/dL (ref 30.0–36.0)
MCV: 86.8 fL (ref 78.0–100.0)
Platelets: 277 10*3/uL (ref 150–400)
RBC: 4.92 MIL/uL (ref 3.87–5.11)
RDW: 13.2 % (ref 11.5–15.5)
WBC: 8.1 10*3/uL (ref 4.0–10.5)

## 2015-12-08 SURGERY — BREAST LUMPECTOMY WITH RADIOACTIVE SEED AND SENTINEL LYMPH NODE BIOPSY
Anesthesia: Regional | Site: Breast | Laterality: Right

## 2015-12-08 MED ORDER — OXYCODONE-ACETAMINOPHEN 5-325 MG PO TABS
1.0000 | ORAL_TABLET | Freq: Once | ORAL | Status: AC
  Administered 2015-12-08: 1 via ORAL

## 2015-12-08 MED ORDER — ACETAMINOPHEN 500 MG PO TABS
ORAL_TABLET | ORAL | Status: AC
  Administered 2015-12-08: 1000 mg
  Filled 2015-12-08: qty 2

## 2015-12-08 MED ORDER — SUCCINYLCHOLINE CHLORIDE 200 MG/10ML IV SOSY
PREFILLED_SYRINGE | INTRAVENOUS | Status: DC | PRN
  Administered 2015-12-08: 160 mg via INTRAVENOUS

## 2015-12-08 MED ORDER — EPHEDRINE 5 MG/ML INJ
INTRAVENOUS | Status: AC
  Filled 2015-12-08: qty 20

## 2015-12-08 MED ORDER — PROPOFOL 10 MG/ML IV BOLUS
INTRAVENOUS | Status: DC | PRN
  Administered 2015-12-08: 150 mg via INTRAVENOUS
  Administered 2015-12-08: 50 mg via INTRAVENOUS

## 2015-12-08 MED ORDER — LIDOCAINE 2% (20 MG/ML) 5 ML SYRINGE
INTRAMUSCULAR | Status: AC
  Filled 2015-12-08: qty 10

## 2015-12-08 MED ORDER — DIPHENHYDRAMINE HCL 50 MG/ML IJ SOLN
INTRAMUSCULAR | Status: DC | PRN
  Administered 2015-12-08: 10 mg via INTRAVENOUS

## 2015-12-08 MED ORDER — EPHEDRINE SULFATE 50 MG/ML IJ SOLN
INTRAMUSCULAR | Status: DC | PRN
  Administered 2015-12-08: 10 mg via INTRAVENOUS

## 2015-12-08 MED ORDER — LIDOCAINE 2% (20 MG/ML) 5 ML SYRINGE
INTRAMUSCULAR | Status: DC | PRN
  Administered 2015-12-08: 100 mg via INTRAVENOUS

## 2015-12-08 MED ORDER — MIDAZOLAM HCL 2 MG/2ML IJ SOLN
INTRAMUSCULAR | Status: AC
  Administered 2015-12-08: 2 mg
  Filled 2015-12-08: qty 2

## 2015-12-08 MED ORDER — LIDOCAINE 2% (20 MG/ML) 5 ML SYRINGE
INTRAMUSCULAR | Status: AC
  Filled 2015-12-08: qty 5

## 2015-12-08 MED ORDER — GLYCOPYRROLATE 0.2 MG/ML IJ SOLN
INTRAMUSCULAR | Status: AC
  Administered 2015-12-08: 0.2 mg
  Filled 2015-12-08: qty 1

## 2015-12-08 MED ORDER — TECHNETIUM TC 99M SULFUR COLLOID FILTERED
1.0000 | Freq: Once | INTRAVENOUS | Status: AC | PRN
Start: 2015-12-08 — End: 2015-12-08
  Administered 2015-12-08: 1 via INTRADERMAL

## 2015-12-08 MED ORDER — SODIUM CHLORIDE 0.9 % IJ SOLN
INTRAVENOUS | Status: DC | PRN
  Administered 2015-12-08: 12:00:00

## 2015-12-08 MED ORDER — MIDAZOLAM HCL 2 MG/2ML IJ SOLN
INTRAMUSCULAR | Status: AC
  Filled 2015-12-08: qty 2

## 2015-12-08 MED ORDER — CELECOXIB 200 MG PO CAPS
ORAL_CAPSULE | ORAL | Status: AC
  Administered 2015-12-08: 400 mg
  Filled 2015-12-08: qty 2

## 2015-12-08 MED ORDER — ONDANSETRON HCL 4 MG/2ML IJ SOLN
INTRAMUSCULAR | Status: AC
  Filled 2015-12-08: qty 2

## 2015-12-08 MED ORDER — SUCCINYLCHOLINE CHLORIDE 200 MG/10ML IV SOSY
PREFILLED_SYRINGE | INTRAVENOUS | Status: AC
  Filled 2015-12-08: qty 10

## 2015-12-08 MED ORDER — DEXAMETHASONE SODIUM PHOSPHATE 10 MG/ML IJ SOLN
INTRAMUSCULAR | Status: DC | PRN
  Administered 2015-12-08: 4 mg via INTRAVENOUS

## 2015-12-08 MED ORDER — CELECOXIB 200 MG PO CAPS
ORAL_CAPSULE | ORAL | Status: AC
  Filled 2015-12-08: qty 2

## 2015-12-08 MED ORDER — LACTATED RINGERS IV SOLN
INTRAVENOUS | Status: DC
  Administered 2015-12-08 (×2): via INTRAVENOUS

## 2015-12-08 MED ORDER — METHYLENE BLUE 0.5 % INJ SOLN
INTRAVENOUS | Status: AC
  Filled 2015-12-08: qty 10

## 2015-12-08 MED ORDER — FENTANYL CITRATE (PF) 100 MCG/2ML IJ SOLN
25.0000 ug | INTRAMUSCULAR | Status: DC | PRN
  Administered 2015-12-08: 25 ug via INTRAVENOUS

## 2015-12-08 MED ORDER — GABAPENTIN 300 MG PO CAPS
ORAL_CAPSULE | ORAL | Status: AC
  Administered 2015-12-08: 300 mg
  Filled 2015-12-08: qty 1

## 2015-12-08 MED ORDER — DEXAMETHASONE SODIUM PHOSPHATE 10 MG/ML IJ SOLN
INTRAMUSCULAR | Status: AC
  Filled 2015-12-08: qty 1

## 2015-12-08 MED ORDER — FENTANYL CITRATE (PF) 100 MCG/2ML IJ SOLN
INTRAMUSCULAR | Status: AC
  Administered 2015-12-08: 25 ug via INTRAVENOUS
  Filled 2015-12-08: qty 2

## 2015-12-08 MED ORDER — PROPOFOL 10 MG/ML IV BOLUS
INTRAVENOUS | Status: AC
  Filled 2015-12-08: qty 20

## 2015-12-08 MED ORDER — FENTANYL CITRATE (PF) 100 MCG/2ML IJ SOLN
INTRAMUSCULAR | Status: AC
  Administered 2015-12-08 (×2): 50 ug
  Filled 2015-12-08: qty 2

## 2015-12-08 MED ORDER — HEMOSTATIC AGENTS (NO CHARGE) OPTIME
TOPICAL | Status: DC | PRN
  Administered 2015-12-08: 1 via TOPICAL

## 2015-12-08 MED ORDER — 0.9 % SODIUM CHLORIDE (POUR BTL) OPTIME
TOPICAL | Status: DC | PRN
  Administered 2015-12-08: 1000 mL

## 2015-12-08 MED ORDER — MIDAZOLAM HCL 5 MG/5ML IJ SOLN
INTRAMUSCULAR | Status: DC | PRN
Start: 2015-12-08 — End: 2015-12-08
  Administered 2015-12-08 (×2): 1 mg via INTRAVENOUS

## 2015-12-08 MED ORDER — CHLORHEXIDINE GLUCONATE CLOTH 2 % EX PADS: 6.0000 | MEDICATED_PAD | Freq: Once | CUTANEOUS | Status: DC

## 2015-12-08 MED ORDER — OXYCODONE-ACETAMINOPHEN 5-325 MG PO TABS
1.0000 | ORAL_TABLET | ORAL | 0 refills | Status: DC | PRN
Start: 2015-12-08 — End: 2016-01-31

## 2015-12-08 MED ORDER — OXYCODONE-ACETAMINOPHEN 5-325 MG PO TABS
ORAL_TABLET | ORAL | Status: AC
  Administered 2015-12-08: 1 via ORAL
  Filled 2015-12-08: qty 1

## 2015-12-08 MED ORDER — DEXTROSE 5 % IV SOLN
3.0000 g | INTRAVENOUS | Status: AC
  Administered 2015-12-08: 3 g via INTRAVENOUS
  Filled 2015-12-08: qty 3000

## 2015-12-08 MED ORDER — FENTANYL CITRATE (PF) 100 MCG/2ML IJ SOLN
INTRAMUSCULAR | Status: DC | PRN
  Administered 2015-12-08 (×2): 50 ug via INTRAVENOUS

## 2015-12-08 MED ORDER — BUPIVACAINE-EPINEPHRINE 0.25% -1:200000 IJ SOLN
INTRAMUSCULAR | Status: AC
  Filled 2015-12-08: qty 1

## 2015-12-08 MED ORDER — ARTIFICIAL TEARS OP OINT
TOPICAL_OINTMENT | OPHTHALMIC | Status: AC
  Filled 2015-12-08: qty 3.5

## 2015-12-08 MED ORDER — ONDANSETRON HCL 4 MG/2ML IJ SOLN
INTRAMUSCULAR | Status: DC | PRN
  Administered 2015-12-08: 4 mg via INTRAVENOUS

## 2015-12-08 MED ORDER — BUPIVACAINE-EPINEPHRINE (PF) 0.5% -1:200000 IJ SOLN
INTRAMUSCULAR | Status: DC | PRN
  Administered 2015-12-08: 30 mL via PERINEURAL

## 2015-12-08 MED ORDER — DIPHENHYDRAMINE HCL 50 MG/ML IJ SOLN
INTRAMUSCULAR | Status: AC
  Filled 2015-12-08: qty 1

## 2015-12-08 MED ORDER — FENTANYL CITRATE (PF) 100 MCG/2ML IJ SOLN
INTRAMUSCULAR | Status: AC
  Filled 2015-12-08: qty 2

## 2015-12-08 MED ORDER — BUPIVACAINE-EPINEPHRINE 0.25% -1:200000 IJ SOLN
INTRAMUSCULAR | Status: DC | PRN
  Administered 2015-12-08: 50 mL

## 2015-12-08 MED FILL — OXYCODONE/APAP 5-325: 5-325 | 4 days supply | Qty: 25 | Fill #0

## 2015-12-08 SURGICAL SUPPLY — 56 items
ADH SKN CLS APL DERMABOND .7 (GAUZE/BANDAGES/DRESSINGS) ×1
APPLIER CLIP 9.375 MED OPEN (MISCELLANEOUS) ×3
APR CLP MED 9.3 20 MLT OPN (MISCELLANEOUS) ×1
BINDER BREAST LRG (GAUZE/BANDAGES/DRESSINGS) IMPLANT
BINDER BREAST XLRG (GAUZE/BANDAGES/DRESSINGS) IMPLANT
BINDER BREAST XXLRG (GAUZE/BANDAGES/DRESSINGS) ×2 IMPLANT
BLADE SURG 15 STRL LF DISP TIS (BLADE) ×1 IMPLANT
BLADE SURG 15 STRL SS (BLADE) ×3
CANISTER SUCTION 2500CC (MISCELLANEOUS) ×3 IMPLANT
CHLORAPREP W/TINT 26ML (MISCELLANEOUS) ×3 IMPLANT
CLIP APPLIE 9.375 MED OPEN (MISCELLANEOUS) ×1 IMPLANT
CONT SPEC STER OR (MISCELLANEOUS) ×5 IMPLANT
COVER LIGHT HANDLE STERIS (MISCELLANEOUS) ×2 IMPLANT
COVER PROBE W GEL 5X96 (DRAPES) ×3 IMPLANT
DECANTER SPIKE VIAL GLASS SM (MISCELLANEOUS) ×2 IMPLANT
DERMABOND ADVANCED (GAUZE/BANDAGES/DRESSINGS) ×2
DERMABOND ADVANCED .7 DNX12 (GAUZE/BANDAGES/DRESSINGS) ×1 IMPLANT
DRAPE CHEST BREAST 15X10 FENES (DRAPES) ×3 IMPLANT
DRAPE UTILITY XL STRL (DRAPES) ×6 IMPLANT
ELECT CAUTERY BLADE 6.4 (BLADE) ×3 IMPLANT
ELECT REM PT RETURN 9FT ADLT (ELECTROSURGICAL) ×3
ELECTRODE REM PT RTRN 9FT ADLT (ELECTROSURGICAL) ×1 IMPLANT
GLOVE BIO SURGEON STRL SZ8 (GLOVE) ×3 IMPLANT
GLOVE BIOGEL PI IND STRL 6.5 (GLOVE) IMPLANT
GLOVE BIOGEL PI IND STRL 8 (GLOVE) ×1 IMPLANT
GLOVE BIOGEL PI INDICATOR 6.5 (GLOVE) ×2
GLOVE BIOGEL PI INDICATOR 8 (GLOVE) ×4
GLOVE SURG SS PI 6.0 STRL IVOR (GLOVE) ×2 IMPLANT
GOWN STRL REUS W/ TWL LRG LVL3 (GOWN DISPOSABLE) ×1 IMPLANT
GOWN STRL REUS W/ TWL XL LVL3 (GOWN DISPOSABLE) ×1 IMPLANT
GOWN STRL REUS W/TWL LRG LVL3 (GOWN DISPOSABLE) ×3
GOWN STRL REUS W/TWL XL LVL3 (GOWN DISPOSABLE) ×3
HEMOSTAT SNOW SURGICEL 2X4 (HEMOSTASIS) ×2 IMPLANT
KIT BASIN OR (CUSTOM PROCEDURE TRAY) ×3 IMPLANT
KIT MARKER MARGIN INK (KITS) ×3 IMPLANT
LIGHT WAVEGUIDE WIDE FLAT (MISCELLANEOUS) ×3 IMPLANT
NDL FILTER BLUNT 18X1 1/2 (NEEDLE) IMPLANT
NDL HYPO 25X1 1.5 SAFETY (NEEDLE) ×1 IMPLANT
NDL SAFETY ECLIPSE 18X1.5 (NEEDLE) IMPLANT
NEEDLE FILTER BLUNT 18X 1/2SAF (NEEDLE) ×2
NEEDLE FILTER BLUNT 18X1 1/2 (NEEDLE) ×1 IMPLANT
NEEDLE HYPO 18GX1.5 SHARP (NEEDLE) ×3
NEEDLE HYPO 25X1 1.5 SAFETY (NEEDLE) ×6 IMPLANT
NS IRRIG 1000ML POUR BTL (IV SOLUTION) ×3 IMPLANT
PACK SURGICAL SETUP 50X90 (CUSTOM PROCEDURE TRAY) ×3 IMPLANT
PENCIL BUTTON HOLSTER BLD 10FT (ELECTRODE) ×3 IMPLANT
SPONGE LAP 18X18 X RAY DECT (DISPOSABLE) ×3 IMPLANT
SUT MNCRL AB 4-0 PS2 18 (SUTURE) ×5 IMPLANT
SUT VIC AB 3-0 SH 18 (SUTURE) ×3 IMPLANT
SYR BULB 3OZ (MISCELLANEOUS) ×3 IMPLANT
SYR CONTROL 10ML LL (SYRINGE) ×5 IMPLANT
TOWEL OR 17X24 6PK STRL BLUE (TOWEL DISPOSABLE) ×1 IMPLANT
TOWEL OR 17X26 10 PK STRL BLUE (TOWEL DISPOSABLE) ×3 IMPLANT
TUBE CONNECTING 12'X1/4 (SUCTIONS) ×1
TUBE CONNECTING 12X1/4 (SUCTIONS) ×2 IMPLANT
YANKAUER SUCT BULB TIP NO VENT (SUCTIONS) ×3 IMPLANT

## 2015-12-08 NOTE — Anesthesia Procedure Notes (Signed)
Anesthesia Regional Block:  Pectoralis block  Pre-Anesthetic Checklist: ,, timeout performed, Correct Patient, Correct Site, Correct Laterality, Correct Procedure, Correct Position, site marked, Risks and benefits discussed,  Surgical consent,  Pre-op evaluation,  At surgeon's request and post-op pain management  Laterality: Right  Prep: chloraprep       Needles:  Injection technique: Single-shot  Needle Type: Echogenic Needle     Needle Length: 9cm 9 cm Needle Gauge: 21 and 21 G    Additional Needles:  Procedures: ultrasound guided (picture in chart) Pectoralis block Narrative:  Injection made incrementally with aspirations every 5 mL.  Performed by: Personally  Anesthesiologist: Catalina Gravel  Additional Notes: No pain on injection. No increased resistance to injection. Injection made in 5cc increments.  Good needle visualization.  Patient tolerated procedure well.

## 2015-12-08 NOTE — Interval H&P Note (Signed)
History and Physical Interval Note:  12/08/2015 11:04 AM  Marie Wilson  has presented today for surgery, with the diagnosis of RIGHT BREAST CANCER  The various methods of treatment have been discussed with the patient and family. After consideration of risks, benefits and other options for treatment, the patient has consented to  Procedure(s): BREAST LUMPECTOMY WITH RADIOACTIVE SEED AND SENTINEL LYMPH NODE BIOPSY (Right) as a surgical intervention .  The patient's history has been reviewed, patient examined, no change in status, stable for surgery.  I have reviewed the patient's chart and labs.  Questions were answered to the patient's satisfaction.     Jaydin Boniface A.

## 2015-12-08 NOTE — Anesthesia Postprocedure Evaluation (Signed)
Anesthesia Post Note  Patient: Marie Wilson  Procedure(s) Performed: Procedure(s) (LRB): BREAST LUMPECTOMY WITH RADIOACTIVE SEED AND SENTINEL LYMPH NODE BIOPSY; WITH LYMPHATIC MAPPING (Right)  Patient location during evaluation: PACU Anesthesia Type: General and Regional Level of consciousness: awake and alert Pain management: pain level controlled Vital Signs Assessment: post-procedure vital signs reviewed and stable Respiratory status: spontaneous breathing, nonlabored ventilation, respiratory function stable and patient connected to nasal cannula oxygen Cardiovascular status: blood pressure returned to baseline and stable Postop Assessment: no signs of nausea or vomiting Anesthetic complications: no    Last Vitals:  Vitals:   12/08/15 1349 12/08/15 1430  BP: 107/68 125/65  Pulse: 74 70  Resp: 12 17  Temp:  36.4 C    Last Pain:  Vitals:   12/08/15 1350  PainSc: 5                  Catalina Gravel

## 2015-12-08 NOTE — Transfer of Care (Signed)
Immediate Anesthesia Transfer of Care Note  Patient: Marie Wilson  Procedure(s) Performed: Procedure(s): BREAST LUMPECTOMY WITH RADIOACTIVE SEED AND SENTINEL LYMPH NODE BIOPSY (Right)  Patient Location: PACU  Anesthesia Type:GA combined with regional for post-op pain  Level of Consciousness: awake, oriented and patient cooperative  Airway & Oxygen Therapy: Patient Spontanous Breathing and Patient connected to face mask oxygen  Post-op Assessment: Report given to RN and Post -op Vital signs reviewed and stable  Post vital signs: Reviewed and stable  Last Vitals:  Vitals:   12/08/15 1114 12/08/15 1116  BP: (!) 151/71 132/64  Pulse: 68 67  Resp: 15 14  Temp:      Last Pain: There were no vitals filed for this visit.       Complications: No apparent anesthesia complications

## 2015-12-08 NOTE — Op Note (Signed)
Preoperative diagnosis: Stage I right breast cancer  Postoperative diagnosis: Same  Procedure: Right breast seed localized partial mastectomy with right axillary sentinel lymph node mapping with methylene blue dye  Surgeon: Erroll Luna M.D.  Anesthesia: Gen. with regional block and 0.25% Sensorcaine local with epinephrine  EBL: 30 mL  Specimen: Right breast mass with clip and seed plus additional medial margin and 3 hot sentinel nodes  Drains: None  Indications for procedure: The patient's a 50 year old female who presents for breast conserving surgery for stage I breast cancer. She was seen evaluated and opted for breast conservation after reviewing all of her options as well as mastectomy and reconstruction. Risks, benefits and alternative therapies were discussed.The procedure has been discussed with the patient. Alternatives to surgery have been discussed with the patient.  Risks of surgery include bleeding,  Infection,  Seroma formation, death,  and the need for further surgery.   The patient understands and wishes to proceed.Sentinel lymph node mapping and dissection has been discussed with the patient.  Risk of bleeding,  Infection,  Seroma formation,  Additional procedures,,  Shoulder weakness ,  Shoulder stiffness,  Nerve and blood vessel injury and reaction to the mapping dyes have been discussed.  Alternatives to surgery have been discussed with the patient.  The patient agrees to proceed.   Description of procedure: The patient was met in the holding area. A pectoral block was placed anesthesia. Nuclear medicine injected her right breast for mapping. All questions are answered the right side was marked as correct. She was taken back to the operating room. She's placed upon the OR table. After induction of general anesthesia the right breast was prepped and draped in a sterile fashion and timeout was done. She received preoperative antibiotics. 4 mL of methylene blue dye were  injected under the right nipple and massaged. The neoprobe was used and the hotspot with seed and clip were localized was in the upper inner quadrant. A transverse incision was made over this. Dissection was carried down all tissue around the seed and  clip were excised with a grossly negative margin. Radiograph revealed both seed and clip to be in the specimen. Additional medial margin was taken since this appeared close. The cavity was marked with clips. The cavity was closed with 3-0 Vicryl and 4-0 Monocryl.  The neoprobe settings were changed to technetium. The right axilla was evaluated. A transverse incision was made in the inferior hairline of the right axilla. Neoprobe was used and a blue hot node was identified in the Level One axillary basin. 2 other sentinel nodes which were hot were identified. Background counts approached 0. The balloon was irrigated and found to be hemostatic. Surgicel snow was placed. Williams closed with 3-0 Vicryl and 4-0 Monocryl. Liquid adhesive applied to both incisions. Breast binder placed. All final counts sponge, needles and enhancement found to be correct. The patient was awoke extubated taken to recovery in satisfactory condition.

## 2015-12-08 NOTE — Anesthesia Procedure Notes (Signed)
Procedure Name: Intubation Date/Time: 12/08/2015 11:39 AM Performed by: Melina Copa, Knowledge Escandon R Pre-anesthesia Checklist: Patient identified, Emergency Drugs available, Suction available and Patient being monitored Patient Re-evaluated:Patient Re-evaluated prior to inductionOxygen Delivery Method: Circle System Utilized Preoxygenation: Pre-oxygenation with 100% oxygen Intubation Type: IV induction Ventilation: Mask ventilation without difficulty Laryngoscope Size: Mac and 3 Grade View: Grade II Tube type: Oral Tube size: 7.5 mm Number of attempts: 1 Airway Equipment and Method: Stylet and Oral airway Placement Confirmation: ETT inserted through vocal cords under direct vision,  positive ETCO2 and breath sounds checked- equal and bilateral Secured at: 22 cm Tube secured with: Tape Dental Injury: Teeth and Oropharynx as per pre-operative assessment

## 2015-12-08 NOTE — H&P (View-Only) (Signed)
Rudi Heap. Ostrum 11/21/2015 2:14 PM Location: [facility] Patient #: [id number] DOB: [birth year] Single / Language: Cleophus Molt / Race: White Female  History of Present Illness Marcello Moores A. Amyah Clawson MD; 11/21/2015 4:45 PM) Patient words: Patient returns for follow-up of her right breast cancer. She has not received her genetic screening but 1 to discuss her options today again. We spent 30 minutes discussing lumpectomy and sentinel lymph node mapping versus mastectomy and reconstruction. The pros and cons of each were discussed today. She would like to proceed with scheduling her right breast lumpectomy and sentinel lymph node mapping SINCE she is leaning in that direction.   Pt sent at the request of Dr Sondra Come for right breast cancer detected on screening mammogram. Pt has a family history of breast cancer. Pt denoes breast pain, mass or nipple discharge.   Mammogram shows a 4 mm mass medial right breast core bx proven DCIS/IDC ER+ PR + HER 2 Neu -  Pt desires genetic screening. .  The patient is a 50 year old female.   Allergies (April Staton, CMA; 11/21/2015 2:15 PM) No Known Drug Allergies 11/21/2015  Medication History (April Staton, CMA; 11/21/2015 2:15 PM) ALPRAZolam (0.5MG  Tablet, Oral) Active. Ferrous Sulfate (325 (65 Fe)MG Tablet, Oral) Active. Ibuprofen (200MG  Tablet, Oral) Active. Synthroid (88MCG Tablet, Oral) Active. Multivitamins/Minerals (Oral) Active. Percocet (7.5-325MG  Tablet, Oral) Active. Propranolol HCl ER (60MG  Capsule ER 24HR, Oral) Active. Zoloft (100MG  Tablet, Oral) Active. Medications Reconciled    Vitals (April Staton CMA; 11/21/2015 2:16 PM) 11/21/2015 2:15 PM Weight: 331 lb Height: 66in Height was reported by patient. Body Surface Area: 2.48 m Body Mass Index: 53.42 kg/m  Temp.: 97.34F(Oral)  Pulse: 63 (Regular)  P.OX: 95% (Room air) BP: 108/70 (Sitting, Left Arm, Standard)      Physical Exam (Carsynn Bethune A.  Nikita Humble MD; 11/21/2015 4:45 PM)  General Mental Status-Alert. General Appearance-Consistent with stated age. Hydration-Well hydrated. Voice-Normal.  Neurologic Neurologic evaluation reveals -alert and oriented x 3 with no impairment of recent or remote memory. Mental Status-Normal.    Assessment & Plan (Serayah Yazdani A. Keena Heesch MD; 11/21/2015 4:45 PM)  BREAST CANCER, RIGHT (C50.911) Impression: Risk of lumpectomy include bleeding, infection, seroma, more surgery, use of seed/wire, wound care, cosmetic deformity and the need for other treatments, death , blood clots, death. Pt agrees to proceed. Risk of sentinel lymph node mapping include bleeding, infection, lymphedema, shoulder pain. stiffness, dye allergy. cosmetic deformity , blood clots, death, need for more surgery. Pt agres to proceed. pt desires lumpectomy and SLN mapping right  Current Plans You are being scheduled for surgery - Our schedulers will call you.  You should hear from our office's scheduling department within 5 working days about the location, date, and time of surgery. We try to make accommodations for patient's preferences in scheduling surgery, but sometimes the OR schedule or the surgeon's schedule prevents [place] from making those accommodations.  If you have not heard from our office [phone]) in 5 working days, call the office and ask for your surgeon's nurse.  If you have other questions about your diagnosis, plan, or surgery, call the office and ask for your surgeon's nurse.  We discussed the staging and pathophysiology of breast cancer. We discussed all of the different options for treatment for breast cancer including surgery, chemotherapy, radiation therapy, Herceptin, and antiestrogen therapy. We discussed a sentinel lymph node biopsy as she does not appear to having lymph node involvement right now. We discussed the performance of that with injection of radioactive tracer  and blue dye. We discussed  that she would have an incision underneath her axillary hairline. We discussed that there is a bout a 10-20% chance of having a positive node with a sentinel lymph node biopsy and we will await the permanent pathology to make any other first further decisions in terms of her treatment. One of these options might be to return to the operating room to perform an axillary lymph node dissection. We discussed about a 1-2% risk lifetime of chronic shoulder pain as well as lymphedema associated with a sentinel lymph node biopsy. We discussed the options for treatment of the breast cancer which included lumpectomy versus a mastectomy. We discussed the performance of the lumpectomy with a wire placement. We discussed a 10-20% chance of a positive margin requiring reexcision in the operating room. We also discussed that she may need radiation therapy or antiestrogen therapy or both if she undergoes lumpectomy. We discussed the mastectomy and the postoperative care for that as well. We discussed that there is no difference in her survival whether she undergoes lumpectomy with radiation therapy or antiestrogen therapy versus a mastectomy. There is a slight difference in the local recurrence rate being 3-5% with lumpectomy and about 1% with a mastectomy. We discussed the risks of operation including bleeding, infection, possible reoperation. She understands her further therapy will be based on what her stages at the time of her operation.  Pt Education - [facility] breast cancer surgery: discussed with patient and provided information. Pt Education - [facility] Breast Biopsy HCI: discussed with patient and provided information.

## 2015-12-08 NOTE — Discharge Instructions (Signed)
[facility] °Office Phone Number [phone] ° °BREAST BIOPSY/ PARTIAL MASTECTOMY: POST OP INSTRUCTIONS ° °Always review your discharge instruction sheet given to you by the facility where your surgery was performed. ° °IF YOU HAVE DISABILITY OR FAMILY LEAVE FORMS, YOU MUST BRING THEM TO THE OFFICE FOR PROCESSING.  DO NOT GIVE THEM TO YOUR DOCTOR. ° °1. A prescription for pain medication may be given to you upon discharge.  Take your pain medication as prescribed, if needed.  If narcotic pain medicine is not needed, then you may take acetaminophen (Tylenol) or ibuprofen (Advil) as needed. °2. Take your usually prescribed medications unless otherwise directed °3. If you need a refill on your pain medication, please contact your pharmacy.  They will contact our office to request authorization.  Prescriptions will not be filled after 5pm or on week-ends. °4. You should eat very light the first 24 hours after surgery, such as soup, crackers, pudding, etc.  Resume your normal diet the day after surgery. °5. Most patients will experience some swelling and bruising in the breast.  Ice packs and a good support bra will help.  Swelling and bruising can take several days to resolve.  °6. It is common to experience some constipation if taking pain medication after surgery.  Increasing fluid intake and taking a stool softener will usually help or prevent this problem from occurring.  A mild laxative (Milk of Magnesia or Miralax) should be taken according to package directions if there are no bowel movements after 48 hours. °7. Unless discharge instructions indicate otherwise, you may remove your bandages 24-48 hours after surgery, and you may shower at that time.  You may have steri-strips (small skin tapes) in place directly over the incision.  These strips should be left on the skin for 7-10 days.  If your surgeon used skin glue on the incision, you may shower in 24 hours.  The glue will flake off over the  next 2-3 weeks.  Any sutures or staples will be removed at the office during your follow-up visit. °8. ACTIVITIES:  You may resume regular daily activities (gradually increasing) beginning the next day.  Wearing a good support bra or sports bra minimizes pain and swelling.  You may have sexual intercourse when it is comfortable. °a. You may drive when you no longer are taking prescription pain medication, you can comfortably wear a seatbelt, and you can safely maneuver your car and apply brakes. °b. RETURN TO WORK:  ______________________________________________________________________________________ °9. You should see your doctor in the office for a follow-up appointment approximately two weeks after your surgery.  Your doctor’s nurse will typically make your follow-up appointment when she calls you with your pathology report.  Expect your pathology report 2-3 business days after your surgery.  You may call to check if you do not hear from us after three days. °10. OTHER INSTRUCTIONS: _______________________________________________________________________________________________ _____________________________________________________________________________________________________________________________________ °_____________________________________________________________________________________________________________________________________ °_____________________________________________________________________________________________________________________________________ ° °WHEN TO CALL YOUR DOCTOR: °1. Fever over 101.0 °2. Nausea and/or vomiting. °3. Extreme swelling or bruising. °4. Continued bleeding from incision. °5. Increased pain, redness, or drainage from the incision. ° °The clinic staff is available to answer your questions during regular business hours.  Please don’t hesitate to call and ask to speak to one of the nurses for clinical concerns.  If you have a medical emergency, go to the nearest  emergency room or call 911.  A surgeon from [facility] is always on call at the hospital. ° °For further questions, please visit [email or web address]  °

## 2015-12-08 NOTE — Anesthesia Preprocedure Evaluation (Signed)
Anesthesia Evaluation  Patient identified by MRN, date of birth, ID band Patient awake    Reviewed: Allergy & Precautions, NPO status , Patient's Chart, lab work & pertinent test results  Airway Mallampati: II  TM Distance: >3 FB Neck ROM: Full    Dental  (+) Teeth Intact, Dental Advisory Given, Implants,    Pulmonary neg pulmonary ROS,    Pulmonary exam normal breath sounds clear to auscultation       Cardiovascular Exercise Tolerance: Good negative cardio ROS Normal cardiovascular exam Rhythm:Regular Rate:Normal     Neuro/Psych PSYCHIATRIC DISORDERS Anxiety negative neurological ROS     GI/Hepatic negative GI ROS, Neg liver ROS,   Endo/Other  diabetes, Well Controlled, Type 2Hypothyroidism Morbid obesity  Renal/GU negative Renal ROS     Musculoskeletal negative musculoskeletal ROS (+)   Abdominal   Peds  Hematology negative hematology ROS (+)   Anesthesia Other Findings Day of surgery medications reviewed with the patient.  Reproductive/Obstetrics negative OB ROS                             Anesthesia Physical Anesthesia Plan  ASA: III  Anesthesia Plan: General and Regional   Post-op Pain Management:  Regional for Post-op pain   Induction: Intravenous  Airway Management Planned: Oral ETT  Additional Equipment:   Intra-op Plan:   Post-operative Plan: Extubation in OR  Informed Consent: I have reviewed the patients History and Physical, chart, labs and discussed the procedure including the risks, benefits and alternatives for the proposed anesthesia with the patient or authorized representative who has indicated his/her understanding and acceptance.   Dental advisory given  Plan Discussed with: CRNA  Anesthesia Plan Comments: (Risks/benefits of general anesthesia discussed with patient including risk of damage to teeth, lips, gum, and tongue, nausea/vomiting, allergic  reactions to medications, and the possibility of heart attack, stroke and death.  All patient questions answered.  Patient wishes to proceed.)        Anesthesia Quick Evaluation

## 2015-12-09 ENCOUNTER — Encounter (HOSPITAL_COMMUNITY): Payer: Self-pay | Admitting: Surgery

## 2015-12-09 LAB — HEMOGLOBIN A1C
Hgb A1c MFr Bld: 5.8 % — ABNORMAL HIGH (ref 4.8–5.6)
MEAN PLASMA GLUCOSE: 120 mg/dL

## 2015-12-14 ENCOUNTER — Encounter: Payer: Self-pay | Admitting: Hematology and Oncology

## 2015-12-14 ENCOUNTER — Ambulatory Visit (HOSPITAL_BASED_OUTPATIENT_CLINIC_OR_DEPARTMENT_OTHER): Payer: 59 | Admitting: Hematology and Oncology

## 2015-12-14 DIAGNOSIS — Z170 Estrogen receptor positive status [ER+]: Secondary | ICD-10-CM

## 2015-12-14 DIAGNOSIS — C50211 Malignant neoplasm of upper-inner quadrant of right female breast: Secondary | ICD-10-CM | POA: Diagnosis not present

## 2015-12-14 NOTE — Progress Notes (Signed)
Patient Care Team: Thressa Sheller, MD as PCP - General (Internal Medicine) Erroll Luna, MD as Consulting Physician (General Surgery) Nicholas Lose, MD as Consulting Physician (Hematology and Oncology) Gery Pray, MD as Consulting Physician (Radiation Oncology)  DIAGNOSIS:  Encounter Diagnosis  Name Primary?  . Malignant neoplasm of upper-inner quadrant of right breast in female, estrogen receptor positive ([place])     SUMMARY OF ONCOLOGIC HISTORY:   Breast cancer of upper-inner quadrant of right female breast ([place])   10/27/2015 Initial Diagnosis    Screening detected right breast calcifications spanning 4 mm, biopsy intermediate grade DCIS with small focus of invasive ductal carcinoma ER 95%, PR 95%, HER-2 negative ratio 1.16, Ki-67 2%, T1a N0 stage IA      12/08/2015 Surgery    Right lumpectomy: IDC grade 1, 0.6 cm, DCIS with calcifications, intermediate grade, margins negative, 0/7 lymph nodes negative, ER 95%, PR 95%, HER-2 negative ratio 1.16, Ki-67 2%, T1 BN 0 stage IA       CHIEF COMPLIANT: Follow-up after recent lumpectomy  INTERVAL HISTORY: Marie Wilson is a 50 year old with above-mentioned history of right breast cancer underwent lumpectomy and is here today to discuss the pathology report. She is extremely sore underneath the axilla but otherwise she is doing well. She feels that there is swelling underneath her arm. She denies any fevers or chills.  REVIEW OF SYSTEMS:   Constitutional: Denies fevers, chills or abnormal weight loss Eyes: Denies blurriness of vision Ears, nose, mouth, throat, and face: Denies mucositis or sore throat Respiratory: Denies cough, dyspnea or wheezes Cardiovascular: Denies palpitation, chest discomfort Gastrointestinal:  Denies nausea, heartburn or change in bowel habits Skin: Denies abnormal skin rashes Lymphatics: Denies new lymphadenopathy or easy bruising Neurological:Denies numbness, tingling or new weaknesses Behavioral/Psych:  Mood is stable, no new changes  Extremities: No lower extremity edema Breast:  Recent lumpectomy All other systems were reviewed with the patient and are negative.  I have reviewed the past medical history, past surgical history, social history and family history with the patient and they are unchanged from previous note.  ALLERGIES:  is allergic to no known allergies.  MEDICATIONS:  Current Outpatient Prescriptions  Medication Sig Dispense Refill  . ALPRAZolam (XANAX) 0.5 MG tablet Take 0.5 mg by mouth daily as needed for anxiety.    Marland Kitchen ibuprofen (ADVIL,MOTRIN) 200 MG tablet Take 400-800 mg by mouth every 6 (six) hours as needed for cramping.    Marland Kitchen levothyroxine (SYNTHROID, LEVOTHROID) 88 MCG tablet Take 88 mcg by mouth daily before breakfast.    . loratadine (CLARITIN) 10 MG tablet Take 10 mg by mouth daily.    . Melatonin 3 MG TABS Take 2 tablets by mouth at bedtime as needed (sleep).     . Multiple Vitamins-Minerals (MULTIVITAMIN PO) Take 1 tablet by mouth daily.    Marland Kitchen oxyCODONE-acetaminophen (ROXICET) 5-325 MG tablet Take 1 tablet by mouth every 4 (four) hours as needed. 25 tablet 0  . propranolol ER (INDERAL LA) 60 MG 24 hr capsule Take 60 mg by mouth daily.    . tamoxifen (NOLVADEX) 20 MG tablet Take 1 tablet (20 mg total) by mouth daily. (Patient taking differently: Take 20 mg by mouth daily. Will start after surgery) 90 tablet 3  . venlafaxine (EFFEXOR) 75 MG tablet Take 75 mg by mouth at bedtime.     No current facility-administered medications for this visit.     PHYSICAL EXAMINATION: ECOG PERFORMANCE STATUS: 1 - Symptomatic but completely ambulatory  Vitals:  12/14/15 1412  BP: 128/78  Pulse: 79  Resp: 18  Temp: 98.1 F (36.7 C)   Filed Weights   12/14/15 1412  Weight: (!) 331 lb 11.2 oz (150.5 kg)    GENERAL:alert, no distress and comfortable SKIN: skin color, texture, turgor are normal, no rashes or significant lesions EYES: normal, Conjunctiva are pink and  non-injected, sclera clear OROPHARYNX:no exudate, no erythema and lips, buccal mucosa, and tongue normal  NECK: supple, thyroid normal size, non-tender, without nodularity LYMPH:  no palpable lymphadenopathy in the cervical, axillary or inguinal LUNGS: clear to auscultation and percussion with normal breathing effort HEART: regular rate & rhythm and no murmurs and no lower extremity edema ABDOMEN:abdomen soft, non-tender and normal bowel sounds MUSCULOSKELETAL:no cyanosis of digits and no clubbing  NEURO: alert & oriented x 3 with fluent speech, no focal motor/sensory deficits EXTREMITIES: No lower extremity edema  LABORATORY DATA:  I have reviewed the data as listed   Chemistry      Component Value Date/Time   NA 135 12/08/2015 0912   NA 139 11/02/2015 0840   K 4.0 12/08/2015 0912   K 4.0 11/02/2015 0840   CL 102 12/08/2015 0912   CO2 25 12/08/2015 0912   CO2 23 11/02/2015 0840   BUN 9 12/08/2015 0912   BUN 11.6 11/02/2015 0840   CREATININE 0.81 12/08/2015 0912   CREATININE 0.8 11/02/2015 0840      Component Value Date/Time   CALCIUM 9.1 12/08/2015 0912   CALCIUM 9.1 11/02/2015 0840   ALKPHOS 90 11/02/2015 0840   AST 21 11/02/2015 0840   ALT 26 11/02/2015 0840   BILITOT 0.44 11/02/2015 0840       Lab Results  Component Value Date   WBC 8.1 12/08/2015   HGB 13.9 12/08/2015   HCT 42.7 12/08/2015   MCV 86.8 12/08/2015   PLT 277 12/08/2015   NEUTROABS 4.6 11/02/2015     ASSESSMENT & PLAN:  Breast cancer of upper-inner quadrant of right female breast ([place]) Right lumpectomy 12/08/2015: IDC grade 1, 0.6 cm, DCIS with calcifications, intermediate grade, margins negative, 0/7 lymph nodes negative, ER 95%, PR 95%, HER-2 negative ratio 1.16, Ki-67 2%, T1 BN 0 stage IA  Pathology counseling: I discussed the final pathology report of the patient provided  a copy of this report. I discussed the margins as well as lymph node surgeries. We also discussed the final staging along  with previously performed ER/PR and HER-2/neu testing.  Treatment plan:  1. Adjuvant radiation 2. Followed by antiestrogen therapy with tamoxifen 20 mg daily.  Tamoxifen counseling: We discussed the risks and benefits of tamoxifen. These include but not limited to insomnia, hot flashes, mood changes, vaginal dryness, and weight gain. Although rare, serious side effects including risk of blood clots were also discussed. We strongly believe that the benefits far outweigh the risks. Patient understands these risks and consented to starting treatment. Planned treatment duration is 5 years.  Return to clinic mid-February to assess tolerability to tamoxifen therapy.    No orders of the defined types were placed in this encounter.  The patient has a good understanding of the overall plan. she agrees with it. she will call with any problems that may develop before the next visit here.   Rulon Eisenmenger, MD 12/14/15

## 2015-12-14 NOTE — Assessment & Plan Note (Signed)
Right lumpectomy 12/08/2015: IDC grade 1, 0.6 cm, DCIS with calcifications, intermediate grade, margins negative, 0/7 lymph nodes negative, ER 95%, PR 95%, HER-2 negative ratio 1.16, Ki-67 2%, T1 BN 0 stage IA  Pathology counseling: I discussed the final pathology report of the patient provided  a copy of this report. I discussed the margins as well as lymph node surgeries. We also discussed the final staging along with previously performed ER/PR and HER-2/neu testing.  Treatment plan:  1. Adjuvant radiation 2. Followed by antiestrogen therapy with tamoxifen 20 mg daily.  Tamoxifen counseling: We discussed the risks and benefits of tamoxifen. These include but not limited to insomnia, hot flashes, mood changes, vaginal dryness, and weight gain. Although rare, serious side effects including risk of blood clots were also discussed. We strongly believe that the benefits far outweigh the risks. Patient understands these risks and consented to starting treatment. Planned treatment duration is 5 years.  Return to clinic mid-February to assess tolerability to tamoxifen therapy.

## 2015-12-15 DIAGNOSIS — M71572 Other bursitis, not elsewhere classified, left ankle and foot: Secondary | ICD-10-CM | POA: Diagnosis not present

## 2015-12-15 DIAGNOSIS — M76822 Posterior tibial tendinitis, left leg: Secondary | ICD-10-CM | POA: Diagnosis not present

## 2015-12-15 DIAGNOSIS — M76821 Posterior tibial tendinitis, right leg: Secondary | ICD-10-CM | POA: Diagnosis not present

## 2015-12-15 DIAGNOSIS — M7732 Calcaneal spur, left foot: Secondary | ICD-10-CM | POA: Diagnosis not present

## 2015-12-15 DIAGNOSIS — M7731 Calcaneal spur, right foot: Secondary | ICD-10-CM | POA: Diagnosis not present

## 2015-12-15 DIAGNOSIS — M722 Plantar fascial fibromatosis: Secondary | ICD-10-CM | POA: Diagnosis not present

## 2015-12-15 DIAGNOSIS — M71571 Other bursitis, not elsewhere classified, right ankle and foot: Secondary | ICD-10-CM | POA: Diagnosis not present

## 2015-12-23 NOTE — Progress Notes (Signed)
Location of Breast Cancer: upper-inner quadrant of right breast  Histology per Pathology Report:   12/08/15 Diagnosis 1. Breast, lumpectomy, Right - INVASIVE DUCTAL CARCINOMA, GRADE I/III, SPANNING 0.6 CM. - DUCTAL CARCINOMA IN SITU WITH CALCIFICATIONS, INTERMEDIATE GRADE. - THE SURGICAL RESECTION MARGINS ARE NEGATIVE FOR CARCINOMA. - SEE ONCOLOGY TABLE BELOW. 2. Lymph node, sentinel, biopsy, Right - THERE IS NO EVIDENCE OF CARCINOMA IN 1 OF 1 LYMPH NODE (0/1). 3. Lymph node, sentinel, biopsy, Right - THERE IS NO EVIDENCE OF CARCINOMA IN 1 OF 1 LYMPH NODE (0/1). 4. Lymph node, sentinel, biopsy, Right - THERE IS NO EVIDENCE OF CARCINOMA IN 1 OF 1 LYMPH NODE (0/1). 5. Lymph node, sentinel, biopsy, Right - THERE IS NO EVIDENCE OF CARCINOMA IN 1 OF 1 LYMPH NODE (0/1). 6. Lymph node, sentinel, biopsy, Right - THERE IS NO EVIDENCE OF CARCINOMA IN 1 OF 1 LYMPH NODE (0/1). 7. Lymph node, sentinel, biopsy, Right - THERE IS NO EVIDENCE OF CARCINOMA IN 1 OF 1 LYMPH NODE (0/1). 8. Lymph node, sentinel, biopsy, Right - THERE IS NO EVIDENCE OF CARCINOMA IN 1 OF 1 LYMPH NODE (0/1). 9. Breast, excision, Right additional Medial Margin - FIBROCYSTIC CHANGES. - THERE IS NO EVIDENCE OF MALIGNANCY. - SEE COMMENT.  10/27/15 Diagnosis Breast, right, needle core biopsy, medial - INVASIVE DUCTAL CARCINOMA. - DUCTAL CARCINOMA IN SITU WITH CALCIFICATIONS. - SEE COMMENT.  Receptor Status: ER(95%), PR (95%), Her2-neu (neg), Ki-(2%)  Did patient present with symptoms (if so, please note symptoms) or was this found on screening mammography?: screening mammogram  Past/Anticipated interventions by surgeon, if any: 12/08/15 - Procedure: BREAST LUMPECTOMY WITH RADIOACTIVE SEED AND SENTINEL LYMPH NODE BIOPSY; WITH LYMPHATIC MAPPING;  Surgeon: Erroll Luna, MD  Past/Anticipated interventions by medical oncology, if any: antiestrogen therapy with tamoxifen 20 mg daily to start after radiation.  Lymphedema  issues, if any:      Pain issues, if any:     OB Gyn history:Age at first menstrual period? 12. Are you still having periods? No.  If you no longer have periods: Have you used hormone replacement? No. How many children have you carried to term? 1 Have you used birth control pills or hormone shots for contraception? Yes If so, for how long (or approximate dates)? 10 yrs/ twenties   SAFETY ISSUES:  Prior radiation?   Pacemaker/ICD?   Possible current pregnancy?no  Is the patient on methotrexate?   Current Complaints / other details:      Jacqulyn Liner, RN 12/23/2015,12:44 PM

## 2015-12-28 ENCOUNTER — Ambulatory Visit
Admission: RE | Admit: 2015-12-28 | Discharge: 2015-12-28 | Disposition: A | Discharge status: Home / Self Care | Payer: 59 | Source: Ambulatory Visit | Attending: Radiation Oncology | Admitting: Radiation Oncology

## 2015-12-28 ENCOUNTER — Ambulatory Visit: Payer: 59

## 2015-12-28 DIAGNOSIS — Z170 Estrogen receptor positive status [ER+]: Secondary | ICD-10-CM | POA: Insufficient documentation

## 2015-12-28 DIAGNOSIS — C50211 Malignant neoplasm of upper-inner quadrant of right female breast: Secondary | ICD-10-CM | POA: Insufficient documentation

## 2015-12-28 DIAGNOSIS — Z510 Encounter for antineoplastic radiation therapy: Secondary | ICD-10-CM | POA: Insufficient documentation

## 2015-12-28 DIAGNOSIS — Z79899 Other long term (current) drug therapy: Secondary | ICD-10-CM | POA: Insufficient documentation

## 2015-12-30 NOTE — Progress Notes (Signed)
Location of Breast Cancer: upper-inner quadrant of right breast    Histology per Pathology Report:   12/08/15 Diagnosis 1. Breast, lumpectomy, Right - INVASIVE DUCTAL CARCINOMA, GRADE I/III, SPANNING 0.6 CM. - DUCTAL CARCINOMA IN SITU WITH CALCIFICATIONS, INTERMEDIATE GRADE. - THE SURGICAL RESECTION MARGINS ARE NEGATIVE FOR CARCINOMA. - SEE ONCOLOGY TABLE BELOW. 2. Lymph node, sentinel, biopsy, Right - THERE IS NO EVIDENCE OF CARCINOMA IN 1 OF 1 LYMPH NODE (0/1). 3. Lymph node, sentinel, biopsy, Right - THERE IS NO EVIDENCE OF CARCINOMA IN 1 OF 1 LYMPH NODE (0/1). 4. Lymph node, sentinel, biopsy, Right - THERE IS NO EVIDENCE OF CARCINOMA IN 1 OF 1 LYMPH NODE (0/1). 5. Lymph node, sentinel, biopsy, Right - THERE IS NO EVIDENCE OF CARCINOMA IN 1 OF 1 LYMPH NODE (0/1). 6. Lymph node, sentinel, biopsy, Right - THERE IS NO EVIDENCE OF CARCINOMA IN 1 OF 1 LYMPH NODE (0/1). 7. Lymph node, sentinel, biopsy, Right - THERE IS NO EVIDENCE OF CARCINOMA IN 1 OF 1 LYMPH NODE (0/1). 8. Lymph node, sentinel, biopsy, Right - THERE IS NO EVIDENCE OF CARCINOMA IN 1 OF 1 LYMPH NODE (0/1). 9. Breast, excision, Right additional Medial Margin - FIBROCYSTIC CHANGES. - THERE IS NO EVIDENCE OF MALIGNANCY. - SEE COMMENT  10/27/15 Diagnosis Breast, right, needle core biopsy, medial - INVASIVE DUCTAL CARCINOMA. - DUCTAL CARCINOMA IN SITU WITH CALCIFICATIONS. - SEE COMMENT.  Receptor Status: ER(95%), PR (95%), Her2-neu (neg), Ki-(2%)  Did patient present with symptoms (if so, please note symptoms) or was this found on screening mammography?: screening mammogram  Past/Anticipated interventions by surgeon, if any: 12/08/15 -Procedure: BREAST LUMPECTOMY WITH RADIOACTIVE SEED AND SENTINEL LYMPH NODE BIOPSY; WITH LYMPHATIC MAPPING;  Surgeon: Erroll Luna, MD     Past/Anticipated interventions by medical oncology, if any: Per Dr. Lindi Adie - the plan is for "breast conserving surgery followed by adjuvant  radiation followed by antiestrogen therapy with tamoxifen 20 mg daily."  Lymphedema issues, if any:  no   Pain issues, if any:  no but does reports having some soreness under her right arm.  OB Gyn:  Patient was 12 years with her first period.  She has 1 child and was 30 with the birth of her first child.  She used birth control pills for 12 years and IUD for 6 years.  SAFETY ISSUES:  Prior radiation? no  Pacemaker/ICD? no  Possible current pregnancy?no  Is the patient on methotrexate? no  Current Complaints / other details:  She reports the incision under her right arm has not fully healed.  She said it is draining yellow fluid.  BP 125/83 (BP Location: Left Arm, Patient Position: Sitting)   Pulse 71   Temp 98.3 F (36.8 C) (Oral)   Ht 5' 7" (1.702 m)   Wt (!) 328 lb 3.2 oz (148.9 kg)   LMP  (LMP Unknown)   SpO2 99%   BMI 51.40 kg/m    Wt Readings from Last 3 Encounters:  01/04/16 (!) 328 lb 3.2 oz (148.9 kg)  12/14/15 (!) 331 lb 11.2 oz (150.5 kg)  12/08/15 (!) 330 lb (149.7 kg)      Jacqulyn Liner, RN 12/30/2015,8:06 AM

## 2016-01-04 ENCOUNTER — Ambulatory Visit
Admission: RE | Admit: 2016-01-04 | Discharge: 2016-01-04 | Disposition: A | Discharge status: Home / Self Care | Payer: 59 | Source: Ambulatory Visit | Attending: Radiation Oncology | Admitting: Radiation Oncology

## 2016-01-04 VITALS — BP 125/83 | HR 71 | Temp 98.3°F | Ht 67.0 in | Wt 328.2 lb

## 2016-01-04 DIAGNOSIS — Z510 Encounter for antineoplastic radiation therapy: Secondary | ICD-10-CM | POA: Diagnosis not present

## 2016-01-04 DIAGNOSIS — Z9889 Other specified postprocedural states: Secondary | ICD-10-CM | POA: Diagnosis not present

## 2016-01-04 DIAGNOSIS — C50211 Malignant neoplasm of upper-inner quadrant of right female breast: Secondary | ICD-10-CM | POA: Diagnosis not present

## 2016-01-04 DIAGNOSIS — Z170 Estrogen receptor positive status [ER+]: Principal | ICD-10-CM

## 2016-01-04 DIAGNOSIS — Z79899 Other long term (current) drug therapy: Secondary | ICD-10-CM | POA: Diagnosis not present

## 2016-01-04 NOTE — Progress Notes (Signed)
Please see the Nurse Progress Note in the MD Initial Consult Encounter for this patient. 

## 2016-01-04 NOTE — Progress Notes (Signed)
Radiation Oncology         [phone] ________________________________  Name: Marie Wilson MRN: [id number]  Date: 01/04/2016  DOB: 1965-05-07  Re-evaluation Visit Note  CC: Thressa Sheller, MD  Nicholas Lose, MD    ICD-9-CM ICD-10-CM   1. Malignant neoplasm of upper-inner quadrant of right breast in female, estrogen receptor positive ([place]) 174.2 C50.211 Ambulatory referral to Social Work   V86.0 Z17.0     Diagnosis:  Stage IA (T1b, pN0), grade 1 invasive ductal carcinoma of the right breast (ER/PR +, HER2 -)   Narrative:  The patient returns today for re-evaluation. She presented to breast clinic on 11/02/15.  Since clinic, patient underwent right breast lumpectomy with sentinel lymph node biopsy, with lymphatic mapping on 12/08/15 with Dr. Brantley Stage. Per Dr. Geralyn Flash note, patient will have antiestrogen therapy with Tamoxifen 20 mg daily following radiotherapy.  Patient reports the incision under her right arm has not fully healed and it is draining yellow fluid. She notes the fluid soaks through a bandage every day. She denies pain but notes soreness under her right arm. She denies lymphedema issues at this time.                      ALLERGIES:  is allergic to no known allergies.  Meds: Current Outpatient Prescriptions  Medication Sig Dispense Refill  . ALPRAZolam (XANAX) 0.5 MG tablet Take 0.5 mg by mouth daily as needed for anxiety.    Marland Kitchen ibuprofen (ADVIL,MOTRIN) 200 MG tablet Take 400-800 mg by mouth every 6 (six) hours as needed for cramping.    Marland Kitchen levothyroxine (SYNTHROID, LEVOTHROID) 88 MCG tablet Take 88 mcg by mouth daily before breakfast.    . loratadine (CLARITIN) 10 MG tablet Take 10 mg by mouth daily.    . Melatonin 3 MG TABS Take 2 tablets by mouth at bedtime as needed (sleep).     . Multiple Vitamins-Minerals (MULTIVITAMIN PO) Take 1 tablet by mouth daily.    . propranolol ER (INDERAL LA) 60 MG 24 hr capsule Take 60 mg by mouth daily.    Marland Kitchen venlafaxine (EFFEXOR) 75  MG tablet Take 75 mg by mouth at bedtime.    Marland Kitchen oxyCODONE-acetaminophen (ROXICET) 5-325 MG tablet Take 1 tablet by mouth every 4 (four) hours as needed. (Patient not taking: Reported on 01/04/2016) 25 tablet 0  . tamoxifen (NOLVADEX) 20 MG tablet Take 1 tablet (20 mg total) by mouth daily. (Patient not taking: Reported on 01/04/2016) 90 tablet 3   No current facility-administered medications for this encounter.     Physical Findings: The patient is in no acute distress. Patient is alert and oriented.  height is '5\' 7"'  (1.702 m) and weight is 328 lb 3.2 oz (148.9 kg) (abnormal). Her oral temperature is 98.3 F (36.8 C). Her blood pressure is 125/83 and her pulse is 71. Her oxygen saturation is 99%. .  No significant changes. Lungs are clear to auscultation bilaterally. Heart has regular rate and rhythm. No palpable cervical, supraclavicular, or axillary adenopathy. Abdomen soft, non-tender, normal bowel sounds. Lumpectomy scar of UIQ of right breast is healing well without signs of drainage or infection. She has a right axillary scar from her sentinel lymph node procedure. Shows some slight separation but no signs of infection.   Lab Findings: Lab Results  Component Value Date   WBC 8.1 12/08/2015   HGB 13.9 12/08/2015   HCT 42.7 12/08/2015   MCV 86.8 12/08/2015   PLT 277 12/08/2015  Radiographic Findings: Nm Sentinel Node Inj-no Rpt (breast)  Result Date: 12/08/2015 CLINICAL DATA: right breast cancer Sulfur colloid was injected intradermally by the nuclear medicine technologist for breast cancer sentinel node localization.   Mm Breast Surgical Specimen  Result Date: 12/08/2015 CLINICAL DATA:  Post right breast lumpectomy for grade 1 IDC with DCIS. EXAM: SPECIMEN RADIOGRAPH OF THE RIGHT BREAST COMPARISON:  Previous exam(s). FINDINGS: Status post excision of the right breast. The radioactive seed and biopsy marker clip are present, completely intact, and were marked for pathology.  IMPRESSION: Specimen radiograph of the right breast. Electronically Signed   By: Fidela Salisbury M.D.   On: 12/08/2015 12:17   Mm Rt Radioactive Seed Loc Mammo Guide  Result Date: 12/07/2015 CLINICAL DATA:  Biopsy proven grade 1 invasive ductal carcinoma and ductal carcinoma in-situ in the right breast. EXAM: MAMMOGRAPHIC GUIDED RADIOACTIVE SEED LOCALIZATION OF THE RIGHT BREAST COMPARISON:  Previous exam(s). FINDINGS: Patient presents for radioactive seed localization prior to surgery. I met with the patient and we discussed the procedure of seed localization including benefits and alternatives. We discussed the high likelihood of a successful procedure. We discussed the risks of the procedure including infection, bleeding, tissue injury and further surgery. We discussed the low dose of radioactivity involved in the procedure. Informed, written consent was given. The usual time-out protocol was performed immediately prior to the procedure. Using mammographic guidance, sterile technique, 1% lidocaine and an I-125 radioactive seed, the X shaped clip was localized using a superior to inferior approach. The follow-up mammogram images confirm the seed in the expected location and were marked for Dr. Alena Bills. Follow-up survey of the patient confirms presence of the radioactive seed. Order number of I-125 seed:  [id number]. Total activity:  6.82 millicuries  Reference Date: 11/09/2015 The patient tolerated the procedure well and was released from the [facility]. She was given instructions regarding seed removal. IMPRESSION: Radioactive seed localization right breast. No apparent complications. Electronically Signed   By: Lillia Mountain M.D.   On: 12/07/2015 14:05    Impression:  Stage 1 (T1b, pN0), grade 1 invasive ductal carcinoma of the right breast (ER/PR +, HER2 -) The patient is close to being ready to proceed with adjuvant radiation therapy.  I spoke to the patient today regarding her diagnosis and  options for treatment. We discussed the equivalence in terms of survival and local failure between mastectomy and breast conservation. We discussed the role of radiation in decreasing local failures in patients who undergo lumpectomy. We discussed the process of simulation and the placement tattoos. We discussed 4-6 weeks of treatment as an outpatient. We discussed the possibility of asymptomatic lung damage. We discussed the low likelihood of secondary malignancies. We discussed the possible side effects including but not limited to skin redness, fatigue, permanent skin darkening, and breast swelling.   Plan:   Simulation and planning scheduled for 01/09/16 at 3 pm. Treatments to start November 27th approximately 6 weeks post surgery.   ____________________________________  This document serves as a record of services personally performed by Gery Pray, MD. It was created on his behalf by Bethann Humble, a trained medical scribe. The creation of this record is based on the scribe's personal observations and the provider's statements to them. This document has been checked and approved by the attending provider.

## 2016-01-06 MED FILL — SYNTHROID 100 MCG TABLET: 100 | 90 days supply | Qty: 90 | Fill #0

## 2016-01-09 ENCOUNTER — Ambulatory Visit
Admission: RE | Admit: 2016-01-09 | Discharge: 2016-01-09 | Disposition: A | Discharge status: Home / Self Care | Payer: 59 | Source: Ambulatory Visit | Attending: Radiation Oncology | Admitting: Radiation Oncology

## 2016-01-09 DIAGNOSIS — C50211 Malignant neoplasm of upper-inner quadrant of right female breast: Secondary | ICD-10-CM

## 2016-01-09 DIAGNOSIS — Z510 Encounter for antineoplastic radiation therapy: Secondary | ICD-10-CM | POA: Diagnosis not present

## 2016-01-09 DIAGNOSIS — Z79899 Other long term (current) drug therapy: Secondary | ICD-10-CM | POA: Diagnosis not present

## 2016-01-09 DIAGNOSIS — Z170 Estrogen receptor positive status [ER+]: Principal | ICD-10-CM

## 2016-01-09 NOTE — Progress Notes (Signed)
  Radiation Oncology         [phone]) [phone] ________________________________  Name: Marie Wilson MRN: [id number]  Date: 01/09/2016  DOB: December 29, 1965  Optical Surface Tracking Plan:  Since intensity modulated radiotherapy (IMRT) and 3D conformal radiation treatment methods are predicated on accurate and precise positioning for treatment, intrafraction motion monitoring is medically necessary to ensure accurate and safe treatment delivery.  The ability to quantify intrafraction motion without excessive ionizing radiation dose can only be performed with optical surface tracking. Accordingly, surface imaging offers the opportunity to obtain 3D measurements of patient position throughout IMRT and 3D treatments without excessive radiation exposure.  I am ordering optical surface tracking for this patient's upcoming course of radiotherapy. ________________________________  Gery Pray, MD 01/09/2016 7:23 PM    Reference:   Particia Jasper, et al. Surface imaging-based analysis of intrafraction motion for breast radiotherapy patients.Journal of [place], n. 6, nov. 2014. ISSN [id number].   Available at: <http://www.jacmp.org/index.php/jacmp/article/view/4957>.

## 2016-01-09 NOTE — Progress Notes (Signed)
  Radiation Oncology         [phone] ________________________________  Name: Marie Wilson MRN: [id number]  Date: 01/09/2016  DOB: [birth year]  SIMULATION AND TREATMENT PLANNING NOTE    ICD-9-CM ICD-10-CM   1. Malignant neoplasm of upper-inner quadrant of right breast in female, estrogen receptor positive ([place]) 174.2 C50.211    V86.0 Z17.0     DIAGNOSIS:   Stage IA (T1b, pN0), grade 1 invasive ductal carcinoma of the right breast (ER/PR +, HER2 -)   NARRATIVE:  The patient was brought to the [facility].  Identity was confirmed.  All relevant records and images related to the planned course of therapy were reviewed.  The patient freely provided informed written consent to proceed with treatment after reviewing the details related to the planned course of therapy. The consent form was witnessed and verified by the simulation staff.  Then, the patient was set-up in a stable reproducible  supine position for radiation therapy.  CT images were obtained.  Surface markings were placed.  The CT images were loaded into the planning software.  Then the target and avoidance structures were contoured.  Treatment planning then occurred.  The radiation prescription was entered and confirmed.  Then, I designed and supervised the construction of a total of 3 medically necessary complex treatment devices.  I have requested : 3D Simulation  I have requested a DVH of the following structures: Heart, lungs, lumpectomy cavity.  I have ordered:CBC  PLAN:  The patient will receive 50.4 Gy in 28 fractions followed by a boost to the lumpectomy cavity of 10 Gy.  -----------------------------------  Blair Promise, PhD, MD

## 2016-01-17 DIAGNOSIS — C50211 Malignant neoplasm of upper-inner quadrant of right female breast: Secondary | ICD-10-CM | POA: Diagnosis not present

## 2016-01-17 DIAGNOSIS — Z79899 Other long term (current) drug therapy: Secondary | ICD-10-CM | POA: Diagnosis not present

## 2016-01-17 DIAGNOSIS — Z170 Estrogen receptor positive status [ER+]: Secondary | ICD-10-CM | POA: Diagnosis not present

## 2016-01-17 DIAGNOSIS — Z510 Encounter for antineoplastic radiation therapy: Secondary | ICD-10-CM | POA: Diagnosis not present

## 2016-01-18 ENCOUNTER — Ambulatory Visit
Admission: RE | Admit: 2016-01-18 | Discharge: 2016-01-18 | Disposition: A | Discharge status: Home / Self Care | Payer: 59 | Source: Ambulatory Visit | Attending: Radiation Oncology | Admitting: Radiation Oncology

## 2016-01-18 ENCOUNTER — Encounter: Payer: Self-pay | Admitting: *Deleted

## 2016-01-18 DIAGNOSIS — Z170 Estrogen receptor positive status [ER+]: Secondary | ICD-10-CM | POA: Diagnosis not present

## 2016-01-18 DIAGNOSIS — Z79899 Other long term (current) drug therapy: Secondary | ICD-10-CM | POA: Diagnosis not present

## 2016-01-18 DIAGNOSIS — Z510 Encounter for antineoplastic radiation therapy: Secondary | ICD-10-CM | POA: Diagnosis not present

## 2016-01-18 DIAGNOSIS — C50211 Malignant neoplasm of upper-inner quadrant of right female breast: Secondary | ICD-10-CM | POA: Diagnosis not present

## 2016-01-18 NOTE — Progress Notes (Signed)
[place] Psychosocial Distress Screening Clinical Social Work  Clinical Social Work was referred by distress screening protocol.  The patient scored a 5 on the Psychosocial Distress Thermometer which indicates mild distress. Clinical Social Worker phoned pt to assess for distress and other psychosocial needs. CSW explained role of CSW team/Support Center and reviewed options for support. Pt shared concerns about how her radiation will be billed and CSW attempted to answer, but will refer to [place] for more details to assist patient. Pt appears to be coping adequately and CSW reviewed common emotions, coping techniques and resources to assist. CSW to prepare packet of info and provide to pt at her appointment later this afternoon. Pt appreciated call and will reach out to [place] team as needed.   ONCBCN DISTRESS SCREENING 01/04/2016  Screening Type Initial Screening  Distress experienced in past week (1-10) 5  Practical problem type Insurance  Emotional problem type Adjusting to illness  Physical Problem type Sleep/insomnia  Physician notified of physical symptoms   Referral to clinical psychology   Referral to clinical social work   Referral to dietition   Referral to financial advocate   Referral to support programs   Referral to palliative care     Clinical Social Worker follow up needed: Yes.    If yes, follow up plan:  See above Loren Racer, [place] Worker [place]  [facility] Phone: [phone] Fax: [phone]

## 2016-01-18 NOTE — Progress Notes (Signed)
  Radiation Oncology         [phone] ________________________________  Name: Marie Wilson MRN: [id number]  Date: 01/18/2016  DOB: 12-Jan-1966  Simulation Verification Note    ICD-9-CM ICD-10-CM   1. Malignant neoplasm of upper-inner quadrant of right breast in female, estrogen receptor positive ([place]) 174.2 C50.211    V86.0 Z17.0     Status: outpatient  NARRATIVE: The patient was brought to the treatment unit and placed in the planned treatment position. The clinical setup was verified. Then port films were obtained and uploaded to the radiation oncology medical record software.  The treatment beams were carefully compared against the planned radiation fields. The position location and shape of the radiation fields was reviewed. They targeted volume of tissue appears to be appropriately covered by the radiation beams. Organs at risk appear to be excluded as planned.  Based on my personal review, I approved the simulation verification. The patient's treatment will proceed as planned.  -----------------------------------  Blair Promise, PhD, MD

## 2016-01-19 ENCOUNTER — Ambulatory Visit
Admission: RE | Admit: 2016-01-19 | Discharge: 2016-01-19 | Disposition: A | Discharge status: Home / Self Care | Payer: 59 | Source: Ambulatory Visit | Attending: Radiation Oncology | Admitting: Radiation Oncology

## 2016-01-19 DIAGNOSIS — Z79899 Other long term (current) drug therapy: Secondary | ICD-10-CM | POA: Diagnosis not present

## 2016-01-19 DIAGNOSIS — C50211 Malignant neoplasm of upper-inner quadrant of right female breast: Secondary | ICD-10-CM | POA: Diagnosis not present

## 2016-01-19 DIAGNOSIS — Z170 Estrogen receptor positive status [ER+]: Secondary | ICD-10-CM | POA: Diagnosis not present

## 2016-01-19 DIAGNOSIS — Z510 Encounter for antineoplastic radiation therapy: Secondary | ICD-10-CM | POA: Diagnosis not present

## 2016-01-20 ENCOUNTER — Ambulatory Visit
Admission: RE | Admit: 2016-01-20 | Discharge: 2016-01-20 | Disposition: A | Discharge status: Home / Self Care | Payer: 59 | Source: Ambulatory Visit | Attending: Radiation Oncology | Admitting: Radiation Oncology

## 2016-01-20 DIAGNOSIS — C50211 Malignant neoplasm of upper-inner quadrant of right female breast: Secondary | ICD-10-CM

## 2016-01-20 DIAGNOSIS — Z170 Estrogen receptor positive status [ER+]: Principal | ICD-10-CM

## 2016-01-20 DIAGNOSIS — Z79899 Other long term (current) drug therapy: Secondary | ICD-10-CM | POA: Diagnosis not present

## 2016-01-20 DIAGNOSIS — Z510 Encounter for antineoplastic radiation therapy: Secondary | ICD-10-CM | POA: Diagnosis not present

## 2016-01-20 MED ORDER — RADIAPLEXRX EX GEL
Freq: Once | CUTANEOUS | Status: AC
  Administered 2016-01-20: 13:00:00 via TOPICAL

## 2016-01-20 MED ORDER — ALRA NON-METALLIC DEODORANT (RAD-ONC)
1.0000 "application " | Freq: Once | TOPICAL | Status: AC
  Administered 2016-01-20: 1 via TOPICAL

## 2016-01-20 NOTE — Progress Notes (Signed)
Pt here for patient teaching.  Pt given Radiation and You booklet, Managing Acute Radiation Side Effects for Head and Neck Cancer handout, skin care instructions, Alra deodorant and Radiaplex gel. Pt reports they have not watched the Radiation Therapy Education video and has been give the link to watch at home.  Reviewed areas of pertinence such as fatigue, skin changes, breast tenderness and breast swelling . Pt able to give teach back of to pat skin and use unscented/gentle soap,apply Radiaplex bid, avoid applying anything to skin within 4 hours of treatment and to use an electric razor if they must shave. Pt demonstrated understanding and verbalizes understanding of information given and will contact nursing with any questions or concerns.

## 2016-01-23 ENCOUNTER — Encounter: Payer: Self-pay | Admitting: Radiation Oncology

## 2016-01-23 ENCOUNTER — Ambulatory Visit
Admission: RE | Admit: 2016-01-23 | Discharge: 2016-01-23 | Disposition: A | Discharge status: Home / Self Care | Payer: 59 | Source: Ambulatory Visit | Attending: Radiation Oncology | Admitting: Radiation Oncology

## 2016-01-23 VITALS — BP 133/93 | HR 63 | Temp 98.1°F | Ht 67.0 in | Wt 327.2 lb

## 2016-01-23 DIAGNOSIS — Z170 Estrogen receptor positive status [ER+]: Secondary | ICD-10-CM | POA: Diagnosis not present

## 2016-01-23 DIAGNOSIS — Z510 Encounter for antineoplastic radiation therapy: Secondary | ICD-10-CM | POA: Diagnosis not present

## 2016-01-23 DIAGNOSIS — C50211 Malignant neoplasm of upper-inner quadrant of right female breast: Secondary | ICD-10-CM

## 2016-01-23 DIAGNOSIS — Z79899 Other long term (current) drug therapy: Secondary | ICD-10-CM | POA: Diagnosis not present

## 2016-01-23 NOTE — Progress Notes (Signed)
  Radiation Oncology         [phone] ________________________________  Name: Marie Wilson MRN: [id number]  Date: 01/23/2016  DOB: Nov 25, 1965  Weekly Radiation Therapy Management    ICD-9-CM ICD-10-CM   1. Malignant neoplasm of upper-inner quadrant of right breast in female, estrogen receptor positive ([place]) 174.2 C50.211    V86.0 Z17.0      Current Dose: 5.4 Gy     Planned Dose:  60.4 Gy  Narrative . . . . . . . . The patient presents for routine under treatment assessment.  Bryanah Nurmi has competed 3 fractions to her right breast.  She denies having pain. She reports having fatigue. The skin on her right breast is pink and she is using radiaplex.                                  Set-up films were reviewed.                                 The chart was checked. Physical Findings. . .  height is 5\' 7"  (1.702 m) and weight is 327 lb 3.2 oz (148.4 kg) (abnormal). Her oral temperature is 98.1 F (36.7 C). Her blood pressure is 133/93 (abnormal) and her pulse is 63. Her oxygen saturation is 98%. .  Slight erythema of the right breast. Lungs clear. Heart regular rhythm and rate. Impression . . . . . . . The patient is tolerating radiation. Plan . . . . . . . . . . . . Continue treatment as planned.  ________________________________   Blair Promise, PhD, MD  This document serves as a record of services personally performed by Gery Pray, MD. It was created on his behalf by Darcus Austin, a trained medical scribe. The creation of this record is based on the scribe's personal observations and the provider's statements to them. This document has been checked and approved by the attending provider.

## 2016-01-23 NOTE — Progress Notes (Signed)
Marie Wilson has competed 3 fractions to her right breast.  She denies having pain.  She reports having fatigue.  She is using radiaplex.  The skin on her right breast is pink.   BP (!) 133/93 (BP Location: Left Arm, Patient Position: Sitting)   Pulse 63   Temp 98.1 F (36.7 C) (Oral)   Ht 5\' 7"  (1.702 m)   Wt (!) 327 lb 3.2 oz (148.4 kg)   LMP  (LMP Unknown)   SpO2 98%   BMI 51.25 kg/m    Wt Readings from Last 3 Encounters:  01/23/16 (!) 327 lb 3.2 oz (148.4 kg)  01/04/16 (!) 328 lb 3.2 oz (148.9 kg)  12/14/15 (!) 331 lb 11.2 oz (150.5 kg)

## 2016-01-24 ENCOUNTER — Ambulatory Visit: Payer: 59

## 2016-01-24 ENCOUNTER — Ambulatory Visit: Payer: 59 | Admitting: Radiation Oncology

## 2016-01-25 ENCOUNTER — Ambulatory Visit
Admission: RE | Admit: 2016-01-25 | Discharge: 2016-01-25 | Disposition: A | Discharge status: Home / Self Care | Payer: 59 | Source: Ambulatory Visit | Attending: Radiation Oncology | Admitting: Radiation Oncology

## 2016-01-25 DIAGNOSIS — C50211 Malignant neoplasm of upper-inner quadrant of right female breast: Secondary | ICD-10-CM | POA: Diagnosis not present

## 2016-01-25 DIAGNOSIS — Z79899 Other long term (current) drug therapy: Secondary | ICD-10-CM | POA: Diagnosis not present

## 2016-01-25 DIAGNOSIS — Z170 Estrogen receptor positive status [ER+]: Secondary | ICD-10-CM | POA: Diagnosis not present

## 2016-01-25 DIAGNOSIS — Z510 Encounter for antineoplastic radiation therapy: Secondary | ICD-10-CM | POA: Diagnosis not present

## 2016-01-26 ENCOUNTER — Telehealth: Payer: Self-pay | Admitting: *Deleted

## 2016-01-26 ENCOUNTER — Ambulatory Visit
Admission: RE | Admit: 2016-01-26 | Discharge: 2016-01-26 | Disposition: A | Discharge status: Home / Self Care | Payer: 59 | Source: Ambulatory Visit | Attending: Radiation Oncology | Admitting: Radiation Oncology

## 2016-01-26 DIAGNOSIS — Z510 Encounter for antineoplastic radiation therapy: Secondary | ICD-10-CM | POA: Diagnosis not present

## 2016-01-26 DIAGNOSIS — C50211 Malignant neoplasm of upper-inner quadrant of right female breast: Secondary | ICD-10-CM | POA: Diagnosis not present

## 2016-01-26 DIAGNOSIS — Z79899 Other long term (current) drug therapy: Secondary | ICD-10-CM | POA: Diagnosis not present

## 2016-01-26 DIAGNOSIS — Z170 Estrogen receptor positive status [ER+]: Secondary | ICD-10-CM | POA: Diagnosis not present

## 2016-01-26 NOTE — Telephone Encounter (Signed)
  Oncology Nurse Navigator Documentation  Navigator Location: [facility] (01/26/16 0900)   )Navigator Encounter Type: Telephone (01/26/16 0900) Telephone: [facility] Call (01/26/16 0900)     Surgery Date: 12/08/15 (01/26/16 0900) Genetic Counseling Date: 11/09/15 (01/26/16 0900) Genetic Counseling Type: Urgent (01/26/16 0900)   Multidisiplinary Clinic Date: 11/02/15 (01/26/16 0900) Multidisiplinary Clinic Type: Breast (01/26/16 0900)   Patient Visit Type: RadOnc (01/26/16 0900) Treatment Phase: First Radiation Tx (01/26/16 0900)  Left vm to assess needs during xrt. Contact information provided.                          Time Spent with Patient: 15 (01/26/16 0900)

## 2016-01-27 ENCOUNTER — Ambulatory Visit
Admission: RE | Admit: 2016-01-27 | Discharge: 2016-01-27 | Disposition: A | Discharge status: Home / Self Care | Payer: 59 | Source: Ambulatory Visit | Attending: Radiation Oncology | Admitting: Radiation Oncology

## 2016-01-27 DIAGNOSIS — C50211 Malignant neoplasm of upper-inner quadrant of right female breast: Secondary | ICD-10-CM | POA: Diagnosis not present

## 2016-01-27 DIAGNOSIS — Z79899 Other long term (current) drug therapy: Secondary | ICD-10-CM | POA: Diagnosis not present

## 2016-01-27 DIAGNOSIS — Z510 Encounter for antineoplastic radiation therapy: Secondary | ICD-10-CM | POA: Diagnosis not present

## 2016-01-27 DIAGNOSIS — Z170 Estrogen receptor positive status [ER+]: Secondary | ICD-10-CM | POA: Diagnosis not present

## 2016-01-30 ENCOUNTER — Ambulatory Visit
Admission: RE | Admit: 2016-01-30 | Discharge: 2016-01-30 | Disposition: A | Discharge status: Home / Self Care | Payer: 59 | Source: Ambulatory Visit | Attending: Radiation Oncology | Admitting: Radiation Oncology

## 2016-01-30 DIAGNOSIS — Z79899 Other long term (current) drug therapy: Secondary | ICD-10-CM | POA: Diagnosis not present

## 2016-01-30 DIAGNOSIS — Z170 Estrogen receptor positive status [ER+]: Secondary | ICD-10-CM | POA: Diagnosis not present

## 2016-01-30 DIAGNOSIS — Z510 Encounter for antineoplastic radiation therapy: Secondary | ICD-10-CM | POA: Diagnosis not present

## 2016-01-30 DIAGNOSIS — C50211 Malignant neoplasm of upper-inner quadrant of right female breast: Secondary | ICD-10-CM | POA: Diagnosis not present

## 2016-01-31 ENCOUNTER — Encounter: Payer: Self-pay | Admitting: Radiation Oncology

## 2016-01-31 ENCOUNTER — Ambulatory Visit
Admission: RE | Admit: 2016-01-31 | Discharge: 2016-01-31 | Disposition: A | Discharge status: Home / Self Care | Payer: 59 | Source: Ambulatory Visit | Attending: Radiation Oncology | Admitting: Radiation Oncology

## 2016-01-31 VITALS — BP 112/76 | HR 72 | Temp 98.1°F | Resp 18 | Wt 329.4 lb

## 2016-01-31 DIAGNOSIS — Z79899 Other long term (current) drug therapy: Secondary | ICD-10-CM | POA: Diagnosis not present

## 2016-01-31 DIAGNOSIS — Z170 Estrogen receptor positive status [ER+]: Principal | ICD-10-CM

## 2016-01-31 DIAGNOSIS — C50211 Malignant neoplasm of upper-inner quadrant of right female breast: Secondary | ICD-10-CM | POA: Diagnosis not present

## 2016-01-31 DIAGNOSIS — Z510 Encounter for antineoplastic radiation therapy: Secondary | ICD-10-CM | POA: Diagnosis not present

## 2016-01-31 NOTE — Progress Notes (Signed)
  Radiation Oncology         [phone] ________________________________  Name: Marie Wilson MRN: [id number]  Date: 01/31/2016  DOB: 1965-09-26  Weekly Radiation Therapy Management    ICD-9-CM ICD-10-CM   1. Malignant neoplasm of upper-inner quadrant of right breast in female, estrogen receptor positive ([place]) 174.2 C50.211    V86.0 Z17.0      Current Dose: 14.4 Gy     Planned Dose:  60.4 Gy  Narrative . . . . . . . . The patient presents for routine under treatment assessment.  The patient has completed 8 fractions to the right breast. Denies pain. She reports occasional "twinges" in the right breast, and uses radiaplex as directed. She reports a good appetite.                                  Set-up films were reviewed.                                 The chart was checked. Physical Findings. . .  weight is 329 lb 6.4 oz (149.4 kg) (abnormal). Her oral temperature is 98.1 F (36.7 C). Her blood pressure is 112/76 and her pulse is 72. Her respiration is 18.  Lungs clear to auscultation bilaterally. Heart regular in rhythm and rate. Slight erythema to the right breast. Impression . . . . . . . The patient is tolerating radiation. Plan . . . . . . . . . . . . Continue treatment as planned.  ________________________________   Blair Promise, PhD, MD  This document serves as a record of services personally performed by Gery Pray, MD. It was created on his behalf by Maryla Morrow, a trained medical scribe. The creation of this record is based on the scribe's personal observations and the provider's statements to them. This document has been checked and approved by the attending provider.

## 2016-01-31 NOTE — Progress Notes (Signed)
Weekly rad txs right breast 8/33 completed, mild pink color on breast,skinintact, occasional twinges in that breasty, uses radiaplex bid, appetite good, no pain 3:13 PM BP 112/76 (BP Location: Left Arm, Patient Position: Sitting, Cuff Size: Large)   Pulse 72   Temp 98.1 F (36.7 C) (Oral)   Resp 18   Wt (!) 329 lb 6.4 oz (149.4 kg)   LMP  (LMP Unknown)   BMI 51.59 kg/m   Wt Readings from Last 3 Encounters:  01/31/16 (!) 329 lb 6.4 oz (149.4 kg)  01/23/16 (!) 327 lb 3.2 oz (148.4 kg)  01/04/16 (!) 328 lb 3.2 oz (148.9 kg)

## 2016-02-01 ENCOUNTER — Ambulatory Visit
Admission: RE | Admit: 2016-02-01 | Discharge: 2016-02-01 | Disposition: A | Discharge status: Home / Self Care | Payer: 59 | Source: Ambulatory Visit | Attending: Radiation Oncology | Admitting: Radiation Oncology

## 2016-02-01 DIAGNOSIS — Z510 Encounter for antineoplastic radiation therapy: Secondary | ICD-10-CM | POA: Diagnosis not present

## 2016-02-01 DIAGNOSIS — Z170 Estrogen receptor positive status [ER+]: Secondary | ICD-10-CM | POA: Diagnosis not present

## 2016-02-01 DIAGNOSIS — C50211 Malignant neoplasm of upper-inner quadrant of right female breast: Secondary | ICD-10-CM | POA: Diagnosis not present

## 2016-02-01 DIAGNOSIS — Z79899 Other long term (current) drug therapy: Secondary | ICD-10-CM | POA: Diagnosis not present

## 2016-02-02 ENCOUNTER — Ambulatory Visit
Admission: RE | Admit: 2016-02-02 | Discharge: 2016-02-02 | Disposition: A | Discharge status: Home / Self Care | Payer: 59 | Source: Ambulatory Visit | Attending: Radiation Oncology | Admitting: Radiation Oncology

## 2016-02-02 DIAGNOSIS — Z170 Estrogen receptor positive status [ER+]: Secondary | ICD-10-CM | POA: Diagnosis not present

## 2016-02-02 DIAGNOSIS — Z510 Encounter for antineoplastic radiation therapy: Secondary | ICD-10-CM | POA: Diagnosis not present

## 2016-02-02 DIAGNOSIS — Z79899 Other long term (current) drug therapy: Secondary | ICD-10-CM | POA: Diagnosis not present

## 2016-02-02 DIAGNOSIS — C50211 Malignant neoplasm of upper-inner quadrant of right female breast: Secondary | ICD-10-CM | POA: Diagnosis not present

## 2016-02-03 ENCOUNTER — Ambulatory Visit
Admission: RE | Admit: 2016-02-03 | Discharge: 2016-02-03 | Disposition: A | Discharge status: Home / Self Care | Payer: 59 | Source: Ambulatory Visit | Attending: Radiation Oncology | Admitting: Radiation Oncology

## 2016-02-03 DIAGNOSIS — Z79899 Other long term (current) drug therapy: Secondary | ICD-10-CM | POA: Diagnosis not present

## 2016-02-03 DIAGNOSIS — Z510 Encounter for antineoplastic radiation therapy: Secondary | ICD-10-CM | POA: Diagnosis not present

## 2016-02-03 DIAGNOSIS — Z170 Estrogen receptor positive status [ER+]: Secondary | ICD-10-CM | POA: Diagnosis not present

## 2016-02-03 DIAGNOSIS — C50211 Malignant neoplasm of upper-inner quadrant of right female breast: Secondary | ICD-10-CM | POA: Diagnosis not present

## 2016-02-06 ENCOUNTER — Ambulatory Visit
Admission: RE | Admit: 2016-02-06 | Discharge: 2016-02-06 | Disposition: A | Discharge status: Home / Self Care | Payer: 59 | Source: Ambulatory Visit | Attending: Radiation Oncology | Admitting: Radiation Oncology

## 2016-02-06 DIAGNOSIS — Z79899 Other long term (current) drug therapy: Secondary | ICD-10-CM | POA: Diagnosis not present

## 2016-02-06 DIAGNOSIS — C50211 Malignant neoplasm of upper-inner quadrant of right female breast: Secondary | ICD-10-CM | POA: Diagnosis not present

## 2016-02-06 DIAGNOSIS — Z510 Encounter for antineoplastic radiation therapy: Secondary | ICD-10-CM | POA: Diagnosis not present

## 2016-02-06 DIAGNOSIS — Z170 Estrogen receptor positive status [ER+]: Secondary | ICD-10-CM | POA: Diagnosis not present

## 2016-02-07 ENCOUNTER — Ambulatory Visit
Admission: RE | Admit: 2016-02-07 | Discharge: 2016-02-07 | Disposition: A | Discharge status: Home / Self Care | Payer: 59 | Source: Ambulatory Visit | Attending: Radiation Oncology | Admitting: Radiation Oncology

## 2016-02-07 ENCOUNTER — Encounter: Payer: Self-pay | Admitting: Radiation Oncology

## 2016-02-07 VITALS — BP 135/82 | HR 79 | Temp 98.5°F | Resp 20 | Wt 330.0 lb

## 2016-02-07 DIAGNOSIS — Z510 Encounter for antineoplastic radiation therapy: Secondary | ICD-10-CM | POA: Diagnosis not present

## 2016-02-07 DIAGNOSIS — Z170 Estrogen receptor positive status [ER+]: Secondary | ICD-10-CM | POA: Diagnosis not present

## 2016-02-07 DIAGNOSIS — C50211 Malignant neoplasm of upper-inner quadrant of right female breast: Secondary | ICD-10-CM | POA: Insufficient documentation

## 2016-02-07 DIAGNOSIS — Z79899 Other long term (current) drug therapy: Secondary | ICD-10-CM | POA: Diagnosis not present

## 2016-02-07 MED ORDER — RADIAPLEXRX EX GEL
Freq: Once | CUTANEOUS | Status: AC
  Administered 2016-02-07: 16:00:00 via TOPICAL

## 2016-02-07 NOTE — Progress Notes (Signed)
  Radiation Oncology         [phone] ________________________________  Name: Marie Wilson MRN: [id number]  Date: 02/07/2016  DOB: [birth year]  Weekly Radiation Therapy Management    ICD-9-CM ICD-10-CM   1. Malignant neoplasm of upper-inner quadrant of right breast in female, estrogen receptor positive ([name]) 174.2 C50.211 hyaluronate sodium (RADIAPLEXRX) gel   V86.0 Z17.0     Current Dose: 23.4 Gy     Planned Dose:  60.4 Gy  Narrative . . . . . . . . The patient presents for routine under treatment assessment.  Weekly rad txs right breast 13/33 completed. Mild erythema, under inframmary fold, slight peeling, noted by the nurse. Reports occasional twinges in the right breast and some pruritus in the right inframammary fold. Using radiaplex bid and another tube was provided per patient request. Good appetite and no c/o pain.                                  Set-up films were reviewed.                                 The chart was checked. Physical Findings. . .  weight is 330 lb (149.7 kg) (abnormal). Her oral temperature is 98.5 F (36.9 C). Her blood pressure is 135/82 and her pulse is 79. Her respiration is 20.  Lungs clear to auscultation bilaterally. Heart regular in rhythm and rate. Some erythema to the right inframammary fold. Mild hyperpigmentation changes throughout the right breast. Impression . . . . . . . The patient is tolerating radiation. Plan . . . . . . . . . . . . Continue treatment as planned.  ________________________________   Blair Promise, PhD, MD  This document serves as a record of services personally performed by Gery Pray, MD. It was created on his behalf by Darcus Austin, a trained medical scribe. The creation of this record is based on the scribe's personal observations and the provider's statements to them. This document has been checked and approved by the attending provider.

## 2016-02-07 NOTE — Progress Notes (Signed)
Weekly rad txs right breast 13/33 completed, mild erythema , under inframmary fold, peeling slight, occasional twinges in breast stated, using radiaplex bid, gave another tube per patient request, appetite good, no c/o pain 3:24 PM BP 135/82 (BP Location: Left Arm, Patient Position: Sitting, Cuff Size: Large)   Pulse 79   Temp 98.5 F (36.9 C) (Oral)   Resp 20   Wt (!) 330 lb (149.7 kg)   LMP  (LMP Unknown)   BMI 51.69 kg/m  Wt Readings from Last 3 Encounters:  02/07/16 (!) 330 lb (149.7 kg)  01/31/16 (!) 329 lb 6.4 oz (149.4 kg)  01/23/16 (!) 327 lb 3.2 oz (148.4 kg)

## 2016-02-08 ENCOUNTER — Ambulatory Visit
Admission: RE | Admit: 2016-02-08 | Discharge: 2016-02-08 | Disposition: A | Discharge status: Home / Self Care | Payer: 59 | Source: Ambulatory Visit | Attending: Radiation Oncology | Admitting: Radiation Oncology

## 2016-02-08 DIAGNOSIS — Z79899 Other long term (current) drug therapy: Secondary | ICD-10-CM | POA: Diagnosis not present

## 2016-02-08 DIAGNOSIS — Z170 Estrogen receptor positive status [ER+]: Secondary | ICD-10-CM | POA: Diagnosis not present

## 2016-02-08 DIAGNOSIS — C50211 Malignant neoplasm of upper-inner quadrant of right female breast: Secondary | ICD-10-CM | POA: Diagnosis not present

## 2016-02-08 DIAGNOSIS — Z510 Encounter for antineoplastic radiation therapy: Secondary | ICD-10-CM | POA: Diagnosis not present

## 2016-02-09 ENCOUNTER — Ambulatory Visit
Admission: RE | Admit: 2016-02-09 | Discharge: 2016-02-09 | Disposition: A | Discharge status: Home / Self Care | Payer: 59 | Source: Ambulatory Visit | Attending: Radiation Oncology | Admitting: Radiation Oncology

## 2016-02-09 DIAGNOSIS — Z170 Estrogen receptor positive status [ER+]: Secondary | ICD-10-CM | POA: Diagnosis not present

## 2016-02-09 DIAGNOSIS — Z510 Encounter for antineoplastic radiation therapy: Secondary | ICD-10-CM | POA: Diagnosis not present

## 2016-02-09 DIAGNOSIS — Z79899 Other long term (current) drug therapy: Secondary | ICD-10-CM | POA: Diagnosis not present

## 2016-02-09 DIAGNOSIS — C50211 Malignant neoplasm of upper-inner quadrant of right female breast: Secondary | ICD-10-CM | POA: Diagnosis not present

## 2016-02-10 ENCOUNTER — Ambulatory Visit
Admission: RE | Admit: 2016-02-10 | Discharge: 2016-02-10 | Disposition: A | Discharge status: Home / Self Care | Payer: 59 | Source: Ambulatory Visit | Attending: Radiation Oncology | Admitting: Radiation Oncology

## 2016-02-10 DIAGNOSIS — C50211 Malignant neoplasm of upper-inner quadrant of right female breast: Secondary | ICD-10-CM | POA: Diagnosis not present

## 2016-02-10 DIAGNOSIS — Z79899 Other long term (current) drug therapy: Secondary | ICD-10-CM | POA: Diagnosis not present

## 2016-02-10 DIAGNOSIS — Z510 Encounter for antineoplastic radiation therapy: Secondary | ICD-10-CM | POA: Diagnosis not present

## 2016-02-10 DIAGNOSIS — Z170 Estrogen receptor positive status [ER+]: Secondary | ICD-10-CM | POA: Diagnosis not present

## 2016-02-14 ENCOUNTER — Ambulatory Visit
Admission: RE | Admit: 2016-02-14 | Discharge: 2016-02-14 | Disposition: A | Discharge status: Home / Self Care | Payer: 59 | Source: Ambulatory Visit | Attending: Radiation Oncology | Admitting: Radiation Oncology

## 2016-02-14 ENCOUNTER — Encounter: Payer: Self-pay | Admitting: Radiation Oncology

## 2016-02-14 VITALS — BP 117/74 | HR 84 | Temp 97.9°F | Ht 67.0 in | Wt 327.6 lb

## 2016-02-14 DIAGNOSIS — C50211 Malignant neoplasm of upper-inner quadrant of right female breast: Secondary | ICD-10-CM | POA: Diagnosis not present

## 2016-02-14 DIAGNOSIS — Z170 Estrogen receptor positive status [ER+]: Secondary | ICD-10-CM | POA: Diagnosis not present

## 2016-02-14 DIAGNOSIS — Z79899 Other long term (current) drug therapy: Secondary | ICD-10-CM | POA: Diagnosis not present

## 2016-02-14 DIAGNOSIS — Z510 Encounter for antineoplastic radiation therapy: Secondary | ICD-10-CM | POA: Diagnosis not present

## 2016-02-14 NOTE — Progress Notes (Signed)
Ms. Marie Wilson presents for her 17th fraction of radiation to her Right Breast. She reports pain to the area underneath her Right Breast a 8/10. She has peeling to this area. She has been using neosporin to this area. There is no drainage present. The Right Breast is red. She also continues to use Radiaplex twice daily as directed.   BP 117/74   Pulse 84   Temp 97.9 F (36.6 C)   Ht 5\' 7"  (1.702 m)   Wt (!) 327 lb 9.6 oz (148.6 kg)   LMP  (LMP Unknown)   SpO2 97% Comment: room air  BMI 51.31 kg/m    Wt Readings from Last 3 Encounters:  02/14/16 (!) 327 lb 9.6 oz (148.6 kg)  02/07/16 (!) 330 lb (149.7 kg)  01/31/16 (!) 329 lb 6.4 oz (149.4 kg)

## 2016-02-14 NOTE — Addendum Note (Signed)
Encounter addended by: Heywood Footman, RN on: 02/14/2016  4:28 PM<BR>    Actions taken: Sign clinical note

## 2016-02-14 NOTE — Progress Notes (Signed)
   Weekly Management Note:  Outpatient    ICD-9-CM ICD-10-CM   1. Malignant neoplasm of upper-inner quadrant of right breast in female, estrogen receptor positive ([name]) 174.2 C50.211    V86.0 Z17.0     Current Dose:  30.6 Gy  Projected Dose: 60.4 Gy   Narrative:  The patient presents for routine under treatment assessment.  CBCT/MVCT images/Port film x-rays were reviewed.  The chart was checked.   Ms. Zingsheim presents for her 17th fraction of radiation to her Right Breast. She reports pain to the area underneath her Right Breast a 8/10. She is requesting something to put on this area to resolve pain and itching. She has peeling to this area. She has been using neosporin to this area. There is no drainage present. The Right Breast is red. She denies ever having yeast infections in this area. She also continues to use Radiaplex twice daily as directed. The patient is curious if she can have the day off tomorrow from treatment. She has family in town and would like to do things with them tomorrow.   Physical Findings:  height is 5\' 7"  (1.702 m) and weight is 327 lb 9.6 oz (148.6 kg) (abnormal). Her temperature is 97.9 F (36.6 C). Her blood pressure is 117/74 and her pulse is 84. Her oxygen saturation is 97%.   Wt Readings from Last 3 Encounters:  02/14/16 (!) 327 lb 9.6 oz (148.6 kg)  02/07/16 (!) 330 lb (149.7 kg)  01/31/16 (!) 329 lb 6.4 oz (149.4 kg)   She has mild erythema of the breast. Early moist desquamation and bright erythema at IM fold, right.  Impression:  The patient is tolerating radiotherapy.  Plan:  Continue radiotherapy as planned. I advised the patient it is best not to miss treatment days and she will speak with the therapists at her machine to possibly reschedule her treatment time tomorrow to the AM or late PM. She was given samples of triple antibiotics ointment and hydrogel pads to use . Dr. Sondra Come may consider an anti-yeast medication next week if the area appears to be  more like a yeast infection despite this plan.   ________________________________   Eppie Gibson, M.D.   This document serves as a record of services personally performed by Eppie Gibson, MD. It was created on her behalf by Arlyce Harman, a trained medical scribe. The creation of this record is based on the scribe's personal observations and the provider's statements to them. This document has been checked and approved by the attending provider.

## 2016-02-14 NOTE — Progress Notes (Signed)
Provided patient with triple action antibiotic and hydrogel pads as ordered by Dr. Isidore Moos. Patient verbalized understanding.

## 2016-02-15 ENCOUNTER — Ambulatory Visit
Admission: RE | Admit: 2016-02-15 | Discharge: 2016-02-15 | Disposition: A | Discharge status: Home / Self Care | Payer: 59 | Source: Ambulatory Visit | Attending: Radiation Oncology | Admitting: Radiation Oncology

## 2016-02-15 DIAGNOSIS — Z510 Encounter for antineoplastic radiation therapy: Secondary | ICD-10-CM | POA: Diagnosis not present

## 2016-02-15 DIAGNOSIS — Z170 Estrogen receptor positive status [ER+]: Secondary | ICD-10-CM | POA: Diagnosis not present

## 2016-02-15 DIAGNOSIS — Z79899 Other long term (current) drug therapy: Secondary | ICD-10-CM | POA: Diagnosis not present

## 2016-02-15 DIAGNOSIS — C50211 Malignant neoplasm of upper-inner quadrant of right female breast: Secondary | ICD-10-CM | POA: Diagnosis not present

## 2016-02-16 ENCOUNTER — Ambulatory Visit
Admission: RE | Admit: 2016-02-16 | Discharge: 2016-02-16 | Disposition: A | Discharge status: Home / Self Care | Payer: 59 | Source: Ambulatory Visit | Attending: Radiation Oncology | Admitting: Radiation Oncology

## 2016-02-16 DIAGNOSIS — C50211 Malignant neoplasm of upper-inner quadrant of right female breast: Secondary | ICD-10-CM | POA: Diagnosis not present

## 2016-02-16 DIAGNOSIS — Z510 Encounter for antineoplastic radiation therapy: Secondary | ICD-10-CM | POA: Diagnosis not present

## 2016-02-16 DIAGNOSIS — Z79899 Other long term (current) drug therapy: Secondary | ICD-10-CM | POA: Diagnosis not present

## 2016-02-16 DIAGNOSIS — Z170 Estrogen receptor positive status [ER+]: Secondary | ICD-10-CM | POA: Diagnosis not present

## 2016-02-17 ENCOUNTER — Ambulatory Visit
Admission: RE | Admit: 2016-02-17 | Discharge: 2016-02-17 | Disposition: A | Discharge status: Home / Self Care | Payer: 59 | Source: Ambulatory Visit | Attending: Radiation Oncology | Admitting: Radiation Oncology

## 2016-02-17 DIAGNOSIS — Z510 Encounter for antineoplastic radiation therapy: Secondary | ICD-10-CM | POA: Diagnosis not present

## 2016-02-17 DIAGNOSIS — C50211 Malignant neoplasm of upper-inner quadrant of right female breast: Secondary | ICD-10-CM | POA: Diagnosis not present

## 2016-02-17 DIAGNOSIS — Z79899 Other long term (current) drug therapy: Secondary | ICD-10-CM | POA: Diagnosis not present

## 2016-02-17 DIAGNOSIS — Z170 Estrogen receptor positive status [ER+]: Secondary | ICD-10-CM | POA: Diagnosis not present

## 2016-02-21 ENCOUNTER — Ambulatory Visit
Admission: RE | Admit: 2016-02-21 | Discharge: 2016-02-21 | Disposition: A | Discharge status: Home / Self Care | Payer: 59 | Source: Ambulatory Visit | Attending: Radiation Oncology | Admitting: Radiation Oncology

## 2016-02-21 ENCOUNTER — Encounter: Payer: Self-pay | Admitting: Radiation Oncology

## 2016-02-21 VITALS — BP 118/59 | HR 75 | Temp 97.7°F | Ht 67.0 in | Wt 320.4 lb

## 2016-02-21 DIAGNOSIS — N644 Mastodynia: Secondary | ICD-10-CM | POA: Insufficient documentation

## 2016-02-21 DIAGNOSIS — Z170 Estrogen receptor positive status [ER+]: Secondary | ICD-10-CM | POA: Diagnosis not present

## 2016-02-21 DIAGNOSIS — Z510 Encounter for antineoplastic radiation therapy: Secondary | ICD-10-CM | POA: Diagnosis not present

## 2016-02-21 DIAGNOSIS — C50211 Malignant neoplasm of upper-inner quadrant of right female breast: Secondary | ICD-10-CM | POA: Insufficient documentation

## 2016-02-21 DIAGNOSIS — Z79899 Other long term (current) drug therapy: Secondary | ICD-10-CM | POA: Diagnosis not present

## 2016-02-21 MED ORDER — SILVER SULFADIAZINE 1 % EX CREA
TOPICAL_CREAM | Freq: Every day | CUTANEOUS | Status: DC
  Administered 2016-02-21: 16:00:00 via TOPICAL

## 2016-02-21 MED ORDER — OXYCODONE-ACETAMINOPHEN 5-325 MG PO TABS: 1.0000 | ORAL_TABLET | Freq: Four times a day (QID) | ORAL | 0 refills | Status: DC | PRN

## 2016-02-21 MED FILL — OXYCODONE W/APAP 5/325 TAB: 5-325 | 4 days supply | Qty: 30 | Fill #0

## 2016-02-21 NOTE — Progress Notes (Signed)
Marie Wilson has completed 21 fractions to her right breast.  She reports having pain in her right breast at a 9/10.  She reports it is keeping her up at night because she can't sleep on her right side.  She reports having fatigue.  She is using radiaplex gel, triple antibiotic gel under her breast and hydrogel pads.  The skin on her right breast is red with dermatitis in the upper portion.  She does have moist desquamation under her breast.  BP (!) 118/59 (BP Location: Left Arm, Patient Position: Sitting)   Pulse 75   Temp 97.7 F (36.5 C) (Oral)   Ht 5\' 7"  (1.702 m)   Wt (!) 320 lb 6.4 oz (145.3 kg)   LMP  (LMP Unknown)   SpO2 100%   BMI 50.18 kg/m    Wt Readings from Last 3 Encounters:  02/21/16 (!) 320 lb 6.4 oz (145.3 kg)  02/14/16 (!) 327 lb 9.6 oz (148.6 kg)  02/07/16 (!) 330 lb (149.7 kg)

## 2016-02-21 NOTE — Progress Notes (Signed)
  Radiation Oncology         [phone] ________________________________  Name: Marie [facility] MRN: [id number]  Date: 02/21/2016  DOB: Mar 31, 1965  Weekly Radiation Therapy Management    ICD-9-CM ICD-10-CM   1. Malignant neoplasm of upper-inner quadrant of right breast in female, estrogen receptor positive ([place]) 174.2 C50.211    V86.0 Z17.0   2. Malignant neoplasm of upper-inner quadrant of right female breast, unspecified estrogen receptor status ([name]) 174.2 C50.211 silver sulfADIAZINE (SILVADENE) 1 % cream    Current Dose: 37.8 Gy     Planned Dose:  60.4 Gy  Narrative . . . . . . . . The patient presents for routine under treatment assessment.  Marie Wilson has completed 21 fractions to her right breast.  She reports having pain in her right breast as a 9/10.  She reports it is keeping her up at night because she can't sleep on her right side. She is taking 2 advil for the pain.  She reports having fatigue.  She is using radiaplex gel, triple antibiotic gel under her breast and hydrogel pads.  The skin on her right breast is red with dermatitis in the upper portion.  The nurse notes moist desquamation under her breast. The patient was given Percocet after surgery, and she states she tolerated it well, but discarded it after her pain subsided.                                  Set-up films were reviewed.                                 The chart was checked. Physical Findings. . .  height is 5\' 7"  (1.702 m) and weight is 320 lb 6.4 oz (145.3 kg) (abnormal). Her oral temperature is 97.7 F (36.5 C). Her blood pressure is 118/59 (abnormal) and her pulse is 75. Her oxygen saturation is 100%.  Lungs clear to auscultation bilaterally. Heart regular in rhythm and rate. Moist desquamation in the right inframammary fold. Moderate erythema throughout the remainder of the right breast. Impression . . . . . . . The patient is tolerating radiation, but is having a lot of pain at this time. Taking  tylenol and advil without relief. Plan . . . . . . . . . . . . Continue treatment as planned. We will provide silvadene to apply to the inframammary fold. I will fill Percocet for her to take primarily at night to help her sleep. ________________________________   Blair Promise, PhD, MD  This document serves as a record of services personally performed by Gery Pray, MD. It was created on his behalf by Darcus Austin, a trained medical scribe. The creation of this record is based on the scribe's personal observations and the provider's statements to them. This document has been checked and approved by the attending provider.

## 2016-02-22 ENCOUNTER — Ambulatory Visit
Admission: RE | Admit: 2016-02-22 | Discharge: 2016-02-22 | Disposition: A | Discharge status: Home / Self Care | Payer: 59 | Source: Ambulatory Visit | Attending: Radiation Oncology | Admitting: Radiation Oncology

## 2016-02-22 DIAGNOSIS — C50211 Malignant neoplasm of upper-inner quadrant of right female breast: Secondary | ICD-10-CM | POA: Diagnosis not present

## 2016-02-22 DIAGNOSIS — Z79899 Other long term (current) drug therapy: Secondary | ICD-10-CM | POA: Diagnosis not present

## 2016-02-22 DIAGNOSIS — Z510 Encounter for antineoplastic radiation therapy: Secondary | ICD-10-CM | POA: Diagnosis not present

## 2016-02-22 DIAGNOSIS — Z170 Estrogen receptor positive status [ER+]: Secondary | ICD-10-CM | POA: Diagnosis not present

## 2016-02-23 ENCOUNTER — Ambulatory Visit
Admission: RE | Admit: 2016-02-23 | Discharge: 2016-02-23 | Disposition: A | Discharge status: Home / Self Care | Payer: 59 | Source: Ambulatory Visit | Attending: Radiation Oncology | Admitting: Radiation Oncology

## 2016-02-23 DIAGNOSIS — Z79899 Other long term (current) drug therapy: Secondary | ICD-10-CM | POA: Diagnosis not present

## 2016-02-23 DIAGNOSIS — C50211 Malignant neoplasm of upper-inner quadrant of right female breast: Secondary | ICD-10-CM | POA: Diagnosis not present

## 2016-02-23 DIAGNOSIS — Z170 Estrogen receptor positive status [ER+]: Secondary | ICD-10-CM | POA: Diagnosis not present

## 2016-02-23 DIAGNOSIS — Z510 Encounter for antineoplastic radiation therapy: Secondary | ICD-10-CM | POA: Diagnosis not present

## 2016-02-24 ENCOUNTER — Ambulatory Visit
Admission: RE | Admit: 2016-02-24 | Discharge: 2016-02-24 | Disposition: A | Discharge status: Home / Self Care | Payer: 59 | Source: Ambulatory Visit | Attending: Radiation Oncology | Admitting: Radiation Oncology

## 2016-02-24 DIAGNOSIS — Z170 Estrogen receptor positive status [ER+]: Secondary | ICD-10-CM | POA: Diagnosis not present

## 2016-02-24 DIAGNOSIS — C50211 Malignant neoplasm of upper-inner quadrant of right female breast: Secondary | ICD-10-CM | POA: Diagnosis not present

## 2016-02-24 DIAGNOSIS — Z510 Encounter for antineoplastic radiation therapy: Secondary | ICD-10-CM | POA: Diagnosis not present

## 2016-02-24 DIAGNOSIS — Z79899 Other long term (current) drug therapy: Secondary | ICD-10-CM | POA: Diagnosis not present

## 2016-02-27 ENCOUNTER — Telehealth: Payer: Self-pay | Admitting: Hematology and Oncology

## 2016-02-27 ENCOUNTER — Ambulatory Visit
Admission: RE | Admit: 2016-02-27 | Discharge: 2016-02-27 | Disposition: A | Discharge status: Home / Self Care | Payer: 59 | Source: Ambulatory Visit | Attending: Radiation Oncology | Admitting: Radiation Oncology

## 2016-02-27 ENCOUNTER — Ambulatory Visit: Payer: 59 | Admitting: Radiation Oncology

## 2016-02-27 DIAGNOSIS — Z510 Encounter for antineoplastic radiation therapy: Secondary | ICD-10-CM | POA: Diagnosis not present

## 2016-02-27 DIAGNOSIS — Z79899 Other long term (current) drug therapy: Secondary | ICD-10-CM | POA: Diagnosis not present

## 2016-02-27 DIAGNOSIS — Z170 Estrogen receptor positive status [ER+]: Secondary | ICD-10-CM | POA: Diagnosis not present

## 2016-02-27 DIAGNOSIS — C50211 Malignant neoplasm of upper-inner quadrant of right female breast: Secondary | ICD-10-CM | POA: Diagnosis not present

## 2016-02-27 NOTE — Telephone Encounter (Signed)
spoke with patient to advise of appointment change from 03/07/16 at 2:15 pm to 03/12/16 at 2:45pm

## 2016-02-28 ENCOUNTER — Encounter: Payer: Self-pay | Admitting: Radiation Oncology

## 2016-02-28 ENCOUNTER — Ambulatory Visit
Admission: RE | Admit: 2016-02-28 | Discharge: 2016-02-28 | Disposition: A | Discharge status: Home / Self Care | Payer: 59 | Source: Ambulatory Visit | Attending: Radiation Oncology | Admitting: Radiation Oncology

## 2016-02-28 ENCOUNTER — Ambulatory Visit: Payer: 59 | Admitting: Radiation Oncology

## 2016-02-28 ENCOUNTER — Encounter: Payer: Self-pay | Admitting: Oncology

## 2016-02-28 VITALS — BP 132/87 | HR 67 | Temp 98.0°F | Ht 67.0 in | Wt 327.4 lb

## 2016-02-28 DIAGNOSIS — Z79899 Other long term (current) drug therapy: Secondary | ICD-10-CM | POA: Diagnosis not present

## 2016-02-28 DIAGNOSIS — C50211 Malignant neoplasm of upper-inner quadrant of right female breast: Secondary | ICD-10-CM | POA: Insufficient documentation

## 2016-02-28 DIAGNOSIS — Z170 Estrogen receptor positive status [ER+]: Secondary | ICD-10-CM | POA: Diagnosis not present

## 2016-02-28 DIAGNOSIS — Z510 Encounter for antineoplastic radiation therapy: Secondary | ICD-10-CM | POA: Diagnosis not present

## 2016-02-28 NOTE — Progress Notes (Signed)
  Radiation Oncology         [phone] ________________________________  Name: Marie Wilson MRN: [id number]  Date: 02/28/2016  DOB: 02-10-1966  Weekly Radiation Therapy Management    ICD-9-CM ICD-10-CM   1. Malignant neoplasm of upper-inner quadrant of right female breast, unspecified estrogen receptor status ([name]) 174.2 C50.211     Current Dose: 46.8 Gy     Planned Dose:  60.4 Gy  Narrative . . . . . . . . The patient presents for routine under treatment assessment.     Marie Wilson has completed 26 fractions to her right breast. She reports pain in her right breast as an 8/10. She is taking Percent at night which is not really helping. She reports having fatigue and is concerned about working due to the stress at work and the pain she is having. She is using silvadene under her breast and aveda lotion. The nurse advised the patient to stop using aveda lotion and to use biafine. The nurse notes the skin on her right breast is red with moist desquamation under the breast.                                  Set-up films were reviewed.                                 The chart was checked. Physical Findings. . .  height is 5\' 7"  (1.702 m) and weight is 327 lb 6.4 oz (148.5 kg) (abnormal). Her oral temperature is 98 F (36.7 C). Her blood pressure is 132/87 and her pulse is 67. Her oxygen saturation is 98%.  Lungs clear to auscultation bilaterally. Heart regular in rhythm and rate. Erythema throughout the right breast. Moist desquamation in the inframammary fold. Impression . . . . . . . The patient is tolerating radiation, but is having a lot of pain at this time. We will take her out of work due to pain issues and not being able to sleep at night. Plan . . . . . . . . . . . . Continue treatment as planned.I signed a note to excuse the patient from work from 02/29/16 - 03/16/16. ________________________________   Blair Promise, PhD, MD  This document serves as a record of services  personally performed by Gery Pray, MD. It was created on his behalf by Darcus Austin, a trained medical scribe. The creation of this record is based on the scribe's personal observations and the provider's statements to them. This document has been checked and approved by the attending provider.

## 2016-02-28 NOTE — Progress Notes (Signed)
Reneisha Knibbs has completed 26 fractions to her right breast. She reports having pain in her right breast at an 8/10.  She is taking percent at night which is not really helping.  She reports having fatigue and is concerned about working due to the stress at work plus the pain she is having.  She is using silvadene under her breast plus aveda lotion.  Advised her to stop using aveda lotion and to use biafine.  The skin on her right breast is red with moist desquamation under her breast.  BP 132/87 (BP Location: Left Arm, Patient Position: Sitting)   Pulse 67   Temp 98 F (36.7 C) (Oral)   Ht 5\' 7"  (1.702 m)   Wt (!) 327 lb 6.4 oz (148.5 kg)   LMP  (LMP Unknown)   SpO2 98%   BMI 51.28 kg/m    Wt Readings from Last 3 Encounters:  02/28/16 (!) 327 lb 6.4 oz (148.5 kg)  02/21/16 (!) 320 lb 6.4 oz (145.3 kg)  02/14/16 (!) 327 lb 9.6 oz (148.6 kg)

## 2016-02-29 ENCOUNTER — Ambulatory Visit
Admission: RE | Admit: 2016-02-29 | Discharge: 2016-02-29 | Disposition: A | Discharge status: Home / Self Care | Payer: 59 | Source: Ambulatory Visit | Attending: Radiation Oncology | Admitting: Radiation Oncology

## 2016-02-29 ENCOUNTER — Encounter: Payer: Self-pay | Admitting: Radiation Oncology

## 2016-02-29 DIAGNOSIS — Z510 Encounter for antineoplastic radiation therapy: Secondary | ICD-10-CM | POA: Diagnosis not present

## 2016-02-29 DIAGNOSIS — C50211 Malignant neoplasm of upper-inner quadrant of right female breast: Secondary | ICD-10-CM | POA: Diagnosis not present

## 2016-02-29 DIAGNOSIS — Z170 Estrogen receptor positive status [ER+]: Secondary | ICD-10-CM | POA: Diagnosis not present

## 2016-02-29 DIAGNOSIS — Z79899 Other long term (current) drug therapy: Secondary | ICD-10-CM | POA: Diagnosis not present

## 2016-02-29 NOTE — Progress Notes (Signed)
Paperwork (matrix) received 1/9, given to nurse 1/10

## 2016-03-01 ENCOUNTER — Ambulatory Visit
Admission: RE | Admit: 2016-03-01 | Discharge: 2016-03-01 | Disposition: A | Discharge status: Home / Self Care | Payer: 59 | Source: Ambulatory Visit | Attending: Radiation Oncology | Admitting: Radiation Oncology

## 2016-03-01 ENCOUNTER — Ambulatory Visit: Payer: 59

## 2016-03-01 DIAGNOSIS — Z170 Estrogen receptor positive status [ER+]: Secondary | ICD-10-CM | POA: Diagnosis not present

## 2016-03-01 DIAGNOSIS — Z510 Encounter for antineoplastic radiation therapy: Secondary | ICD-10-CM | POA: Diagnosis not present

## 2016-03-01 DIAGNOSIS — Z79899 Other long term (current) drug therapy: Secondary | ICD-10-CM | POA: Diagnosis not present

## 2016-03-01 DIAGNOSIS — C50211 Malignant neoplasm of upper-inner quadrant of right female breast: Secondary | ICD-10-CM | POA: Diagnosis not present

## 2016-03-02 ENCOUNTER — Ambulatory Visit: Payer: 59

## 2016-03-02 ENCOUNTER — Encounter: Payer: Self-pay | Admitting: Radiation Oncology

## 2016-03-02 ENCOUNTER — Ambulatory Visit
Admission: RE | Admit: 2016-03-02 | Discharge: 2016-03-02 | Disposition: A | Discharge status: Home / Self Care | Payer: 59 | Source: Ambulatory Visit | Attending: Radiation Oncology | Admitting: Radiation Oncology

## 2016-03-02 DIAGNOSIS — C50211 Malignant neoplasm of upper-inner quadrant of right female breast: Secondary | ICD-10-CM | POA: Diagnosis not present

## 2016-03-02 DIAGNOSIS — Z510 Encounter for antineoplastic radiation therapy: Secondary | ICD-10-CM | POA: Diagnosis not present

## 2016-03-02 DIAGNOSIS — Z170 Estrogen receptor positive status [ER+]: Secondary | ICD-10-CM | POA: Diagnosis not present

## 2016-03-02 DIAGNOSIS — Z79899 Other long term (current) drug therapy: Secondary | ICD-10-CM | POA: Diagnosis not present

## 2016-03-02 MED FILL — ALPRAZolam 0.5 MG TABS: 0.5 | 30 days supply | Qty: 60 | Fill #0

## 2016-03-02 MED FILL — PROPRANOLOL ER 60 MG CAP: 60 | 30 days supply | Qty: 30 | Fill #0

## 2016-03-02 MED FILL — VENLAFAXINE HCL ER 75 MG CA: 75 | 30 days supply | Qty: 60 | Fill #0

## 2016-03-02 NOTE — Progress Notes (Signed)
Paperwork (matrix) received from doctor 1/11, faxed to [phone], confirmation received. Copy given to patient

## 2016-03-05 ENCOUNTER — Ambulatory Visit
Admission: RE | Admit: 2016-03-05 | Discharge: 2016-03-05 | Disposition: A | Discharge status: Home / Self Care | Payer: 59 | Source: Ambulatory Visit | Attending: Radiation Oncology | Admitting: Radiation Oncology

## 2016-03-05 DIAGNOSIS — Z79899 Other long term (current) drug therapy: Secondary | ICD-10-CM | POA: Diagnosis not present

## 2016-03-05 DIAGNOSIS — C50211 Malignant neoplasm of upper-inner quadrant of right female breast: Secondary | ICD-10-CM | POA: Diagnosis not present

## 2016-03-05 DIAGNOSIS — Z170 Estrogen receptor positive status [ER+]: Secondary | ICD-10-CM | POA: Diagnosis not present

## 2016-03-05 DIAGNOSIS — Z510 Encounter for antineoplastic radiation therapy: Secondary | ICD-10-CM | POA: Diagnosis not present

## 2016-03-06 ENCOUNTER — Encounter: Payer: Self-pay | Admitting: Radiation Oncology

## 2016-03-06 ENCOUNTER — Ambulatory Visit
Admission: RE | Admit: 2016-03-06 | Discharge: 2016-03-06 | Disposition: A | Discharge status: Home / Self Care | Payer: 59 | Source: Ambulatory Visit | Attending: Radiation Oncology | Admitting: Radiation Oncology

## 2016-03-06 VITALS — BP 122/78 | HR 83 | Temp 98.2°F | Ht 67.0 in | Wt 328.8 lb

## 2016-03-06 DIAGNOSIS — F411 Generalized anxiety disorder: Secondary | ICD-10-CM | POA: Diagnosis not present

## 2016-03-06 DIAGNOSIS — Z510 Encounter for antineoplastic radiation therapy: Secondary | ICD-10-CM | POA: Diagnosis not present

## 2016-03-06 DIAGNOSIS — Z79899 Other long term (current) drug therapy: Secondary | ICD-10-CM | POA: Diagnosis not present

## 2016-03-06 DIAGNOSIS — Z170 Estrogen receptor positive status [ER+]: Secondary | ICD-10-CM | POA: Diagnosis not present

## 2016-03-06 DIAGNOSIS — F330 Major depressive disorder, recurrent, mild: Secondary | ICD-10-CM | POA: Diagnosis not present

## 2016-03-06 DIAGNOSIS — C50211 Malignant neoplasm of upper-inner quadrant of right female breast: Secondary | ICD-10-CM | POA: Insufficient documentation

## 2016-03-06 NOTE — Progress Notes (Signed)
Marie Wilson has completed 32 fractions to her right breast.  She reports she is having pain at a 4/10 in her right breast.  She has been taking percocet only at night.  She reports having fatigue.  She is using radiaplex, silvadene, neosporin and hydrogel pads.  The skin on her right breast is red with smaller areas of desquamation under her breast.  She has been provided with more hydrogel pads and samples of triple antibiotic ointment.  BP 122/78 (BP Location: Left Arm, Patient Position: Sitting)   Pulse 83   Temp 98.2 F (36.8 C) (Oral)   Ht 5\' 7"  (1.702 m)   Wt (!) 328 lb 12.8 oz (149.1 kg)   LMP  (LMP Unknown)   SpO2 100%   BMI 51.50 kg/m    Wt Readings from Last 3 Encounters:  03/06/16 (!) 328 lb 12.8 oz (149.1 kg)  02/28/16 (!) 327 lb 6.4 oz (148.5 kg)  02/21/16 (!) 320 lb 6.4 oz (145.3 kg)

## 2016-03-06 NOTE — Progress Notes (Signed)
  Radiation Oncology         [phone] ________________________________  Name: Marie Wilson MRN: [id number]  Date: 03/06/2016  DOB: [birth year]   Weekly Radiation Therapy Management    ICD-9-CM ICD-10-CM   1. Malignant neoplasm of upper-inner quadrant of right female breast, unspecified estrogen receptor status ([name]) 174.2 C50.211     Current Dose: 58.4 Gy     Planned Dose:  60.4 Gy  Narrative . . . . . . . . The patient presents for routine under treatment assessment.     Marie Wilson has completed 32 fractions to her right breast. She reports pain as a 4/10 in her right breast. She has been taking percocet only at night. She reports having fatigue. She is using radiaplex, silvadene, neosporin, and hydrogel pads. The nurse notes the skin on her right breast is red with smaller areas of desquamation under her breast. She has been provided with more hydrogel pads and samples of triple antibiotic ointment.                                  Set-up films were reviewed.                                 The chart was checked. Physical Findings. . .  height is 5\' 7"  (1.702 m) and weight is 328 lb 12.8 oz (149.1 kg) (abnormal). Her oral temperature is 98.2 F (36.8 C). Her blood pressure is 122/78 and her pulse is 83. Her oxygen saturation is 100%.  Lungs clear to auscultation bilaterally. Heart regular in rhythm and rate. Moist desquamation in the inframammary fold of the right breast. Erythema throughout the remainder of the breast. Impression . . . . . . . The patient is tolerating radiation, but is continuing to have pain at this time. Plan . . . . . . . . . . . . Continue treatment as planned. Follow-up in one week in light of moist desquamation. ________________________________   Blair Promise, PhD, MD  This document serves as a record of services personally performed by Gery Pray, MD. It was created on his behalf by Darcus Austin, a trained medical scribe. The creation of this  record is based on the scribe's personal observations and the provider's statements to them. This document has been checked and approved by the attending provider.

## 2016-03-07 ENCOUNTER — Encounter: Payer: Self-pay | Admitting: Radiation Oncology

## 2016-03-07 ENCOUNTER — Ambulatory Visit
Admission: RE | Admit: 2016-03-07 | Discharge: 2016-03-07 | Disposition: A | Discharge status: Home / Self Care | Payer: 59 | Source: Ambulatory Visit | Attending: Radiation Oncology | Admitting: Radiation Oncology

## 2016-03-07 ENCOUNTER — Ambulatory Visit: Payer: 59

## 2016-03-07 ENCOUNTER — Telehealth: Payer: Self-pay | Admitting: *Deleted

## 2016-03-07 ENCOUNTER — Ambulatory Visit: Payer: 59 | Admitting: Hematology and Oncology

## 2016-03-07 DIAGNOSIS — C50211 Malignant neoplasm of upper-inner quadrant of right female breast: Secondary | ICD-10-CM | POA: Diagnosis not present

## 2016-03-07 DIAGNOSIS — Z170 Estrogen receptor positive status [ER+]: Secondary | ICD-10-CM | POA: Diagnosis not present

## 2016-03-07 DIAGNOSIS — Z79899 Other long term (current) drug therapy: Secondary | ICD-10-CM | POA: Diagnosis not present

## 2016-03-07 DIAGNOSIS — Z510 Encounter for antineoplastic radiation therapy: Secondary | ICD-10-CM | POA: Diagnosis not present

## 2016-03-07 NOTE — Telephone Encounter (Signed)
CALLED PATIENT TO INFORM OF ONE WEEK FU WITH DR. Lawn ON 03-15-16 @ 9:50 AM, SPOKE WITH PATIENT AND SHE IS AWARE OF THIS APPT.

## 2016-03-08 ENCOUNTER — Ambulatory Visit: Admission: RE | Admit: 2016-03-08 | Payer: 59 | Source: Ambulatory Visit

## 2016-03-08 ENCOUNTER — Telehealth: Payer: Self-pay | Admitting: *Deleted

## 2016-03-08 NOTE — Telephone Encounter (Signed)
  Oncology Nurse Navigator Documentation  Navigator Location: [facility] (03/08/16 1500)   )Navigator Encounter Type: Telephone (03/08/16 1500) Telephone: Outgoing Call (03/08/16 1500)                   Patient Visit Type: RadOnc (03/08/16 1500) Treatment Phase: Final Radiation Tx (03/08/16 1500)                            Time Spent with Patient: 15 (03/08/16 1500)

## 2016-03-12 ENCOUNTER — Ambulatory Visit (HOSPITAL_BASED_OUTPATIENT_CLINIC_OR_DEPARTMENT_OTHER): Payer: 59 | Admitting: Hematology and Oncology

## 2016-03-12 ENCOUNTER — Encounter: Payer: Self-pay | Admitting: Hematology and Oncology

## 2016-03-12 DIAGNOSIS — Z170 Estrogen receptor positive status [ER+]: Secondary | ICD-10-CM

## 2016-03-12 DIAGNOSIS — C50211 Malignant neoplasm of upper-inner quadrant of right female breast: Secondary | ICD-10-CM

## 2016-03-12 NOTE — Assessment & Plan Note (Signed)
Right lumpectomy 12/08/2015: IDC grade 1, 0.6 cm, DCIS with calcifications, intermediate grade, margins negative, 0/7 lymph nodes negative, ER 95%, PR 95%, HER-2 negative ratio 1.16, Ki-67 2%, T1 BN 0 stage IA Adjuvant radiation: 01/19/2016 to 03/07/2016  Treatment plan: Antiestrogen therapy with tamoxifen 20 mg daily Tamoxifen counseling: We discussed the risks and benefits of tamoxifen. These include but not limited to insomnia, hot flashes, mood changes, vaginal dryness, and weight gain. Although rare, serious side effects including endometrial cancer, risk of blood clots were also discussed. We strongly believe that the benefits far outweigh the risks. Patient understands these risks and consented to starting treatment. Planned treatment duration is 5 years.  Return to clinic in 3 months for toxicity evaluation on tamoxifen.

## 2016-03-12 NOTE — Progress Notes (Signed)
Patient Care Team: Thressa Sheller, MD as PCP - General (Internal Medicine) Erroll Luna, MD as Consulting Physician (General Surgery) Nicholas Lose, MD as Consulting Physician (Hematology and Oncology) Gery Pray, MD as Consulting Physician (Radiation Oncology)  DIAGNOSIS:  Encounter Diagnosis  Name Primary?  . Malignant neoplasm of upper-inner quadrant of right breast in female, estrogen receptor positive ([place])     SUMMARY OF ONCOLOGIC HISTORY:   Breast cancer of upper-inner quadrant of right female breast ([place])   10/27/2015 Initial Diagnosis    Screening detected right breast calcifications spanning 4 mm, biopsy intermediate grade DCIS with small focus of invasive ductal carcinoma ER 95%, PR 95%, HER-2 negative ratio 1.16, Ki-67 2%, T1a N0 stage IA      12/08/2015 Surgery    Right lumpectomy: IDC grade 1, 0.6 cm, DCIS with calcifications, intermediate grade, margins negative, 0/7 lymph nodes negative, ER 95%, PR 95%, HER-2 negative ratio 1.16, Ki-67 2%, T1 BN 0 stage IA      01/19/2016 - 03/07/2016 Radiation Therapy    Adjuvant radiation       CHIEF COMPLIANT: Follow-up after radiation  INTERVAL HISTORY: Marie Wilson is a 51 year old with above-mentioned history right breast cancer treated with lumpectomy and is completed adjuvant radiation therapy. She has profound radiation dermatitis and is very uncomfortable from it. She is here to talk about the next steps in the treatment. She denies any fatigue.  REVIEW OF SYSTEMS:   Constitutional: Denies fevers, chills or abnormal weight loss Eyes: Denies blurriness of vision Ears, nose, mouth, throat, and face: Denies mucositis or sore throat Respiratory: Denies cough, dyspnea or wheezes Cardiovascular: Denies palpitation, chest discomfort Gastrointestinal:  Denies nausea, heartburn or change in bowel habits Skin: Denies abnormal skin rashes Lymphatics: Denies new lymphadenopathy or easy bruising Neurological:Denies  numbness, tingling or new weaknesses Behavioral/Psych: Mood is stable, no new changes  Extremities: No lower extremity edema Breast: Severe radiation dermatitis All other systems were reviewed with the patient and are negative.  I have reviewed the past medical history, past surgical history, social history and family history with the patient and they are unchanged from previous note.  ALLERGIES:  is allergic to no known allergies.  MEDICATIONS:  Current Outpatient Prescriptions  Medication Sig Dispense Refill  . ALPRAZolam (XANAX) 0.5 MG tablet Take 0.5 mg by mouth daily as needed for anxiety.    . hyaluronate sodium (RADIAPLEXRX) GEL Apply 1 application topically 2 (two) times daily.    Marland Kitchen ibuprofen (ADVIL,MOTRIN) 200 MG tablet Take 400-800 mg by mouth every 6 (six) hours as needed for cramping.    Marland Kitchen levothyroxine (SYNTHROID, LEVOTHROID) 88 MCG tablet Take 88 mcg by mouth daily before breakfast.    . loratadine (CLARITIN) 10 MG tablet Take 10 mg by mouth daily.    . Melatonin 3 MG TABS Take 2 tablets by mouth at bedtime as needed (sleep).     . Multiple Vitamins-Minerals (MULTIVITAMIN PO) Take 1 tablet by mouth daily.    . non-metallic deodorant Jethro Poling) MISC Apply 1 application topically daily as needed.    Marland Kitchen oxyCODONE-acetaminophen (PERCOCET/ROXICET) 5-325 MG tablet Take 1-2 tablets by mouth every 6 (six) hours as needed for severe pain. 30 tablet 0  . propranolol ER (INDERAL LA) 60 MG 24 hr capsule Take 60 mg by mouth daily.    . silver sulfADIAZINE (SILVADENE) 1 % cream Apply 1 application topically daily.    . tamoxifen (NOLVADEX) 20 MG tablet Take 1 tablet (20 mg total) by mouth daily. (Patient  not taking: Reported on 03/06/2016) 90 tablet 3  . venlafaxine (EFFEXOR) 75 MG tablet Take 75 mg by mouth at bedtime.     No current facility-administered medications for this visit.     PHYSICAL EXAMINATION: ECOG PERFORMANCE STATUS: 1 - Symptomatic but completely ambulatory  Vitals:    03/12/16 1500  BP: 120/75  Pulse: 77  Resp: 20  Temp: 97.5 F (36.4 C)   Filed Weights   03/12/16 1500  Weight: (!) 327 lb 12.8 oz (148.7 kg)    GENERAL:alert, no distress and comfortable SKIN: skin color, texture, turgor are normal, no rashes or significant lesions EYES: normal, Conjunctiva are pink and non-injected, sclera clear OROPHARYNX:no exudate, no erythema and lips, buccal mucosa, and tongue normal  NECK: supple, thyroid normal size, non-tender, without nodularity LYMPH:  no palpable lymphadenopathy in the cervical, axillary or inguinal LUNGS: clear to auscultation and percussion with normal breathing effort HEART: regular rate & rhythm and no murmurs and no lower extremity edema ABDOMEN:abdomen soft, non-tender and normal bowel sounds MUSCULOSKELETAL:no cyanosis of digits and no clubbing  NEURO: alert & oriented x 3 with fluent speech, no focal motor/sensory deficits EXTREMITIES: No lower extremity edema  LABORATORY DATA:  I have reviewed the data as listed   Chemistry      Component Value Date/Time   NA 135 12/08/2015 0912   NA 139 11/02/2015 0840   K 4.0 12/08/2015 0912   K 4.0 11/02/2015 0840   CL 102 12/08/2015 0912   CO2 25 12/08/2015 0912   CO2 23 11/02/2015 0840   BUN 9 12/08/2015 0912   BUN 11.6 11/02/2015 0840   CREATININE 0.81 12/08/2015 0912   CREATININE 0.8 11/02/2015 0840      Component Value Date/Time   CALCIUM 9.1 12/08/2015 0912   CALCIUM 9.1 11/02/2015 0840   ALKPHOS 90 11/02/2015 0840   AST 21 11/02/2015 0840   ALT 26 11/02/2015 0840   BILITOT 0.44 11/02/2015 0840       Lab Results  Component Value Date   WBC 8.1 12/08/2015   HGB 13.9 12/08/2015   HCT 42.7 12/08/2015   MCV 86.8 12/08/2015   PLT 277 12/08/2015   NEUTROABS 4.6 11/02/2015    ASSESSMENT & PLAN:  Breast cancer of upper-inner quadrant of right female breast ([place]) Right lumpectomy 12/08/2015: IDC grade 1, 0.6 cm, DCIS with calcifications, intermediate grade,  margins negative, 0/7 lymph nodes negative, ER 95%, PR 95%, HER-2 negative ratio 1.16, Ki-67 2%, T1 BN 0 stage IA Adjuvant radiation: 01/19/2016 to 03/07/2016  Treatment plan: Antiestrogen therapy with tamoxifen 20 mg daily Tamoxifen counseling: We discussed the risks and benefits of tamoxifen. These include but not limited to insomnia, hot flashes, mood changes, vaginal dryness, and weight gain. Although rare, serious side effects including endometrial cancer, risk of blood clots were also discussed. We strongly believe that the benefits far outweigh the risks. Patient understands these risks and consented to starting treatment. Planned treatment duration is 5 years.  She will start tamoxifen 04/12/2016 Return to clinic in 4 months for toxicity evaluation on tamoxifen.  I spent 25 minutes talking to the patient of which more than half was spent in counseling and coordination of care.  No orders of the defined types were placed in this encounter.  The patient has a good understanding of the overall plan. she agrees with it. she will call with any problems that may develop before the next visit here.   Rulon Eisenmenger, MD 03/12/16

## 2016-03-13 NOTE — Progress Notes (Signed)
  Radiation Oncology         (336) [phone] ________________________________  Name: Marie Wilson MRN: [id number]  Date: 03/07/2016  DOB: Feb 22, 1965  End of Treatment Note  Diagnosis: Stage IA (T1b, pN0), grade 1 invasive ductal carcinoma of the right breast (ER/PR +, HER2 -)  Indication for treatment:  Curative, post-op to reduce the risk of recurrence  Radiation treatment dates:   01/19/16 - 03/07/16  Site/dose:    1) Right breast: 50.4 Gy in 28 fractions. 2) Right breast boost: 10 Gy in 5 fractions.  Beams/energy:    1) 3D // 15X, 6X Photon 2) Isodose Plan // 10X, 6X Photon  Narrative: The patient tolerated radiation treatment relatively well. During treatment, the patient developed moist desquamation in the inframammary fold of the right breast with erythema throughout the remainder of the breast. The patient used radiaplex, silvadene, neosporin, and hydrogel pads. The patient had pain in the right breast, which would keep her up at night, and she was prescribed Percocet. She reported fatigue and was taken out of work due to stress and pain.  Plan: The patient has completed radiation treatment. The patient will return to radiation oncology clinic for routine followup in [place]. I advised them to call or return sooner if they have any questions or concerns related to their recovery or treatment.  -----------------------------------  Blair Promise, PhD, MD  This document serves as a record of services personally performed by Gery Pray, MD. It was created on his behalf by Darcus Austin, a trained medical scribe. The creation of this record is based on the scribe's personal observations and the provider's statements to them. This document has been checked and approved by the attending provider.

## 2016-03-15 ENCOUNTER — Ambulatory Visit
Admission: RE | Admit: 2016-03-15 | Discharge: 2016-03-15 | Disposition: A | Discharge status: Home / Self Care | Payer: 59 | Source: Ambulatory Visit | Attending: Radiation Oncology | Admitting: Radiation Oncology

## 2016-03-15 ENCOUNTER — Encounter: Payer: Self-pay | Admitting: Oncology

## 2016-03-15 ENCOUNTER — Encounter: Payer: Self-pay | Admitting: Radiation Oncology

## 2016-03-15 DIAGNOSIS — R21 Rash and other nonspecific skin eruption: Secondary | ICD-10-CM | POA: Diagnosis not present

## 2016-03-15 DIAGNOSIS — Z79899 Other long term (current) drug therapy: Secondary | ICD-10-CM | POA: Diagnosis not present

## 2016-03-15 DIAGNOSIS — Y842 Radiological procedure and radiotherapy as the cause of abnormal reaction of the patient, or of later complication, without mention of misadventure at the time of the procedure: Secondary | ICD-10-CM | POA: Diagnosis not present

## 2016-03-15 DIAGNOSIS — C50211 Malignant neoplasm of upper-inner quadrant of right female breast: Secondary | ICD-10-CM | POA: Diagnosis not present

## 2016-03-15 DIAGNOSIS — Z170 Estrogen receptor positive status [ER+]: Secondary | ICD-10-CM | POA: Insufficient documentation

## 2016-03-15 NOTE — Progress Notes (Signed)
Radiation Oncology         [phone] ________________________________  Name: Marie Wilson MRN: [id number]  Date: 03/15/2016  DOB: [birth year]  Follow-Up Visit Note  CC: Thressa Sheller, MD  Nicholas Lose, MD    ICD-9-CM ICD-10-CM   1. Malignant neoplasm of upper-inner quadrant of right breast in female, estrogen receptor positive ([place]) 174.2 C50.211    V86.0 Z17.0     Diagnosis:   Stage IA (T1b, pN0), grade 1 invasive ductal carcinoma of the right breast (ER/PR +, HER2 -)  Interval Since Last Radiation:  8 days  01/19/16 - 03/07/16: 1) Right breast: 50.4 Gy in 28 fractions. 2) Right breast boost: 10 Gy in 5 fractions.  Narrative:  The patient returns today for routine follow-up. The patient saw Dr. Lindi Adie on 03/12/16. He discussed Tamoxifen and she will start it on 04/12/2016. She denies pain, other than frequent shooting pains in her right breast. She reports having some pruritis underneath her right breast. The nurse notes skin of her right breast is red with a rash underneath her breast. The patient is using biafine cream. She reports her energy level is improving.  ALLERGIES:  is allergic to no known allergies.  Meds: Current Outpatient Prescriptions  Medication Sig Dispense Refill  . ALPRAZolam (XANAX) 0.5 MG tablet Take 0.5 mg by mouth daily as needed for anxiety.    Marland Kitchen ibuprofen (ADVIL,MOTRIN) 200 MG tablet Take 400-800 mg by mouth every 6 (six) hours as needed for cramping.    Marland Kitchen levothyroxine (SYNTHROID, LEVOTHROID) 88 MCG tablet Take 88 mcg by mouth daily before breakfast.    . loratadine (CLARITIN) 10 MG tablet Take 10 mg by mouth daily.    . Melatonin 3 MG TABS Take 2 tablets by mouth at bedtime as needed (sleep).     . Multiple Vitamins-Minerals (MULTIVITAMIN PO) Take 1 tablet by mouth daily.    . non-metallic deodorant Jethro Poling) MISC Apply 1 application topically daily as needed.    . propranolol ER (INDERAL LA) 60 MG 24 hr capsule Take 60 mg by mouth daily.    Marland Kitchen  venlafaxine (EFFEXOR) 75 MG tablet Take 75 mg by mouth at bedtime.    . hyaluronate sodium (RADIAPLEXRX) GEL Apply 1 application topically 2 (two) times daily.    Marland Kitchen oxyCODONE-acetaminophen (PERCOCET/ROXICET) 5-325 MG tablet Take 1-2 tablets by mouth every 6 (six) hours as needed for severe pain. (Patient not taking: Reported on 03/15/2016) 30 tablet 0  . silver sulfADIAZINE (SILVADENE) 1 % cream Apply 1 application topically daily.    . tamoxifen (NOLVADEX) 20 MG tablet Take 1 tablet (20 mg total) by mouth daily. (Patient not taking: Reported on 03/06/2016) 90 tablet 3   No current facility-administered medications for this encounter.     Physical Findings: The patient is in no acute distress. Patient is alert and oriented.  height is _0  (1.702 m) and weight is 327 lb 3.2 oz (148.4 kg) (abnormal). Her oral temperature is 97.8 F (36.6 C). Her blood pressure is 116/81 and her pulse is 75. Her oxygen saturation is 100%.   Lungs are clear to auscultation bilaterally. Heart has regular rate and rhythm. No palpable cervical, supraclavicular, or axillary adenopathy. Right breast shows residual erythema and dry desquamation, but no moist desquamation at this time. Macular papillar rash extending outside of the radiation field.  Lab Findings: Lab Results  Component Value Date   WBC 8.1 12/08/2015   HGB 13.9 12/08/2015   HCT 42.7 12/08/2015   MCV 86.8  12/08/2015   PLT 277 12/08/2015    Radiographic Findings: No results found.  Impression:  The patient is recovering from the effects of radiation. No sign of recurrence on clinical exam. There is a macular papillar rash extending outside of the radiation field that could be radiation dermatitis or a fungal infection.  Plan: We provided an antifungal power for the patient to apply to the rash. If this does not help, then she is to use hydrocortisone cream. The patient will follow up with radiation oncology in 1  month. ____________________________________ -----------------------------------  Blair Promise, PhD, MD  This document serves as a record of services personally performed by Gery Pray, MD. It was created on his behalf by Darcus Austin, a trained medical scribe. The creation of this record is based on the scribe's personal observations and the provider's statements to them. This document has been checked and approved by the attending provider.

## 2016-03-15 NOTE — Progress Notes (Signed)
Marie Wilson is here for follow up after treatment to her right breast.  She denies having pain other than frequent shooting pains in her right breast.  She reports that her energy level is improving.  She will start tamoxifen in February.  She reports having some itching under her right breast.  The skin on her right breast is red with a rash underneath her breast.  She is using biafine cream.  BP 116/81 (BP Location: Left Arm, Patient Position: Sitting)   Pulse 75   Temp 97.8 F (36.6 C) (Oral)   Ht 5\' 7"  (1.702 m)   Wt (!) 327 lb 3.2 oz (148.4 kg)   LMP  (LMP Unknown) Comment: DUB  SpO2 100%   BMI 51.25 kg/m    Wt Readings from Last 3 Encounters:  03/15/16 (!) 327 lb 3.2 oz (148.4 kg)  03/12/16 (!) 327 lb 12.8 oz (148.7 kg)  03/06/16 (!) 328 lb 12.8 oz (149.1 kg)

## 2016-04-03 ENCOUNTER — Encounter (HOSPITAL_COMMUNITY): Payer: Self-pay

## 2016-04-11 ENCOUNTER — Ambulatory Visit: Payer: 59 | Admitting: Hematology and Oncology

## 2016-04-13 MED FILL — VENLAFAXINE HCL ER 150 MG C: 150 | 90 days supply | Qty: 90 | Fill #0

## 2016-04-26 ENCOUNTER — Ambulatory Visit: Payer: 59 | Admitting: Radiation Oncology

## 2016-04-27 MED FILL — PROPRANOLOL ER 60 MG CAP: 60 | 90 days supply | Qty: 90 | Fill #0

## 2016-04-30 MED FILL — SYNTHROID 100 MCG TABLET: 100 | 90 days supply | Qty: 90 | Fill #0

## 2016-05-02 ENCOUNTER — Encounter: Payer: Self-pay | Admitting: Oncology

## 2016-05-07 ENCOUNTER — Encounter: Payer: Self-pay | Admitting: Radiation Oncology

## 2016-05-07 ENCOUNTER — Ambulatory Visit
Admission: RE | Admit: 2016-05-07 | Discharge: 2016-05-07 | Disposition: A | Discharge status: Home / Self Care | Payer: 59 | Source: Ambulatory Visit | Attending: Radiation Oncology | Admitting: Radiation Oncology

## 2016-05-07 DIAGNOSIS — Y842 Radiological procedure and radiotherapy as the cause of abnormal reaction of the patient, or of later complication, without mention of misadventure at the time of the procedure: Secondary | ICD-10-CM | POA: Insufficient documentation

## 2016-05-07 DIAGNOSIS — Z79899 Other long term (current) drug therapy: Secondary | ICD-10-CM | POA: Insufficient documentation

## 2016-05-07 DIAGNOSIS — Z170 Estrogen receptor positive status [ER+]: Secondary | ICD-10-CM | POA: Diagnosis not present

## 2016-05-07 DIAGNOSIS — C50211 Malignant neoplasm of upper-inner quadrant of right female breast: Secondary | ICD-10-CM | POA: Insufficient documentation

## 2016-05-07 NOTE — Progress Notes (Signed)
Radiation Oncology         [phone] ________________________________  Name: Marie Wilson MRN: [id number]  Date: 05/07/2016  DOB: [birth year]  Follow-Up Visit Note  CC: Thressa Sheller, MD  Nicholas Lose, MD    ICD-9-CM ICD-10-CM   1. Malignant neoplasm of upper-inner quadrant of right breast in female, estrogen receptor positive ([place]) 174.2 C50.211    V86.0 Z17.0     Diagnosis:   Stage IA (T1b, pN0), grade 1 invasive ductal carcinoma of the right breast (ER/PR +, HER2 -)  Interval Since Last Radiation:  2 months  01/19/16 - 03/07/16: 1) Right breast: 50.4 Gy in 28 fractions. 2) Right breast boost: 10 Gy in 5 fractions.  Narrative:  The patient returns today for routine follow-up. The patient saw Dr. Lindi Adie on 04/01/16. She was started on Tamoxifen on 04/15/16. She denies pain, lymphedema, and reports a good appetite. Per nursing, the right breast is erythematous. The patient reports using regular lotion on dry skin.  ALLERGIES:  is allergic to no known allergies.  Meds: Current Outpatient Prescriptions  Medication Sig Dispense Refill  . ALPRAZolam (XANAX) 0.5 MG tablet Take 0.5 mg by mouth daily as needed for anxiety.    Marland Kitchen ibuprofen (ADVIL,MOTRIN) 200 MG tablet Take 400-800 mg by mouth every 6 (six) hours as needed for cramping.    Marland Kitchen levothyroxine (SYNTHROID, LEVOTHROID) 88 MCG tablet Take 88 mcg by mouth daily before breakfast.    . loratadine (CLARITIN) 10 MG tablet Take 10 mg by mouth daily.    . propranolol ER (INDERAL LA) 60 MG 24 hr capsule Take 60 mg by mouth daily.    . tamoxifen (NOLVADEX) 20 MG tablet Take 1 tablet (20 mg total) by mouth daily. 90 tablet 3  . venlafaxine (EFFEXOR) 75 MG tablet Take 75 mg by mouth at bedtime.    . Melatonin 3 MG TABS Take 2 tablets by mouth at bedtime as needed (sleep).     . Multiple Vitamins-Minerals (MULTIVITAMIN PO) Take 1 tablet by mouth daily.    Marland Kitchen oxyCODONE-acetaminophen (PERCOCET/ROXICET) 5-325 MG tablet Take 1-2 tablets  by mouth every 6 (six) hours as needed for severe pain. (Patient not taking: Reported on 03/15/2016) 30 tablet 0   No current facility-administered medications for this encounter.     Physical Findings: The patient is in no acute distress. Patient is alert and oriented.  height is '5\' 7"'$  (1.702 m) and weight is 324 lb (147 kg) (abnormal). Her oral temperature is 98.3 F (36.8 C). Her blood pressure is 114/67 and her pulse is 82. Her respiration is 158 (abnormal) and oxygen saturation is 98%.   Lungs are clear to auscultation bilaterally. Heart has regular rate and rhythm. No palpable cervical, supraclavicular, or axillary adenopathy.  Right breast shows hyperpigmentation changes from the radiation. Edema continues to be noted in the nipple areolar complex. Induration along lumpectomy scar consistent with surgery. No dominant mass, nipple discharge, or bleeding.  Left breast is large and pendulous. There are no palpable masses, nipple discharge, or bleeding.  Lab Findings: Lab Results  Component Value Date   WBC 8.1 12/08/2015   HGB 13.9 12/08/2015   HCT 42.7 12/08/2015   MCV 86.8 12/08/2015   PLT 277 12/08/2015    Radiographic Findings: No results found.  Impression:  The patient is recovering from the effects of radiation. No sign of recurrence on clinical exam.   Plan: The patient will follow up with radiation oncology in November after the patient's mammogram. She will  continue on Tamoxifen. ____________________________________ -----------------------------------  Blair Promise, PhD, MD  This document serves as a record of services personally performed by Gery Pray, MD. It was created on his behalf by Bethann Humble, a trained medical scribe. The creation of this record is based on the scribe's personal observations and the provider's statements to them. This document has been checked and approved by the attending provider.

## 2016-05-07 NOTE — Progress Notes (Signed)
[place] right breast cancer completed radiation 03-07-16 one month follow up. .  Skin status:Right  breast with darker pink than the left breast. What lotion are you using? Using regular lotion for dry skin. Have you seen med onc? If not, when is appointment: 04-01-16 Dr. Lindi Adie If they are ER+, have they started Al or Tamoxifen? If not, why? 04-15-16 Tamoxifen Discuss survivorship appointment. 05-24-16  Thedore Mins Have you had a mammogram scheduled? Not scheduled yet Offer referral to [facility]. Will receive at the survivorship appointment.06-20-16 Oncotype Dx. Score: Appetite:Good Pain:None Fatigue:Yes having mild fatigue. Arm mobility:Able to raise her right arm without difficulty. Lymphedema:None, stated her right arm still feels numb. Wt Readings from Last 3 Encounters:  05/07/16 (!) 324 lb (147 kg)  03/15/16 (!) 327 lb 3.2 oz (148.4 kg)  03/12/16 (!) 327 lb 12.8 oz (148.7 kg)  BP 114/67   Pulse 82   Temp 98.3 F (36.8 C) (Oral)   Resp (!) 158   Ht 5\' 7"  (1.702 m)   Wt (!) 324 lb (147 kg)   LMP  (LMP Unknown) Comment: DUB  SpO2 98%   BMI 50.75 kg/m

## 2016-05-24 ENCOUNTER — Encounter: Payer: 59 | Admitting: Adult Health

## 2016-05-24 NOTE — Progress Notes (Deleted)
CLINIC:  Survivorship   REASON FOR VISIT:  Routine follow-up post-treatment for a recent history of breast cancer.  BRIEF ONCOLOGIC HISTORY:    Breast cancer of upper-inner quadrant of right female breast ([place])   10/27/2015 Initial Diagnosis    Screening detected right breast calcifications spanning 4 mm, biopsy intermediate grade DCIS with small focus of invasive ductal carcinoma ER 95%, PR 95%, HER-2 negative ratio 1.16, Ki-67 2%, T1a N0 stage IA      11/09/2015 Genetic Testing    Genetic testing was normal, and did not reveal a deleterious mutation.  Genes tested:  ATM, BARD1, BRCA1, BRCA2, BRIP1, CDH1, CHEK2, EPCAM, FANCC, MLH1, MSH2, MSH6, NBN, PALB2, PMS2, PTEN, RAD51C, RAD51D, TP53, and XRCC2.        12/08/2015 Surgery    Right lumpectomy ([facility]): IDC grade 1, 0.6 cm, DCIS with calcifications, intermediate grade, margins negative, 0/7 lymph nodes negative, ER 95%, PR 95%, HER-2 negative ratio 1.16, Ki-67 2%, T1 BN 0 stage IA      01/19/2016 - 03/07/2016 Radiation Therapy    Adjuvant radiation ([facility]):1) Right breast: 50.4 Gy in 28 fractions.  2) Right breast boost: 10 Gy in 5 fractions.      03/12/2016 -  Anti-estrogen oral therapy    Tamoxifen 19m daily.  Planned treatment duration: 5 years.       INTERVAL HISTORY:  [facility] to review her survivorship care plan detailing her treatment course for breast cancer, as well as monitoring long-term side effects of that treatment, education regarding health maintenance, screening, and overall wellness and health promotion.     Overall, Ms. NKravitzreports feeling quite well since completing her radiation therapy approximately 3 months ago.  She ***    REVIEW OF SYSTEMS:  ROS   Breast: Denies any new nodularity, masses, tenderness, nipple changes, or nipple discharge.    A 14-point review of systems was completed and was negative, except as noted  above.   ONCOLOGY TREATMENT TEAM:  1. [facility]  [facility] 2. Medical Oncologist: Dr. GLindi Adie3. Radiation Oncologist: Dr. KSondra Come   PAST MEDICAL/SURGICAL HISTORY:  Past Medical History:  Diagnosis Date  . Anemia    one time  . Anxiety   . Breast cancer of upper-inner quadrant of right female breast ([place]28/[place]  . Cancer ([facility]    skin  . Diabetes mellitus without complication ([facility]    diet controlled- recent A1C=5  . Family history of breast cancer   . History of radiation therapy 01/19/16-03/07/16   right breast 50.4 Gy in 28 fractions, right breast boost 10 Gy in 5 fractions  . Hypertension    uses inderal for anxiety prevention which helps maintain BP  . Hypothyroidism   . Pneumonia    Past Surgical History:  Procedure Laterality Date  . BARTHOLIN GLAND CYST EXCISION    . BREAST LUMPECTOMY WITH RADIOACTIVE SEED AND SENTINEL LYMPH NODE BIOPSY Right 12/08/2015   Procedure: BREAST LUMPECTOMY WITH RADIOACTIVE SEED AND SENTINEL LYMPH NODE BIOPSY; WITH LYMPHATIC MAPPING;  Surgeon: TErroll Luna MD;  [facility]  Service: General;  Laterality: Right;  . CESAREAN SECTION    . CHOLECYSTECTOMY    . ROBOTIC ASSISTED TOTAL HYSTERECTOMY Bilateral 09/08/2014   Procedure: ROBOTIC ASSISTED TOTAL HYSTERECTOMY WITH BILATERAL SALPINGECTOMY WITH LYSIS OF ADHESIONS;  Surgeon: MPrincess Bruins MD;  [facility];  Service: Gynecology;  Laterality: Bilateral;  . skin growth  ALLERGIES:  Allergies  Allergen Reactions  . No Known Allergies      CURRENT MEDICATIONS:  Outpatient Encounter Prescriptions as of 05/24/2016  Medication Sig Note  . ALPRAZolam (XANAX) 0.5 MG tablet Take 0.5 mg by mouth daily as needed for anxiety.   Marland Kitchen ibuprofen (ADVIL,MOTRIN) 200 MG tablet Take 400-800 mg by mouth every 6 (six) hours as needed for cramping.   Marland Kitchen levothyroxine (SYNTHROID, LEVOTHROID) 88 MCG tablet Take 88 mcg by mouth daily before breakfast. 12/07/2015:  Takes at bedtime  . loratadine (CLARITIN) 10 MG tablet Take 10 mg by mouth daily. 12/07/2015: Takes at bedtime  . Melatonin 3 MG TABS Take 2 tablets by mouth at bedtime as needed (sleep).    . Multiple Vitamins-Minerals (MULTIVITAMIN PO) Take 1 tablet by mouth daily.   Marland Kitchen oxyCODONE-acetaminophen (PERCOCET/ROXICET) 5-325 MG tablet Take 1-2 tablets by mouth every 6 (six) hours as needed for severe pain. (Patient not taking: Reported on 03/15/2016)   . propranolol ER (INDERAL LA) 60 MG 24 hr capsule Take 60 mg by mouth daily. 12/07/2015: Takes at bedtime  . tamoxifen (NOLVADEX) 20 MG tablet Take 1 tablet (20 mg total) by mouth daily. 12/01/2015: Pt to begin tamoxifen after surgery.  Marland Kitchen venlafaxine (EFFEXOR) 75 MG tablet Take 75 mg by mouth at bedtime.    No facility-administered encounter medications on file as of 05/24/2016.      ONCOLOGIC FAMILY HISTORY:  Family History  Problem Relation Age of Onset  . Breast cancer Mother 62  . Breast cancer Sister 28  . Hodgkin's lymphoma Sister     dx in her 66s  . Lung cancer Maternal Uncle   . Lung cancer Paternal Uncle   . Breast cancer Cousin 54    maternal first cousin  . Thyroid cancer Cousin 58    maternal first cousin     GENETIC COUNSELING/TESTING: Genetic testing was normal, and did not reveal a deleterious mutation.  Genes tested:  ATM, BARD1, BRCA1, BRCA2, BRIP1, CDH1, CHEK2, EPCAM, FANCC, MLH1, MSH2, MSH6, NBN, PALB2, PMS2, PTEN, RAD51C, RAD51D, TP53, and XRCC2.    SOCIAL HISTORY:  Marie Wilson is /single/married/divorced/widowed/separated and lives alone/with her spouse/family/friend in (city), [place].  She has (#) children and they live in (city).  Marie Wilson is currently retired/disabled/working part-time/full-time as ***.  She denies any current or history of tobacco, alcohol, or illicit drug use.     PHYSICAL EXAMINATION:  Vital Signs:  There were no vitals filed for this visit. There were no vitals filed for this  visit. General: Well-nourished, well-appearing female in no acute distress.  She is unaccompanied/accompanied in clinic by her ***** today.   HEENT: Head is normocephalic.  Pupils equal and reactive to light. Conjunctivae clear without exudate.  Sclerae anicteric. Oral mucosa is pink, moist.  Oropharynx is pink without lesions or erythema.  Lymph: No cervical, supraclavicular, or infraclavicular lymphadenopathy noted on palpation.  Cardiovascular: Regular rate and rhythm.Marland Kitchen Respiratory: Clear to auscultation bilaterally. Chest expansion symmetric; breathing non-labored.  GI: Abdomen soft and round; non-tender, non-distended. Bowel sounds normoactive.  GU: Deferred.  Neuro: No focal deficits. Steady gait.  Psych: Mood and affect normal and appropriate for situation.  MSK: no focal spinal tenderness to palpation, FROM in bilateral upper extremities. Extremities: No edema. Skin: Warm and dry.  LABORATORY DATA:  None for this visit.  DIAGNOSTIC IMAGING:  None for this visit.      ASSESSMENT AND PLAN:  Ms.. Wilson is a pleasant 51 y.o. female with  Stage IA right breast invasive ductal carcinoma, ER+/PR+/HER2-, diagnosed in 10/2015, treated with lumpectomy, adjuvant radiation therapy, and anti-estrogen therapy with Tamoxifen beginning in January, 2018.  She presents to the Survivorship Clinic for our initial meeting and routine follow-up post-completion of treatment for breast cancer.    1. Stage IA right breast cancer:  Marie Wilson is continuing to recover from definitive treatment for breast cancer. She will follow-up with her medical oncologist, Dr. Lindi Adie in May, 2018 with history and physical exam per surveillance protocol.  She will continue her anti-estrogen therapy with (drug). Thus far, she is tolerating the *** well, with minimal side effects. She was instructed to make Dr. Lindi Adie or myself aware if she begins to experience any worsening side effects of the medication and I could see her  back in clinic to help manage those side effects, as needed. Though the incidence is low, there is an associated risk of endometrial cancer with anti-estrogen therapies like Tamoxifen.  Marie Wilson was encouraged to contact Dr. Lindi Adie or myself with any vaginal bleeding while taking Tamoxifen. Other side effects of Tamoxifen were again reviewed with her as well. Today, a comprehensive survivorship care plan and treatment summary was reviewed with the patient today detailing her breast cancer diagnosis, treatment course, potential late/long-term effects of treatment, appropriate follow-up care with recommendations for the future, and patient education resources.  A copy of this summary, along with a letter will be sent to the patient's primary care provider via mail/fax/In Basket message after today's visit.    #. Problem(s) at Visit______________  #. Bone health:  Given Marie Wilson's age/history of breast cancer and her current treatment regimen including anti-estrogen therapy with _______, she is at risk for bone demineralization.  Her last DEXA scan was **/**/20**, which showed (results).***  In the meantime, she was encouraged to increase her consumption of foods rich in calcium, as well as increase her weight-bearing activities.  She was given education on specific activities to promote bone health.  #. Cancer screening:  Due to Marie Wilson's history and her age, she should receive screening for skin cancers, colon cancer, and gynecologic cancers.  The information and recommendations are listed on the patient's comprehensive care plan/treatment summary and were reviewed in detail with the patient.    #. Health maintenance and wellness promotion: Marie Wilson was encouraged to consume 5-7 servings of fruits and vegetables per day. We reviewed the "Nutrition Rainbow" handout, as well as the handout "Take Control of Your Health and Reduce Your Cancer Risk" from the [employer].  She was also  encouraged to engage in moderate to vigorous exercise for 30 minutes per day most days of the week. We discussed the [employer] fitness program, which is designed for cancer survivors to help them become more physically fit after cancer treatments.  She was instructed to limit her alcohol consumption and continue to abstain from tobacco use/***was encouraged stop smoking.     #. Support services/counseling: It is not uncommon for this period of the patient's cancer care trajectory to be one of many emotions and stressors.  We discussed an opportunity for her to participate in the next session of [facility] ("Finding Your New Normal") support group series designed for patients after they have completed treatment.   Marie Wilson was encouraged to take advantage of our many other support services programs, support groups, and/or counseling in coping with her new life as a cancer survivor after completing anti-cancer treatment.  She was offered support  today through active listening and expressive supportive counseling.  She was given information regarding our available services and encouraged to contact me with any questions or for help enrolling in any of our support group/programs.    Dispo:   -Return to cancer center ***  -She is welcome to return back to the Survivorship Clinic at any time; no additional follow-up needed at this time.  -Consider referral back to survivorship as a long-term survivor for continued surveillance  A total of (#) minutes of face-to-face time was spent with this patient with greater than 50% of that time in counseling and care-coordination.   Gardenia Phlegm, NP Survivorship Program [facility] [phone]   Note: Camuy Thressa Sheller, [place] [phone]

## 2016-06-04 DIAGNOSIS — M722 Plantar fascial fibromatosis: Secondary | ICD-10-CM | POA: Diagnosis not present

## 2016-06-04 DIAGNOSIS — S93691A Other sprain of right foot, initial encounter: Secondary | ICD-10-CM | POA: Diagnosis not present

## 2016-06-04 DIAGNOSIS — M7731 Calcaneal spur, right foot: Secondary | ICD-10-CM | POA: Diagnosis not present

## 2016-06-04 DIAGNOSIS — M71571 Other bursitis, not elsewhere classified, right ankle and foot: Secondary | ICD-10-CM | POA: Diagnosis not present

## 2016-06-04 DIAGNOSIS — M71572 Other bursitis, not elsewhere classified, left ankle and foot: Secondary | ICD-10-CM | POA: Diagnosis not present

## 2016-06-11 DIAGNOSIS — S96291D Other specified injury of intrinsic muscle and tendon at ankle and foot level, right foot, subsequent encounter: Secondary | ICD-10-CM | POA: Diagnosis not present

## 2016-06-11 DIAGNOSIS — M71572 Other bursitis, not elsewhere classified, left ankle and foot: Secondary | ICD-10-CM | POA: Diagnosis not present

## 2016-06-11 DIAGNOSIS — M722 Plantar fascial fibromatosis: Secondary | ICD-10-CM | POA: Diagnosis not present

## 2016-07-10 ENCOUNTER — Ambulatory Visit: Payer: 59 | Admitting: Hematology and Oncology

## 2016-07-10 NOTE — Assessment & Plan Note (Deleted)
Right lumpectomy 12/08/2015: IDC grade 1, 0.6 cm, DCIS with calcifications, intermediate grade, margins negative, 0/7 lymph nodes negative, ER 95%, PR 95%, HER-2 negative ratio 1.16, Ki-67 2%, T1 BN 0 stage IA Adjuvant radiation: 01/19/2016 to 03/07/2016  Treatment plan: Antiestrogen therapy with tamoxifen 20 mg daily started 220 22,018  Tamoxifen toxicities:  Return to clinic in 6 months for follow-up

## 2016-07-30 MED FILL — VENLAFAXINE HCL ER 150 MG C: 150 | 90 days supply | Qty: 90 | Fill #1

## 2016-08-06 MED FILL — TAMOXIFEN 20 MG TABLET: 20 | 90 days supply | Qty: 90 | Fill #1

## 2016-08-06 MED FILL — PROPRANOLOL ER 60 MG CAP: 60 | 90 days supply | Qty: 90 | Fill #1

## 2016-08-28 MED FILL — SYNTHROID 100 MCG TABLET: 100 | 90 days supply | Qty: 90 | Fill #0

## 2016-09-06 DIAGNOSIS — F419 Anxiety disorder, unspecified: Secondary | ICD-10-CM | POA: Diagnosis not present

## 2016-09-06 DIAGNOSIS — E119 Type 2 diabetes mellitus without complications: Secondary | ICD-10-CM | POA: Diagnosis not present

## 2016-09-06 DIAGNOSIS — E039 Hypothyroidism, unspecified: Secondary | ICD-10-CM | POA: Diagnosis not present

## 2016-09-06 DIAGNOSIS — C50911 Malignant neoplasm of unspecified site of right female breast: Secondary | ICD-10-CM | POA: Diagnosis not present

## 2016-09-06 MED FILL — KETOCONAZOLE 2% CREAM: 2 | 20 days supply | Qty: 60 | Fill #0

## 2016-09-07 DIAGNOSIS — E039 Hypothyroidism, unspecified: Secondary | ICD-10-CM | POA: Diagnosis not present

## 2016-09-07 DIAGNOSIS — N390 Urinary tract infection, site not specified: Secondary | ICD-10-CM | POA: Diagnosis not present

## 2016-09-07 DIAGNOSIS — E119 Type 2 diabetes mellitus without complications: Secondary | ICD-10-CM | POA: Diagnosis not present

## 2016-09-11 DIAGNOSIS — H52223 Regular astigmatism, bilateral: Secondary | ICD-10-CM | POA: Diagnosis not present

## 2016-09-11 DIAGNOSIS — H5213 Myopia, bilateral: Secondary | ICD-10-CM | POA: Diagnosis not present

## 2016-09-11 DIAGNOSIS — H524 Presbyopia: Secondary | ICD-10-CM | POA: Diagnosis not present

## 2016-09-18 DIAGNOSIS — H2513 Age-related nuclear cataract, bilateral: Secondary | ICD-10-CM | POA: Diagnosis not present

## 2016-09-18 DIAGNOSIS — Z79899 Other long term (current) drug therapy: Secondary | ICD-10-CM | POA: Diagnosis not present

## 2016-09-18 DIAGNOSIS — H04123 Dry eye syndrome of bilateral lacrimal glands: Secondary | ICD-10-CM | POA: Diagnosis not present

## 2016-09-18 DIAGNOSIS — E119 Type 2 diabetes mellitus without complications: Secondary | ICD-10-CM | POA: Diagnosis not present

## 2016-09-19 MED FILL — SYNTHROID 112 MCG TABLET: 112 | 30 days supply | Qty: 30 | Fill #0

## 2016-10-01 DIAGNOSIS — Z760 Encounter for issue of repeat prescription: Secondary | ICD-10-CM | POA: Diagnosis not present

## 2016-10-25 DIAGNOSIS — E119 Type 2 diabetes mellitus without complications: Secondary | ICD-10-CM | POA: Diagnosis not present

## 2016-10-25 DIAGNOSIS — Z23 Encounter for immunization: Secondary | ICD-10-CM | POA: Diagnosis not present

## 2016-10-25 DIAGNOSIS — C50911 Malignant neoplasm of unspecified site of right female breast: Secondary | ICD-10-CM | POA: Diagnosis not present

## 2016-10-25 DIAGNOSIS — E039 Hypothyroidism, unspecified: Secondary | ICD-10-CM | POA: Diagnosis not present

## 2016-10-25 DIAGNOSIS — Z0000 Encounter for general adult medical examination without abnormal findings: Secondary | ICD-10-CM | POA: Diagnosis not present

## 2016-10-25 MED FILL — SYNTHROID 112 MCG TABLET: 112 | 90 days supply | Qty: 90 | Fill #0

## 2016-10-25 MED FILL — KETOCONAZOLE 2% CREAM: 2 | 14 days supply | Qty: 60 | Fill #0

## 2016-10-26 ENCOUNTER — Telehealth: Payer: Self-pay | Admitting: Hematology and Oncology

## 2016-10-26 NOTE — Telephone Encounter (Signed)
Pt called to r/s SCP and MD appts missed. Gave pt next available date/times per request

## 2016-10-29 ENCOUNTER — Encounter: Payer: Self-pay | Admitting: Adult Health

## 2016-10-29 ENCOUNTER — Telehealth: Payer: Self-pay | Admitting: Adult Health

## 2016-10-29 ENCOUNTER — Ambulatory Visit (HOSPITAL_BASED_OUTPATIENT_CLINIC_OR_DEPARTMENT_OTHER): Payer: 59 | Admitting: Adult Health

## 2016-10-29 VITALS — BP 128/84 | HR 72 | Temp 97.5°F | Resp 18 | Wt 330.8 lb

## 2016-10-29 DIAGNOSIS — Z170 Estrogen receptor positive status [ER+]: Secondary | ICD-10-CM | POA: Diagnosis not present

## 2016-10-29 DIAGNOSIS — R21 Rash and other nonspecific skin eruption: Secondary | ICD-10-CM

## 2016-10-29 DIAGNOSIS — N951 Menopausal and female climacteric states: Secondary | ICD-10-CM

## 2016-10-29 DIAGNOSIS — C50211 Malignant neoplasm of upper-inner quadrant of right female breast: Secondary | ICD-10-CM | POA: Diagnosis not present

## 2016-10-29 NOTE — Progress Notes (Signed)
CLINIC:  Survivorship   REASON FOR VISIT:  Routine follow-up post-treatment for a recent history of breast cancer.  BRIEF ONCOLOGIC HISTORY:    Breast cancer of upper-inner quadrant of right female breast ([place])   10/27/2015 Initial Diagnosis    Screening detected right breast calcifications spanning 4 mm, biopsy intermediate grade DCIS with small focus of invasive ductal carcinoma ER 95%, PR 95%, HER-2 negative ratio 1.16, Ki-67 2%, T1a N0 stage IA      11/09/2015 Genetic Testing    Genetic testing was normal, and did not reveal a deleterious mutation.  Genes tested:  ATM, BARD1, BRCA1, BRCA2, BRIP1, CDH1, CHEK2, EPCAM, FANCC, MLH1, MSH2, MSH6, NBN, PALB2, PMS2, PTEN, RAD51C, RAD51D, TP53, and XRCC2.        12/08/2015 Surgery    Right lumpectomy ([facility]): IDC grade 1, 0.6 cm, DCIS with calcifications, intermediate grade, margins negative, 0/7 lymph nodes negative, ER 95%, PR 95%, HER-2 negative ratio 1.16, Ki-67 2%, T1 BN 0 stage IA      01/19/2016 - 03/07/2016 Radiation Therapy    Adjuvant radiation ([facility]):1) Right breast: 50.4 Gy in 28 fractions.  2) Right breast boost: 10 Gy in 5 fractions.      03/12/2016 -  Anti-estrogen oral therapy    Tamoxifen '20mg'$  daily.  Planned treatment duration: 5 years.       INTERVAL HISTORY:  Marie Wilson presents to the [facility] today for our initial meeting to review her survivorship care plan detailing her treatment course for breast cancer, as well as monitoring long-term side effects of that treatment, education regarding health maintenance, screening, and overall wellness and health promotion.     Overall, Marie Wilson reports feeling quite well.  She is experiencing hot flashes from the Tamoxifen and she is taking Effexor, '150mg'$  per day.  She still is not having much relief with hot flashes she experiences towards the evening.      REVIEW OF SYSTEMS:  Review of Systems  Constitutional: Negative for appetite change, chills,  fatigue, fever and unexpected weight change.  HENT:   Negative for hearing loss and lump/mass.   Eyes: Negative for eye problems and icterus.  Respiratory: Negative for chest tightness, cough and shortness of breath.   Cardiovascular: Negative for chest pain, leg swelling and palpitations.  Gastrointestinal: Negative for abdominal distention, constipation, diarrhea, nausea and vomiting.  Endocrine: Positive for hot flashes.  Musculoskeletal: Negative for arthralgias.  Skin: Negative for itching and rash.  Neurological: Negative for dizziness, extremity weakness, headaches and numbness.  Hematological: Negative for adenopathy. Does not bruise/bleed easily.  Psychiatric/Behavioral: Negative for depression. The patient is not nervous/anxious.   Breast: Red rash on right breast, resolves with radiation cream.      ONCOLOGY TREATMENT TEAM:  1. Surgeon:  Dr. [facility]ley [facility] at [facility] 2. Medical Oncologist: Dr. Lindi Adie  3. Radiation Oncologist: Dr. Sondra Come    PAST MEDICAL/SURGICAL HISTORY:  Past Medical History:  Diagnosis Date  . Anemia    one time  . Anxiety   . Breast cancer of upper-inner quadrant of right female breast ([place]) 10/28/2015  . Cancer ([place])    skin  . Diabetes mellitus without complication ([place])    diet controlled- recent A1C=5  . Family history of breast cancer   . History of radiation therapy 01/19/16-03/07/16   right breast 50.4 Gy in 28 fractions, right breast boost 10 Gy in 5 fractions  . Hypertension    uses inderal for anxiety prevention which helps maintain BP  .  Hypothyroidism   . Pneumonia    Past Surgical History:  Procedure Laterality Date  . BARTHOLIN GLAND CYST EXCISION    . BREAST LUMPECTOMY WITH RADIOACTIVE SEED AND SENTINEL LYMPH NODE BIOPSY Right 12/08/2015   Procedure: BREAST LUMPECTOMY WITH RADIOACTIVE SEED AND SENTINEL LYMPH NODE BIOPSY; WITH LYMPHATIC MAPPING;  Surgeon: Erroll Luna, MD;  Location: [facility];  Service: General;   Laterality: Right;  . CESAREAN SECTION    . CHOLECYSTECTOMY    . ROBOTIC ASSISTED TOTAL HYSTERECTOMY Bilateral 09/08/2014   Procedure: ROBOTIC ASSISTED TOTAL HYSTERECTOMY WITH BILATERAL SALPINGECTOMY WITH LYSIS OF ADHESIONS;  Surgeon: Princess Bruins, MD;  Location: [facility];  Service: Gynecology;  Laterality: Bilateral;  . skin growth       ALLERGIES:  Allergies  Allergen Reactions  . No Known Allergies      CURRENT MEDICATIONS:  Outpatient Encounter Prescriptions as of 10/29/2016  Medication Sig Note  . ALPRAZolam (XANAX) 0.5 MG tablet Take 0.5 mg by mouth daily as needed for anxiety.   Marland Kitchen ibuprofen (ADVIL,MOTRIN) 200 MG tablet Take 400-800 mg by mouth every 6 (six) hours as needed for cramping.   Marland Kitchen levothyroxine (SYNTHROID, LEVOTHROID) 88 MCG tablet Take 88 mcg by mouth daily before breakfast. 12/07/2015: Takes at bedtime  . loratadine (CLARITIN) 10 MG tablet Take 10 mg by mouth daily. 12/07/2015: Takes at bedtime  . Melatonin 3 MG TABS Take 2 tablets by mouth at bedtime as needed (sleep).    . Multiple Vitamins-Minerals (MULTIVITAMIN PO) Take 1 tablet by mouth daily.   Marland Kitchen oxyCODONE-acetaminophen (PERCOCET/ROXICET) 5-325 MG tablet Take 1-2 tablets by mouth every 6 (six) hours as needed for severe pain. (Patient not taking: Reported on 03/15/2016)   . propranolol ER (INDERAL LA) 60 MG 24 hr capsule Take 60 mg by mouth daily. 12/07/2015: Takes at bedtime  . tamoxifen (NOLVADEX) 20 MG tablet Take 1 tablet (20 mg total) by mouth daily. 12/01/2015: Pt to begin tamoxifen after surgery.  Marland Kitchen venlafaxine (EFFEXOR) 75 MG tablet Take 75 mg by mouth at bedtime.    No facility-administered encounter medications on file as of 10/29/2016.      ONCOLOGIC FAMILY HISTORY:  Family History  Problem Relation Age of Onset  . Breast cancer Mother 29  . Breast cancer Sister 59  . Hodgkin's lymphoma Sister        dx in her 107s  . Lung cancer Maternal Uncle   . Lung cancer Paternal Uncle   . Breast  cancer Cousin 39       maternal first cousin  . Thyroid cancer Cousin 93       maternal first cousin     GENETIC COUNSELING/TESTING: Genetic testing was normal, and did not reveal a deleterious mutation.  Genes tested:  ATM, BARD1, BRCA1, BRCA2, BRIP1, CDH1, CHEK2, EPCAM, FANCC, MLH1, MSH2, MSH6, NBN, PALB2, PMS2, PTEN, RAD51C, RAD51D, TP53, and XRCC2.     PHYSICAL EXAMINATION:  Vital Signs:   Vitals:   10/29/16 1016  BP: 128/84  Pulse: 72  Resp: 18  Temp: (!) 97.5 F (36.4 C)  SpO2: 96%   Filed Weights   10/29/16 1016  Weight: (!) 330 lb 12.8 oz (150 kg)   General: Well-nourished, well-appearing female in no acute distress.  She is unaccompanied today.   HEENT: Head is normocephalic.  Pupils equal and reactive to light. Conjunctivae clear without exudate.  Sclerae anicteric. Oral mucosa is pink, moist.  Oropharynx is pink without lesions or erythema.  Lymph: No cervical, supraclavicular, or infraclavicular  lymphadenopathy noted on palpation.  Cardiovascular: Regular rate and rhythm.Marland Kitchen Respiratory: Clear to auscultation bilaterally. Chest expansion symmetric; breathing non-labored.  Breasts: faint erythematous circular macular lesions on right breast.  S/p right lumpectomy, mild amount of scar tissue present, healing well, no nodules or masses present, left breast without nodules, masses, skin or nipple changes GI: Abdomen soft and round; non-tender, non-distended. Bowel sounds normoactive.  GU: Deferred.  Neuro: No focal deficits. Steady gait.  Psych: Mood and affect normal and appropriate for situation.  Extremities: No edema. MSK: No focal spinal tenderness to palpation.  Full range of motion in bilateral upper extremities Skin: Warm and dry.  LABORATORY DATA:  None for this visit.  DIAGNOSTIC IMAGING:  None for this visit.      ASSESSMENT AND PLAN:  Ms.. Wilson is a pleasant 51 y.o. female with Stage IA right breast invasive ductal carcinoma, ER+/PR+/HER2-,  diagnosed in 10/2015, treated with lumpectomy, adjuvant radiation therapy, and anti-estrogen therapy with Tamoxifen beginning in 02/2016.  She presents to the Survivorship Clinic for our initial meeting and routine follow-up post-completion of treatment for breast cancer.    1. Stage IA right breast cancer:  Marie Wilson is continuing to recover from definitive treatment for breast cancer. She will follow-up with her medical oncologist, Dr. Lindi Adie in 6 months with history and physical exam per surveillance protocol.  She will continue her anti-estrogen therapy with Tamoxifen. Thus far, she is tolerating the Tamoxifen moderately well (see #2).  Today, a comprehensive survivorship care plan and treatment summary was reviewed with the patient today detailing her breast cancer diagnosis, treatment course, potential late/long-term effects of treatment, appropriate follow-up care with recommendations for the future, and patient education resources.  A copy of this summary, along with a letter will be sent to the patient's primary care provider via mail/fax/In Basket message after today's visit.    2. Hot flashes: I reviewed her hot flashes with her in detail.  She is already taking more than the dose of effexor that has been notable to help with hot flashes.  She and I reviewed lifestyle modifications that she can employ to help ameliorate her symptoms.  She and I also reviewed the possibility of trying acupuncture as it may help.  She is going to work on what we discussed.    3.  Morbid Obesity: We reviewed her weight and the fact, that she is too heavy and it not only is not good from a recurrence perspective, but in regards to the risk of developing other comorbidities such as diabetes and hypertension.  We reviewed healthy diet and exercise plans, along with the possibility of bariatric surgery down the road if diet and exercise don't work.  She is due for f/u with Dr. Brantley Stage, and I suggested she talk with him, as  many of his partners are skilled bariatric surgeons and would be happy to give her more details so she can make an informed decision.  In the interim, she is going to work on [employer] and exercise, in particular she is looking into the [employer] at the [facility] and I gave her info about this in detail (see below).   4. Breast Rash: I offered to prescribe a steroid ointment for this, but she declined it.  She says her radiation cream is helping.    5. Bone health:  Given Marie Wilson's history of breast cancer she is at slight risk for bone demineralization.  I counseled her that the tamoxifen she is taking  will have a protective effect on her bones.   We reviewed calcium, vitamin d intake, and weight bearing exercise will help promote bone health.    6. Cancer screening:  Due to Marie Wilson's history and her age, she should receive screening for skin cancers, colon cancer, and gynecologic cancers.  The information and recommendations are listed on the patient's comprehensive care plan/treatment summary and were reviewed in detail with the patient.    7. Health maintenance and wellness promotion: Marie Wilson was encouraged to consume 5-7 servings of fruits and vegetables per day. We reviewed the "Nutrition Rainbow" handout, as well as the handout "Take Control of Your Health and Reduce Your Cancer Risk" from the [employer].  She was also encouraged to engage in moderate to vigorous exercise for 30 minutes per day most days of the week. We discussed the [employer] fitness program, which is designed for cancer survivors to help them become more physically fit after cancer treatments.  She was instructed to limit her alcohol consumption and continue to abstain from tobacco use.     8. Support services/counseling: It is not uncommon for this period of the patient's cancer care trajectory to be one of many emotions and stressors.  We discussed an opportunity for her to participate in the  next session of [facility] ("Finding Your New Normal") support group series designed for patients after they have completed treatment.   Marie Wilson was encouraged to take advantage of our many other support services programs, support groups, and/or counseling in coping with her new life as a cancer survivor after completing anti-cancer treatment.  She was offered support today through active listening and expressive supportive counseling.  She was given information regarding our available services and encouraged to contact me with any questions or for help enrolling in any of our support group/programs.    Dispo:   -Return to cancer center in 6 months for follow up with Dr. Lindi Adie  -Mammogram due in 09/2016 -Follow up with Dr. Brantley Stage this month if possible -She is welcome to return back to the Survivorship Clinic at any time; no additional follow-up needed at this time.  -Consider referral back to survivorship as a long-term survivor for continued surveillance  A total of (50) minutes of face-to-face time was spent with this patient with greater than 50% of that time in counseling and care-coordination.   Marie Phlegm, NP Survivorship Program [place] [phone]   Note: PRIMARY CARE PROVIDER Thressa Sheller, [place] [phone]

## 2016-10-29 NOTE — Telephone Encounter (Signed)
Gave patient avs and calendar with upcoming wppts. Called GI to get her scheduled for her mammo.

## 2016-10-31 ENCOUNTER — Ambulatory Visit
Admission: RE | Admit: 2016-10-31 | Discharge: 2016-10-31 | Disposition: A | Discharge status: Home / Self Care | Payer: 59 | Source: Ambulatory Visit | Attending: Adult Health | Admitting: Adult Health

## 2016-10-31 DIAGNOSIS — Z170 Estrogen receptor positive status [ER+]: Principal | ICD-10-CM

## 2016-10-31 DIAGNOSIS — R922 Inconclusive mammogram: Secondary | ICD-10-CM | POA: Diagnosis not present

## 2016-10-31 DIAGNOSIS — C50211 Malignant neoplasm of upper-inner quadrant of right female breast: Secondary | ICD-10-CM

## 2016-10-31 HISTORY — DX: Personal history of irradiation: Z92.3

## 2016-10-31 HISTORY — DX: Malignant neoplasm of unspecified site of unspecified female breast: C50.919

## 2016-11-06 ENCOUNTER — Ambulatory Visit: Payer: 59 | Admitting: Hematology and Oncology

## 2016-11-07 DIAGNOSIS — N61 Mastitis without abscess: Secondary | ICD-10-CM | POA: Diagnosis not present

## 2016-11-07 MED FILL — AMOX-CLAV 875-125 MG TABLET: 875-125 | 7 days supply | Qty: 14 | Fill #0

## 2016-11-09 DIAGNOSIS — Z803 Family history of malignant neoplasm of breast: Secondary | ICD-10-CM | POA: Diagnosis not present

## 2016-11-09 DIAGNOSIS — L7634 Postprocedural seroma of skin and subcutaneous tissue following other procedure: Secondary | ICD-10-CM | POA: Diagnosis not present

## 2016-11-09 DIAGNOSIS — N61 Mastitis without abscess: Secondary | ICD-10-CM | POA: Diagnosis not present

## 2016-11-16 MED FILL — GABAPENTIN 300 MG CAPSULE: 300 | 10 days supply | Qty: 30 | Fill #0

## 2016-11-16 MED FILL — VENLAFAXINE HCL ER 150 MG C: 150 | 90 days supply | Qty: 90 | Fill #0

## 2016-11-23 ENCOUNTER — Encounter: Payer: Self-pay | Admitting: Adult Health

## 2016-11-28 ENCOUNTER — Other Ambulatory Visit: Payer: Self-pay

## 2016-11-28 DIAGNOSIS — C50211 Malignant neoplasm of upper-inner quadrant of right female breast: Secondary | ICD-10-CM

## 2016-11-28 DIAGNOSIS — Z170 Estrogen receptor positive status [ER+]: Principal | ICD-10-CM

## 2016-11-29 ENCOUNTER — Encounter: Payer: Self-pay | Admitting: Physical Therapy

## 2016-11-29 ENCOUNTER — Ambulatory Visit: Payer: 59 | Attending: Hematology and Oncology | Admitting: Physical Therapy

## 2016-11-29 DIAGNOSIS — I890 Lymphedema, not elsewhere classified: Secondary | ICD-10-CM | POA: Diagnosis not present

## 2016-11-29 DIAGNOSIS — R293 Abnormal posture: Secondary | ICD-10-CM | POA: Diagnosis not present

## 2016-11-29 DIAGNOSIS — M79601 Pain in right arm: Secondary | ICD-10-CM | POA: Insufficient documentation

## 2016-11-29 NOTE — Addendum Note (Signed)
Addended by: Arbutus Ped C on: 11/29/2016 11:10 AM   Modules accepted: Orders

## 2016-11-29 NOTE — Therapy (Addendum)
[place], [place], [address] Phone: [phone]   Fax:  [phone]  Physical Therapy Evaluation  Patient Details  Name: Marie Wilson MRN: [id number] Date of Birth: [birth year] Referring Provider: Dr. Nicholas Lose  Encounter Date: 11/29/2016      PT End of Session - 11/29/16 1023    Visit Number 1   Number of Visits 8   Date for PT Re-Evaluation 12/27/16   PT Start Time 0811   PT Stop Time 0858   PT Time Calculation (min) 47 min   Activity Tolerance Patient tolerated treatment well   Behavior During Therapy [facility] for tasks assessed/performed      Past Medical History:  Diagnosis Date  . Anemia    one time  . Anxiety   . Breast cancer ([place])    right 2017  . Breast cancer of upper-inner quadrant of right female breast ([place]) 10/28/2015  . Cancer ([place])    skin  . Diabetes mellitus without complication ([place])    diet controlled- recent A1C=5  . Family history of breast cancer   . History of radiation therapy 01/19/16-03/07/16   right breast 50.4 Gy in 28 fractions, right breast boost 10 Gy in 5 fractions  . Hypertension    uses inderal for anxiety prevention which helps maintain BP  . Hypothyroidism   . Personal history of radiation therapy   . Pneumonia     Past Surgical History:  Procedure Laterality Date  . BARTHOLIN GLAND CYST EXCISION    . BREAST BIOPSY    . BREAST LUMPECTOMY     right 2017  . BREAST LUMPECTOMY WITH RADIOACTIVE SEED AND SENTINEL LYMPH NODE BIOPSY Right 12/08/2015   Procedure: BREAST LUMPECTOMY WITH RADIOACTIVE SEED AND SENTINEL LYMPH NODE BIOPSY; WITH LYMPHATIC MAPPING;  Surgeon: Erroll Luna, MD;  Location: [place];  Service: General;  Laterality: Right;  . CESAREAN SECTION    . CHOLECYSTECTOMY    . ROBOTIC ASSISTED TOTAL HYSTERECTOMY Bilateral 09/08/2014   Procedure: ROBOTIC ASSISTED TOTAL HYSTERECTOMY WITH BILATERAL SALPINGECTOMY WITH LYSIS OF ADHESIONS;  Surgeon:  Princess Bruins, MD;  Location: [facility];  Service: Gynecology;  Laterality: Bilateral;  . skin growth      There were no vitals filed for this visit.       Subjective Assessment - 11/29/16 0819    Subjective She reports right lumpectomy and sentinel node biopsy (7 nodes removed) on 12/08/15 followed by radiation to right breast (nodes negative) and currently takes Tamoxifen. Recent mammogram was normal. Right breast cellulitis shortly following mammogram on 11/07/16. The surgeon put me on antibiotics but he had to drain my breast but there's still a knot there. My right arm and hand began swelling began around that time.    Pertinent History Right lumpectomy and SLNB with 7 nodes removed 12/07/16 followed by radiation and tamoxifen. Right breast cellulitis 1 month ago and right arm swelling over the past month.   Patient Stated Goals Reduce arm swelling and arm pain   Currently in Pain? Yes   Pain Score 3    Pain Location Arm   Pain Orientation Right   Pain Descriptors / Indicators Nagging   Pain Type Neuropathic pain   Pain Onset 1 to 4 weeks ago   Pain Frequency Constant   Aggravating Factors  Bumping arm or something touching arm   Pain Relieving Factors Unknown            [facility] PT Assessment - 11/29/16 0001  Assessment   Medical Diagnosis Right arm lymphedema   Referring Provider Dr. Nicholas Lose   Onset Date/Surgical Date 11/07/16   Hand Dominance Left   Prior Therapy none     Precautions   Precautions Other (comment)  Right arm lymphedema risk     Restrictions   Weight Bearing Restrictions No     Balance Screen   Has the patient fallen in the past 6 months No   Has the patient had a decrease in activity level because of a fear of falling?  No   Is the patient reluctant to leave their home because of a fear of falling?  No     Home Environment   Living Environment Private residence   Living Arrangements Children  93 y.o. son   Available Help at Discharge  Family     Prior Function   Level of Independence Independent   Vocation Full time employment   Scientist, research (life sciences) at [employer] She does not exercise     Cognition   Overall Cognitive Status Within Functional Limits for tasks assessed     Observation/Other Assessments   Other Surveys  --  Lymphedema Life Impact Scale 6% impairment     Posture/Postural Control   Posture/Postural Control Postural limitations   Postural Limitations Rounded Shoulders;Forward head     ROM / Strength   AROM / PROM / Strength AROM     AROM   AROM Assessment Site Shoulder   Right/Left Shoulder Right;Left   Right Shoulder Extension 52 Degrees   Right Shoulder Flexion 137 Degrees   Right Shoulder ABduction 148 Degrees   Right Shoulder Internal Rotation 71 Degrees   Right Shoulder External Rotation 90 Degrees   Left Shoulder Extension 51 Degrees   Left Shoulder Flexion 144 Degrees   Left Shoulder ABduction 159 Degrees   Left Shoulder Internal Rotation 74 Degrees   Left Shoulder External Rotation 89 Degrees     Palpation   Palpation comment Palpable tightness and tenderness at axillary incision site; positive neural tension           LYMPHEDEMA/ONCOLOGY QUESTIONNAIRE - 11/29/16 0836      Type   Cancer Type Right breast     Surgeries   Lumpectomy Date 12/08/15   Sentinel Lymph Node Biopsy Date 12/08/15   Number Lymph Nodes Removed 7     Date Lymphedema/Swelling Started   Date 11/07/16     Treatment   Active Chemotherapy Treatment No   Past Chemotherapy Treatment No   Active Radiation Treatment No   Past Radiation Treatment Yes   Body Site right breast   Current Hormone Treatment Yes   Drug Name Tamoxifen     What other symptoms do you have   Are you Having Heaviness or Tightness Yes   Are you having Pain Yes   Are you having pitting edema No   Do you have infections Yes   Comments breast cellulitis 9/18   Is there Decreased scar mobility Yes    [place] Sign No     Lymphedema Assessments   Lymphedema Assessments Upper extremities     Right Upper Extremity Lymphedema   At Axilla  42.8 cm   15 cm Proximal to Olecranon Process 42.5 cm   10 cm Proximal to Olecranon Process 42 cm   Olecranon Process 32.5 cm   15 cm Proximal to Ulnar Styloid Process 31.5 cm   10 cm Proximal to Ulnar Styloid Process 28.6 cm  Just Proximal to Ulnar Styloid Process 18.6 cm   Across Hand at [employer] 19.3 cm   At [place] of 2nd Digit 6.7 cm     Left Upper Extremity Lymphedema   At Axilla  40.8 cm   15 cm Proximal to Olecranon Process 42.2 cm   10 cm Proximal to Olecranon Process 41.9 cm   Olecranon Process 31.8 cm   15 cm Proximal to Ulnar Styloid Process 30.9 cm   10 cm Proximal to Ulnar Styloid Process 26.8 cm   Just Proximal to Ulnar Styloid Process 18.1 cm   Across Hand at [employer] 18.9 cm   At [place] of 2nd Digit 6.4 cm         Objective measurements completed on examination: See above findings.                  PT Education - 11/29/16 1022    Education provided Yes   Education Details Instructed pt with right arm neural tension stretch in sitting and standing; instructed pt with getting a 20-30 mmHg compression sleeve and glove   Person(s) Educated Patient   Methods Explanation;Demonstration;Verbal cues   Comprehension Verbalized understanding                [facility] Goals - 11/29/16 1036      CC Long Term Goal  #1   Title Patient will report >/= 25% less pain in right arm to tolerate work and home tasks with greater ease.   Time 4   Period Weeks   Status New   Target Date 12/27/16     CC Long Term Goal  #2   Title Patient will verbalize understanding of where and how to get compression sleeve and glove and the importance of wearing it regularly for at least 4 weeks to reduce symptoms.   Time 4   Period Weeks   Status New   Target Date 12/27/16     CC Long Term Goal  #3   Title  Increase right shoulder flexion to >/= 145 for increased ease reaching.   Time 4   Period Weeks   Status New   Target Date 12/27/16     CC Long Term Goal  #4   Title Increase right shoulder abduction to >/= 155 for increased ease reaching.   Time 4   Period Weeks   Status New   Target Date 12/27/16     CC Long Term Goal  #5   Title Patient will report >/= 25% improvement in feeling of fullness and edema in right arm and hand.   Time 4   Period Weeks   Status New             Plan - 11/29/16 1027    Clinical Impression Statement Patient is a very pleasant woman who had a recent episode of right breast cellulitis. She has a history of right breast lumpectomy with 7 axillary nodes removed 12/08/15 followed by radiation. She is currently on Tamoxifen. Her right arm pain began after her breast cellulitis and has concerns of lymphedema. Arm circumferential measurements did not appear to be significantly changed but her dorsal hand does appear puffy. She also may have cording that is not visible in her arm due to obesity. Her symptoms indicate cording as she c/o a "line" of pain from her axilla down to her mid forearm. She will benefit from physical therapy to reduce swelling, cording, and pain.   History  and Personal Factors relevant to plan of care: Lives with 6 y.o. son and is sole caregiver.   Clinical Presentation Stable   Clinical Decision Making Low   Rehab Potential Excellent   Clinical Impairments Affecting Rehab Potential None   PT Frequency 2x / week   PT Duration 4 weeks   PT Treatment/Interventions ADLs/Self Care Home Management;Patient/family education;DME Instruction;Therapeutic exercise;Therapeutic activities;Manual techniques;Manual lymph drainage;Passive range of motion;Scar mobilization   PT Next Visit Plan Assess garments if pt was able to get them; encourage 4 weeks of regular wear to reduce new onset of lymphedema symptoms; begin manual lymph drainage for right arm  and breast; PROM right UE; neural stretching   PT Home Exercise Plan Neural stretch right UE   Consulted and Agree with Plan of Care Patient      Patient will benefit from skilled therapeutic intervention in order to improve the following deficits and impairments:  Pain, Obesity, Decreased scar mobility, Decreased knowledge of precautions, Increased edema, Increased fascial restricitons, Impaired UE functional use, Decreased knowledge of use of DME, Decreased range of motion, Postural dysfunction  Visit Diagnosis: Pain in right arm - Plan: PT plan of care cert/re-cert  Lymphedema, not elsewhere classified - Plan: PT plan of care cert/re-cert  Abnormal posture - Plan: PT plan of care cert/re-cert     Problem List Patient Active Problem List   Diagnosis Date Noted  . Genetic testing 11/22/2015  . Family history of breast cancer   . Breast cancer of upper-inner quadrant of right female breast ([place]) 10/28/2015  . Postoperative state 09/08/2014    Annia Friendly, PT 11/29/16 10:42 AM  [place], [place], [address] Phone: [phone]   Fax:  [phone]  Name: Marie Wilson MRN: [id number] Date of Birth: 1965-04-20   PHYSICAL THERAPY DISCHARGE SUMMARY  Visits from Start of Care: 1  Current functional level related to goals / functional outcomes: Unknown. Patient did not return to PT after her evaluation.   Remaining deficits: Unknown. Patient did not return to PT after her evaluation.   Education / Equipment: Lymphedema education Plan: Patient agrees to discharge.  Patient goals were not met. Patient is being discharged due to not returning since the last visit.  ?????  At the time of her evaluation, it was recommended that she attend therapy 2x/week for 4 weeks to reduce her hand edema. She did not return to PT after her eval. I'd be happy to see her if needed in the future. Thanks,  Larena Sox, [place] 03/25/17 2:38 PM

## 2016-11-30 ENCOUNTER — Other Ambulatory Visit: Payer: Self-pay | Admitting: *Deleted

## 2016-11-30 DIAGNOSIS — C50211 Malignant neoplasm of upper-inner quadrant of right female breast: Secondary | ICD-10-CM

## 2016-12-03 ENCOUNTER — Other Ambulatory Visit: Payer: Self-pay | Admitting: Hematology and Oncology

## 2016-12-03 MED FILL — TAMOXIFEN CITRATE 20 MG TAB: 20 | 90 days supply | Qty: 90 | Fill #0

## 2016-12-04 MED FILL — PROPRANOLOL ER 60 MG CAP: 60 | 90 days supply | Qty: 90 | Fill #0

## 2016-12-11 ENCOUNTER — Ambulatory Visit: Payer: 59

## 2016-12-13 ENCOUNTER — Ambulatory Visit: Payer: 59 | Admitting: Physical Therapy

## 2016-12-21 ENCOUNTER — Encounter: Payer: 59 | Admitting: Physical Therapy

## 2016-12-26 DIAGNOSIS — F330 Major depressive disorder, recurrent, mild: Secondary | ICD-10-CM | POA: Diagnosis not present

## 2016-12-26 DIAGNOSIS — F411 Generalized anxiety disorder: Secondary | ICD-10-CM | POA: Diagnosis not present

## 2017-01-03 ENCOUNTER — Encounter: Payer: Self-pay | Admitting: Radiation Oncology

## 2017-01-03 ENCOUNTER — Other Ambulatory Visit: Payer: Self-pay

## 2017-01-03 ENCOUNTER — Ambulatory Visit
Admission: RE | Admit: 2017-01-03 | Discharge: 2017-01-03 | Disposition: A | Discharge status: Home / Self Care | Payer: 59 | Source: Ambulatory Visit | Attending: Radiation Oncology | Admitting: Radiation Oncology

## 2017-01-03 VITALS — BP 130/91 | HR 74 | Temp 97.5°F | Ht 67.0 in | Wt 327.0 lb

## 2017-01-03 DIAGNOSIS — Z791 Long term (current) use of non-steroidal anti-inflammatories (NSAID): Secondary | ICD-10-CM | POA: Diagnosis not present

## 2017-01-03 DIAGNOSIS — Y842 Radiological procedure and radiotherapy as the cause of abnormal reaction of the patient, or of later complication, without mention of misadventure at the time of the procedure: Secondary | ICD-10-CM | POA: Insufficient documentation

## 2017-01-03 DIAGNOSIS — M7989 Other specified soft tissue disorders: Secondary | ICD-10-CM | POA: Insufficient documentation

## 2017-01-03 DIAGNOSIS — N6459 Other signs and symptoms in breast: Secondary | ICD-10-CM | POA: Diagnosis not present

## 2017-01-03 DIAGNOSIS — Z923 Personal history of irradiation: Secondary | ICD-10-CM | POA: Insufficient documentation

## 2017-01-03 DIAGNOSIS — C50211 Malignant neoplasm of upper-inner quadrant of right female breast: Secondary | ICD-10-CM | POA: Insufficient documentation

## 2017-01-03 DIAGNOSIS — Z08 Encounter for follow-up examination after completed treatment for malignant neoplasm: Secondary | ICD-10-CM | POA: Diagnosis not present

## 2017-01-03 DIAGNOSIS — Z170 Estrogen receptor positive status [ER+]: Secondary | ICD-10-CM | POA: Diagnosis not present

## 2017-01-03 DIAGNOSIS — Z79891 Long term (current) use of opiate analgesic: Secondary | ICD-10-CM | POA: Insufficient documentation

## 2017-01-03 DIAGNOSIS — Z79810 Long term (current) use of selective estrogen receptor modulators (SERMs): Secondary | ICD-10-CM | POA: Insufficient documentation

## 2017-01-03 DIAGNOSIS — Z79899 Other long term (current) drug therapy: Secondary | ICD-10-CM | POA: Diagnosis not present

## 2017-01-03 MED FILL — ALPRAZolam 0.5 MG TABS: 0.5 | 30 days supply | Qty: 60 | Fill #0

## 2017-01-03 NOTE — Progress Notes (Signed)
Radiation Oncology         [phone] ________________________________  Name: Marie Wilson MRN: [id number]  Date: 01/03/2017  DOB: [birth year]  Follow-Up Visit Note  CC: Marie Commons, FNP  Marie Lose, MD    ICD-10-CM   1. Malignant neoplasm of upper-inner quadrant of right female breast, unspecified estrogen receptor status ([place]) C50.211     Diagnosis:   Wilson IA (T1b, pN0), grade 1 invasive ductal carcinoma of the right breast (ER/PR +, HER2 -)  Interval Since Last Radiation:  9 months  01/19/16 - 03/07/16: 1) Right breast: 50.4 Gy in 28 fractions. 2) Right breast boost: 10 Gy in 5 fractions.  Narrative: The patient returns today for a routine follow up. The pt is doing well overall. The patient saw Dr. Lindi Wilson in medical oncology on 04/01/16. She was started on Tamoxifen on 04/15/2016 and continues to take it. She reports having hot flashes that wake her at night. Pt reports mild swelling in her right hand to which she was evaluated at the Lymphedema clinic and advised to wear a compression stocking. Pt denies nipple discharge or soreness in the right breast. She did have a recent episode of cellulitis involving the right breast and did have some fluid removed from the breast by Dr. Brantley Wilson.  ALLERGIES:  is allergic to no known allergies.  Meds: Current Outpatient Medications  Medication Sig Dispense Refill  . ALPRAZolam (XANAX) 0.5 MG tablet Take 0.5 mg by mouth daily as needed for anxiety.    Marland Kitchen ibuprofen (ADVIL,MOTRIN) 200 MG tablet Take 400-800 mg by mouth every 6 (six) hours as needed for cramping.    . Levothyroxine Sodium 112 MCG CAPS Take 112 mcg by mouth daily before breakfast.     . loratadine (CLARITIN) 10 MG tablet Take 10 mg by mouth daily.    . Multiple Vitamins-Minerals (MULTIVITAMIN PO) Take 1 tablet by mouth daily.    . propranolol ER (INDERAL LA) 60 MG 24 hr capsule Take 60 mg by mouth daily.    . tamoxifen (NOLVADEX) 20 MG tablet TAKE 1 TABLET BY  MOUTH DAILY. 90 tablet 3  . VENLAFAXINE HCL PO Take 150 mg by mouth at bedtime.     . Melatonin 3 MG TABS Take 2 tablets by mouth at bedtime as needed (sleep).     Marland Kitchen oxyCODONE-acetaminophen (PERCOCET/ROXICET) 5-325 MG tablet Take 1-2 tablets by mouth every 6 (six) hours as needed for severe pain. (Patient not taking: Reported on 03/15/2016) 30 tablet 0   No current facility-administered medications for this encounter.     Physical Findings: The patient is in no acute distress. Patient is alert and oriented.  height is '5\' 7"'  (1.702 m) and weight is 327 lb (148.3 kg) (abnormal). Her oral temperature is 97.5 F (36.4 C) (abnormal). Her blood pressure is 130/91 (abnormal) and her pulse is 74. Her oxygen saturation is 100%.   Lungs are clear to auscultation bilaterally. Heart has regular rate and rhythm. No palpable cervical, supraclavicular, or axillary adenopathy.  Right breast shows no palpable mass or nipple discharge. Patient continues to have edema in the nipple areolar complex. Mild induration at lumpectomy scar consistent with surgery. No dominant mass, nipple discharge, or bleeding.  Left breast shows no palpable mass or nipple discharge.   Lab Findings: Lab Results  Component Value Date   WBC 8.1 12/08/2015   HGB 13.9 12/08/2015   HCT 42.7 12/08/2015   MCV 86.8 12/08/2015   PLT 277 12/08/2015  Radiographic Findings: No results found.  Impression:  The patient is recovering from the effects of radiation. No sign of recurrence on clinical exam.   Plan: The patient will follow up with radiation oncology PRN. Pt will continue follow up in medical oncology and surgery.  ____________________________________ -----------------------------------  Marie Promise, PhD, MD  This document serves as a record of services personally performed by Marie Pray, MD. It was created on his behalf by Marie Wilson, a trained medical scribe. The creation of this record is based on the scribe's  personal observations and the provider's statements to them. This document has been checked and approved by the attending provider.

## 2017-01-03 NOTE — Progress Notes (Signed)
Marie Wilson is here for follow up after treatment to her right breast.  She denies having any pain or fatigue.  She said she had cellulitis of her right breast about 2 months ago after her mammogram.  She saw Dr. Brantley Stage who drained some fluid and prescribed antibiotics.  She is currently taking tamoxifen.  The skin on her right breast has slight hyperpigmentation.  She has an area of firmness under her lumpectomy scar.  BP (!) 130/91 (BP Location: Left Arm, Patient Position: Sitting)   Pulse 74   Temp (!) 97.5 F (36.4 C) (Oral)   Ht 5\' 7"  (1.702 m)   Wt (!) 327 lb (148.3 kg)   LMP  (LMP Unknown) Comment: DUB  SpO2 100%   BMI 51.22 kg/m    Wt Readings from Last 3 Encounters:  01/03/17 (!) 327 lb (148.3 kg)  10/29/16 (!) 330 lb 12.8 oz (150 kg)  05/07/16 (!) 324 lb (147 kg)

## 2017-02-26 MED FILL — VENLAFAXINE HCL ER 150 MG C: 150 | 90 days supply | Qty: 90 | Fill #1

## 2017-03-01 MED FILL — SYNTHROID 112 MCG TABLET: 112 | 90 days supply | Qty: 90 | Fill #0

## 2017-03-21 MED FILL — TAMOXIFEN CITRATE 20 MG TAB: 20 | 90 days supply | Qty: 90 | Fill #1

## 2017-03-21 MED FILL — PROPRANOLOL ER 60 MG CAP: 60 | 90 days supply | Qty: 90 | Fill #1

## 2017-04-17 DIAGNOSIS — E039 Hypothyroidism, unspecified: Secondary | ICD-10-CM | POA: Diagnosis not present

## 2017-04-17 DIAGNOSIS — E781 Pure hyperglyceridemia: Secondary | ICD-10-CM | POA: Diagnosis not present

## 2017-04-17 DIAGNOSIS — E119 Type 2 diabetes mellitus without complications: Secondary | ICD-10-CM | POA: Diagnosis not present

## 2017-04-19 ENCOUNTER — Encounter: Payer: Self-pay | Admitting: Nurse Practitioner

## 2017-04-19 ENCOUNTER — Ambulatory Visit: Payer: Self-pay | Admitting: Nurse Practitioner

## 2017-04-19 VITALS — BP 122/90 | HR 100 | Temp 98.2°F | Wt 331.4 lb

## 2017-04-19 DIAGNOSIS — L03113 Cellulitis of right upper limb: Secondary | ICD-10-CM

## 2017-04-19 MED ORDER — CEPHALEXIN 500 MG PO CAPS: 500.0000 mg | ORAL_CAPSULE | Freq: Three times a day (TID) | ORAL | 0 refills | Status: DC

## 2017-04-19 MED ORDER — HYDROCORTISONE 2.5 % EX OINT: TOPICAL_OINTMENT | Freq: Two times a day (BID) | CUTANEOUS | 0 refills | Status: AC

## 2017-04-19 MED FILL — HYDROCORTISONE 2.5% OINT: 2.5 | 10 days supply | Qty: 28 | Fill #0

## 2017-04-19 MED FILL — CEPHALEXIN 500 MG CAPSULE: 500 | 10 days supply | Qty: 30 | Fill #0

## 2017-04-19 NOTE — Progress Notes (Signed)
Subjective:     Marie Wilson is a 52 y.o. female who presents for evaluation of a rash involving the right antecubital. Rash started 1 week ago. Lesions are erythematous, and flat in texture. Rash has not changed over time. Rash is painful and is pruritic. Associated symptoms: none. Patient denies: abdominal pain, congestion, cough, fever, headache, nausea, sore throat and vomiting. Patient has not had contacts with similar rash. Patient has not had new exposures (soaps, lotions, laundry detergents, foods, medications, plants, insects or animals).  Patient is status post lumpectomy, right breast.  Patient states she has a history of cellulitis on her right shoulder post surgery.  Patient states that pain feels like it is neuropathic.  Patient also endorses increased lymphadema to RUE.  Patient has tried cream on the area, but with minimal relief.   The following portions of the patient's history were reviewed and updated as appropriate: allergies, current medications and past medical history.  Review of Systems Constitutional: negative Eyes: negative Ears, nose, mouth, throat, and face: negative Respiratory: negative Cardiovascular: negative Integument/breast: positive for pruritus, rash and skin color change, negative for skin lesion(s)    Objective:    LMP  (LMP Unknown) Comment: DUB General:  alert, cooperative and no distress  Skin:  rash noted on right upper antecubital extending to inner antecubital region     Assessment:    Cellulitis, Right Upper Extremity    Plan:   1. Cellulitis, Right Upper Extremity Meds ordered this encounter  Medications  . cephALEXin (KEFLEX) 500 MG capsule    Sig: Take 1 capsule (500 mg total) by mouth 3 (three) times daily for 10 days.    Dispense:  30 capsule    Refill:  0    Order Specific Question:   Supervising Provider    Answer:   Ricard Dillon [[id number]]  . hydrocortisone 2.5 % ointment    Sig: Apply topically 2 (two) times daily for 10  days. Apply to affected area twice daily.    Dispense:  28.35 g    Refill:  0    Order Specific Question:   Supervising Provider    Answer:   Ricard Dillon [[id number]]   Patient instructed to contact [facility] or follow up with Oncologist regarding neuropathic pain on same side as lumpectomy.  Patient to take Keflex as directed.  Ibuprofen for pain.  Discussed with patient concern regarding increased lymphadema and neuropathic pain.  Patient verbalizes understanding.

## 2017-04-19 NOTE — Patient Instructions (Addendum)
Cellulitis, Adult Cellulitis is a skin infection. The infected area is usually red and tender. This condition occurs most often in the arms and lower legs. The infection can travel to the muscles, blood, and underlying tissue and become serious. It is very important to get treated for this condition. What are the causes? Cellulitis is caused by bacteria. The bacteria enter through a break in the skin, such as a cut, burn, insect bite, open sore, or crack. What increases the risk? This condition is more likely to occur in people who:  Have a weak defense system (immune system).  Have open wounds on the skin such as cuts, burns, bites, and scrapes. Bacteria can enter the body through these open wounds.  Are older.  Have diabetes.  Have a type of long-lasting (chronic) liver disease (cirrhosis) or kidney disease.  Use IV drugs.  What are the signs or symptoms? Symptoms of this condition include:  Redness, streaking, or spotting on the skin.  Swollen area of the skin.  Tenderness or pain when an area of the skin is touched.  Warm skin.  Fever.  Chills.  Blisters.  How is this diagnosed? This condition is diagnosed based on a medical history and physical exam. You may also have tests, including:  Blood tests.  Lab tests.  Imaging tests.  How is this treated? Treatment for this condition may include:  Medicines, such as antibiotic medicines or antihistamines.  Supportive care, such as rest and application of cold or warm cloths (cold or warm compresses) to the skin.  Hospital care, if the condition is severe.  The infection usually gets better within 1-2 days of treatment. Follow these instructions at home:  Take over-the-counter and prescription medicines only as told by your health care provider.  If you were prescribed an antibiotic medicine, take it as told by your health care provider. Do not stop taking the antibiotic even if you start to feel  better.  Drink enough fluid to keep your urine clear or pale yellow.  Do not touch or rub the infected area.  Raise (elevate) the infected area above the level of your heart while you are sitting or lying down.  Apply warm or cold compresses to the affected area as told by your health care provider.  Keep all follow-up visits as told by your health care provider. This is important. These visits let your health care provider make sure a more serious infection is not developing.  Keflex 3 times daily for 10 days.  Apply hydrocortisone ointment to affected area twice daily.  Follow up with [facility] or Oncologist for lymphadema in right upper extremity and neuropathic pain.  Contact a health care provider if:  You have a fever.  Your symptoms do not improve within 1-2 days of starting treatment.  Your bone or joint underneath the infected area becomes painful after the skin has healed.  Your infection returns in the same area or another area.  You notice a swollen bump in the infected area.  You develop new symptoms.  You have a general ill feeling (malaise) with muscle aches and pains. Get help right away if:  Your symptoms get worse.  You feel very sleepy.  You develop vomiting or diarrhea that persists.  You notice red streaks coming from the infected area.  Your red area gets larger or turns dark in color. This information is not intended to replace advice given to you by your health care provider. Make sure you discuss any  questions you have with your health care provider. Document Released: 11/15/2004 Document Revised: 06/16/2015 Document Reviewed: 12/15/2014 Elsevier Interactive Patient Education  [employer].

## 2017-04-24 DIAGNOSIS — E039 Hypothyroidism, unspecified: Secondary | ICD-10-CM | POA: Diagnosis not present

## 2017-04-24 DIAGNOSIS — R748 Abnormal levels of other serum enzymes: Secondary | ICD-10-CM | POA: Diagnosis not present

## 2017-04-24 DIAGNOSIS — F419 Anxiety disorder, unspecified: Secondary | ICD-10-CM | POA: Diagnosis not present

## 2017-04-24 DIAGNOSIS — L03113 Cellulitis of right upper limb: Secondary | ICD-10-CM | POA: Diagnosis not present

## 2017-04-24 DIAGNOSIS — E781 Pure hyperglyceridemia: Secondary | ICD-10-CM | POA: Diagnosis not present

## 2017-04-24 DIAGNOSIS — L219 Seborrheic dermatitis, unspecified: Secondary | ICD-10-CM | POA: Diagnosis not present

## 2017-04-24 DIAGNOSIS — C50911 Malignant neoplasm of unspecified site of right female breast: Secondary | ICD-10-CM | POA: Diagnosis not present

## 2017-04-24 DIAGNOSIS — I890 Lymphedema, not elsewhere classified: Secondary | ICD-10-CM | POA: Diagnosis not present

## 2017-04-24 DIAGNOSIS — E119 Type 2 diabetes mellitus without complications: Secondary | ICD-10-CM | POA: Diagnosis not present

## 2017-04-29 ENCOUNTER — Telehealth: Payer: Self-pay | Admitting: Hematology and Oncology

## 2017-04-29 ENCOUNTER — Inpatient Hospital Stay: Payer: 59 | Attending: Hematology and Oncology | Admitting: Hematology and Oncology

## 2017-04-29 DIAGNOSIS — Z79899 Other long term (current) drug therapy: Secondary | ICD-10-CM | POA: Insufficient documentation

## 2017-04-29 DIAGNOSIS — Z923 Personal history of irradiation: Secondary | ICD-10-CM | POA: Diagnosis not present

## 2017-04-29 DIAGNOSIS — Z79810 Long term (current) use of selective estrogen receptor modulators (SERMs): Secondary | ICD-10-CM | POA: Insufficient documentation

## 2017-04-29 DIAGNOSIS — C50211 Malignant neoplasm of upper-inner quadrant of right female breast: Secondary | ICD-10-CM

## 2017-04-29 DIAGNOSIS — Z170 Estrogen receptor positive status [ER+]: Secondary | ICD-10-CM | POA: Insufficient documentation

## 2017-04-29 MED ORDER — TAMOXIFEN CITRATE 20 MG PO TABS: 20.0000 mg | ORAL_TABLET | Freq: Every day | ORAL | 3 refills | Status: DC

## 2017-04-29 MED ORDER — VENLAFAXINE HCL ER 75 MG PO CP24: 75.0000 mg | ORAL_CAPSULE | Freq: Every day | ORAL | 6 refills | Status: DC

## 2017-04-29 MED FILL — VENLAFAXINE HCL ER 75 MG CA: 75 | 30 days supply | Qty: 30 | Fill #0

## 2017-04-29 NOTE — Telephone Encounter (Signed)
Gave patient AVs and calendar of upcoming march 2020 appointments.  °

## 2017-04-29 NOTE — Assessment & Plan Note (Signed)
Right lumpectomy 12/08/2015: IDC grade 1, 0.6 cm, DCIS with calcifications, intermediate grade, margins negative, 0/7 lymph nodes negative, ER 95%, PR 95%, HER-2 negative ratio 1.16, Ki-67 2%, T1 BN 0 stage IA Adjuvant radiation: 01/19/2016 to 03/07/2016  Treatment plan: Antiestrogen therapy with tamoxifen 20 mg daily started 04/12/2016 Tamoxifen toxicities:   Return to clinic in 6 months for follow-up and after that she could be seen once a year

## 2017-04-29 NOTE — Progress Notes (Signed)
Patient Care Team: Holland Commons, FNP as PCP - General (Internal Medicine) Erroll Luna, MD as Consulting Physician (General Surgery) Nicholas Lose, MD as Consulting Physician (Hematology and Oncology) Gery Pray, MD as Consulting Physician (Radiation Oncology) Delice Bison Charlestine Massed, NP as Nurse Practitioner (Hematology and Oncology)  DIAGNOSIS:  Encounter Diagnosis  Name Primary?  . Malignant neoplasm of upper-inner quadrant of right female breast, unspecified estrogen receptor status ([place])     SUMMARY OF ONCOLOGIC HISTORY:   Breast cancer of upper-inner quadrant of right female breast ([place])   10/27/2015 Initial Diagnosis    Screening detected right breast calcifications spanning 4 mm, biopsy intermediate grade DCIS with small focus of invasive ductal carcinoma ER 95%, PR 95%, HER-2 negative ratio 1.16, Ki-67 2%, T1a N0 stage IA      11/09/2015 Genetic Testing    Genetic testing was normal, and did not reveal a deleterious mutation.  Genes tested:  ATM, BARD1, BRCA1, BRCA2, BRIP1, CDH1, CHEK2, EPCAM, FANCC, MLH1, MSH2, MSH6, NBN, PALB2, PMS2, PTEN, RAD51C, RAD51D, TP53, and XRCC2.        12/08/2015 Surgery    Right lumpectomy ([place]): IDC grade 1, 0.6 cm, DCIS with calcifications, intermediate grade, margins negative, 0/7 lymph nodes negative, ER 95%, PR 95%, HER-2 negative ratio 1.16, Ki-67 2%, T1 BN 0 stage IA      01/19/2016 - 03/07/2016 Radiation Therapy    Adjuvant radiation ([place]):1) Right breast: 50.4 Gy in 28 fractions.  2) Right breast boost: 10 Gy in 5 fractions.      03/12/2016 -  Anti-estrogen oral therapy    Tamoxifen 44m daily.  Planned treatment duration: 5 years.       CHIEF COMPLIANT: Follow-up on tamoxifen therapy  INTERVAL HISTORY: LTRITIA ENDOis a 52year old with above-mentioned history of right breast cancer who is currently on tamoxifen and appears to be tolerating it fairly well.  She had a vaginal dryness but otherwise without  any complaints or concerns.  REVIEW OF SYSTEMS:   Constitutional: Denies fevers, chills or abnormal weight loss Eyes: Denies blurriness of vision Ears, nose, mouth, throat, and face: Denies mucositis or sore throat Respiratory: Denies cough, dyspnea or wheezes Cardiovascular: Denies palpitation, chest discomfort Gastrointestinal:  Denies nausea, heartburn or change in bowel habits Skin: Denies abnormal skin rashes Lymphatics: Denies new lymphadenopathy or easy bruising Neurological:Denies numbness, tingling or new weaknesses Behavioral/Psych: Mood is stable, no new changes  Extremities: No lower extremity edema Breast:  denies any pain or lumps or nodules in either breasts All other systems were reviewed with the patient and are negative.  I have reviewed the past medical history, past surgical history, social history and family history with the patient and they are unchanged from previous note.  ALLERGIES:  is allergic to no known allergies.  MEDICATIONS:  Current Outpatient Medications  Medication Sig Dispense Refill  . ALPRAZolam (XANAX) 0.5 MG tablet Take 0.5 mg by mouth daily as needed for anxiety.    . cephALEXin (KEFLEX) 500 MG capsule Take 1 capsule (500 mg total) by mouth 3 (three) times daily for 10 days. 30 capsule 0  . hydrocortisone 2.5 % ointment Apply topically 2 (two) times daily for 10 days. Apply to affected area twice daily. 28.35 g 0  . ibuprofen (ADVIL,MOTRIN) 200 MG tablet Take 400-800 mg by mouth every 6 (six) hours as needed for cramping.    . Levothyroxine Sodium 112 MCG CAPS Take 112 mcg by mouth daily before breakfast.     . loratadine (  CLARITIN) 10 MG tablet Take 10 mg by mouth daily.    . Melatonin 3 MG TABS Take 2 tablets by mouth at bedtime as needed (sleep).     . Multiple Vitamins-Minerals (MULTIVITAMIN PO) Take 1 tablet by mouth daily.    Marland Kitchen oxyCODONE-acetaminophen (PERCOCET/ROXICET) 5-325 MG tablet Take 1-2 tablets by mouth every 6 (six) hours as  needed for severe pain. (Patient not taking: Reported on 03/15/2016) 30 tablet 0  . propranolol ER (INDERAL LA) 60 MG 24 hr capsule Take 60 mg by mouth daily.    . tamoxifen (NOLVADEX) 20 MG tablet TAKE 1 TABLET BY MOUTH DAILY. 90 tablet 3  . VENLAFAXINE HCL PO Take 150 mg by mouth at bedtime.      No current facility-administered medications for this visit.     PHYSICAL EXAMINATION: ECOG PERFORMANCE STATUS: 1 - Symptomatic but completely ambulatory  Vitals:   04/29/17 1201  BP: 121/74  Pulse: 72  Resp: 18  Temp: 98.5 F (36.9 C)  SpO2: 97%   Filed Weights   04/29/17 1201  Weight: (!) 323 lb 8 oz (146.7 kg)    GENERAL:alert, no distress and comfortable SKIN: skin color, texture, turgor are normal, no rashes or significant lesions EYES: normal, Conjunctiva are pink and non-injected, sclera clear OROPHARYNX:no exudate, no erythema and lips, buccal mucosa, and tongue normal  NECK: supple, thyroid normal size, non-tender, without nodularity LYMPH:  no palpable lymphadenopathy in the cervical, axillary or inguinal LUNGS: clear to auscultation and percussion with normal breathing effort HEART: regular rate & rhythm and no murmurs and no lower extremity edema ABDOMEN:abdomen soft, non-tender and normal bowel sounds MUSCULOSKELETAL:no cyanosis of digits and no clubbing  NEURO: alert & oriented x 3 with fluent speech, no focal motor/sensory deficits EXTREMITIES: No lower extremity edema  LABORATORY DATA:  I have reviewed the data as listed CMP Latest Ref Rng & Units 12/08/2015 11/02/2015 09/07/2014  Glucose 65 - 99 mg/dL 103(H) 126 95  BUN 6 - 20 mg/dL 9 11.6 11  Creatinine 0.44 - 1.00 mg/dL 0.81 0.8 0.78  Sodium 135 - 145 mmol/L 135 139 139  Potassium 3.5 - 5.1 mmol/L 4.0 4.0 4.0  Chloride 101 - 111 mmol/L 102 - 109  CO2 22 - 32 mmol/L _0 Calcium 8.9 - 10.3 mg/dL 9.1 9.1 8.6(L)  Total Protein 6.4 - 8.3 g/dL - 7.6 -  Total Bilirubin 0.20 - 1.20 mg/dL - 0.44 -  Alkaline  Phos 40 - 150 U/L - 90 -  AST 5 - 34 U/L - 21 -  ALT 0 - 55 U/L - 26 -    Lab Results  Component Value Date   WBC 8.1 12/08/2015   HGB 13.9 12/08/2015   HCT 42.7 12/08/2015   MCV 86.8 12/08/2015   PLT 277 12/08/2015   NEUTROABS 4.6 11/02/2015    ASSESSMENT & PLAN:  Breast cancer of upper-inner quadrant of right female breast ([place]) Right lumpectomy 12/08/2015: IDC grade 1, 0.6 cm, DCIS with calcifications, intermediate grade, margins negative, 0/7 lymph nodes negative, ER 95%, PR 95%, HER-2 negative ratio 1.16, Ki-67 2%, T1 BN 0 stage IA Adjuvant radiation: 01/19/2016 to 03/07/2016  Treatment plan: Antiestrogen therapy with tamoxifen 20 mg daily started 04/12/2016 Tamoxifen toxicities: Intermittent hot flashes.  Return to clinic in 1 year  I spent 25 minutes talking to the patient of which more than half was spent in counseling and coordination of care.  No orders of the defined types were placed in this  encounter.  The patient has a good understanding of the overall plan. she agrees with it. she will call with any problems that may develop before the next visit here.   Harriette Ohara, MD 04/29/17

## 2017-05-08 ENCOUNTER — Ambulatory Visit: Payer: 59 | Admitting: Physical Therapy

## 2017-05-13 ENCOUNTER — Other Ambulatory Visit: Payer: Self-pay

## 2017-05-13 ENCOUNTER — Ambulatory Visit: Payer: 59 | Attending: Registered Nurse | Admitting: Physical Therapy

## 2017-05-13 DIAGNOSIS — M79601 Pain in right arm: Secondary | ICD-10-CM | POA: Insufficient documentation

## 2017-05-13 DIAGNOSIS — I890 Lymphedema, not elsewhere classified: Secondary | ICD-10-CM | POA: Diagnosis not present

## 2017-05-13 NOTE — Therapy (Signed)
[place], [place], [address] Phone: [phone]   Fax:  [phone]  Physical Therapy Evaluation  Patient Details  Name: Marie Wilson MRN: [id number] Date of Birth: December 26, 1965 Referring Provider: Dr. Deland Pretty   Encounter Date: 05/13/2017  PT End of Session - 05/13/17 1656    Visit Number  1    Number of Visits  3    Date for PT Re-Evaluation  06/18/17    PT Start Time  6063    PT Stop Time  1648    PT Time Calculation (min)  41 min    Activity Tolerance  Patient tolerated treatment well    Behavior During Therapy  [facility] for tasks assessed/performed       Past Medical History:  Diagnosis Date  . Anemia    one time  . Anxiety   . Breast cancer ([place])    right 2017  . Breast cancer of upper-inner quadrant of right female breast ([place]) 10/28/2015  . Cancer ([place])    skin  . Diabetes mellitus without complication ([place])    diet controlled- recent A1C=5  . Family history of breast cancer   . History of radiation therapy 01/19/16-03/07/16   right breast 50.4 Gy in 28 fractions, right breast boost 10 Gy in 5 fractions  . Hypertension    uses inderal for anxiety prevention which helps maintain BP  . Hypothyroidism   . Personal history of radiation therapy   . Pneumonia     Past Surgical History:  Procedure Laterality Date  . BARTHOLIN GLAND CYST EXCISION    . BREAST BIOPSY    . BREAST LUMPECTOMY     right 2017  . BREAST LUMPECTOMY WITH RADIOACTIVE SEED AND SENTINEL LYMPH NODE BIOPSY Right 12/08/2015   Procedure: BREAST LUMPECTOMY WITH RADIOACTIVE SEED AND SENTINEL LYMPH NODE BIOPSY; WITH LYMPHATIC MAPPING;  Surgeon: Erroll Luna, MD;  Location: [facility];  Service: General;  Laterality: Right;  . CESAREAN SECTION    . CHOLECYSTECTOMY    . ROBOTIC ASSISTED TOTAL HYSTERECTOMY Bilateral 09/08/2014   Procedure: ROBOTIC ASSISTED TOTAL HYSTERECTOMY WITH BILATERAL SALPINGECTOMY WITH LYSIS OF  ADHESIONS;  Surgeon: Princess Bruins, MD;  Location: [facility];  Service: Gynecology;  Laterality: Bilateral;  . skin growth      There were no vitals filed for this visit.   Subjective Assessment - 05/13/17 1611    Subjective  I had a lumpectomy and they ended up taking a lot of lymph nodes, though they all tested negative. Had radiation, and then around September or October I got some cellulitis at right scapula area; my arm started swellling and I had pain in a straight line all the way down to my hand. REsolved with antibiotics. Then got cellulitis again at right upper medial arm and it sent pain down her arm again.  "My hand and arm were noticeably larger than they were." That was about 3 weeks ago.    Pertinent History  Right lumpectomy and SLNB with 7 nodes removed 12/08/15 followed by radiation and tamoxifen. Right scapula area cellulitis and right arm swelling episode last fall, then another episode.more recently. Anxiety. Plantar fasciitis.    Patient Stated Goals  figure out, if this flares again, how to avoid this shooting, rocketing pain; wear my rings again    Currently in Pain?  No/denies         [facility] PT Assessment - 05/13/17 0001      Assessment   Medical  Diagnosis  Right arm lymphedema    Referring Provider  Dr. Deland Pretty    Onset Date/Surgical Date  12/08/15    Hand Dominance  Left    Prior Therapy  none      Precautions   Precautions  Other (comment)    Precaution Comments  cancer precautions      Restrictions   Weight Bearing Restrictions  No      Balance Screen   Has the patient fallen in the past 6 months  No    Has the patient had a decrease in activity level because of a fear of falling?   No    Is the patient reluctant to leave their home because of a fear of falling?   No      Home Environment   Living Environment  Private residence    Living Arrangements  Children 65 year-old son    Type of [place]  Two level      Prior  Function   Level of Independence  Independent    Vocation  Full time employment    Advice worker at [employer]  no regular exercise; some walking      Cognition   Overall Cognitive Status  Within Functional Limits for tasks assessed      Observation/Other Assessments   Observations  mild discoloration at anterior left elbow where pt. said she had an infection recently    Skin Integrity  approx. 3 inch long scar at anterior left shoulder from a possible skin cancer excision (which was negative)      AROM   Overall AROM Comments  both shoulders grossly WFL with mildly limited abduction no cording visible with ROM        LYMPHEDEMA/ONCOLOGY QUESTIONNAIRE - 05/13/17 1627      Right Upper Extremity Lymphedema   At Axilla   41.7 cm    15 cm Proximal to Olecranon Process  42.2 cm    10 cm Proximal to Olecranon Process  41.4 cm    Olecranon Process  31.4 cm    15 cm Proximal to Ulnar Styloid Process  31 cm    10 cm Proximal to Ulnar Styloid Process  27.8 cm    Just Proximal to Ulnar Styloid Process  19.9 cm    Across Hand at [employer]  20.2 cm    At [place] of 2nd Digit  6.6 cm      Left Upper Extremity Lymphedema   At Axilla   40.5 cm    15 cm Proximal to Olecranon Process  42.6 cm    10 cm Proximal to Olecranon Process  41.7 cm    Olecranon Process  30.2 cm    15 cm Proximal to Ulnar Styloid Process  30.3 cm    10 cm Proximal to Ulnar Styloid Process  27.5 cm    Just Proximal to Ulnar Styloid Process  18.5 cm    Across Hand at [employer]  19.3 cm    At [place] of 2nd Digit  6.4 cm           No data recorded  Objective measurements completed on examination: See above findings.              PT Education - 05/13/17 1655    Education provided  Yes    Education Details  Suggested that patient continue shoulder ROM stretches from [facility]; that  she wear the compression sleeve and glove she is to be fitted for tomorrow every day for  30 days and then as needed after that, particularly if she has a flareup; that she consider getting a nighttime compression garment such as a Deloria Lair) Educated  Patient    Methods  Explanation    Comprehension  Verbalized understanding          PT Long Term Goals - 05/13/17 1700      PT LONG TERM GOAL #1   Title  Pt. will be independent in manual lymph drainage for right UE.    Time  2    Period  Weeks    Status  New      PT LONG TERM GOAL #2   Title  Pt. will have ordered a compression sleeve and glove and understand suggestions for when to wear these.    Time  2    Period  Weeks    Status  New        Long Term Clinic Goals - 11/29/16 1036      CC Long Term Goal  #1   Title  Patient will report >/= 25% less pain in right arm to tolerate work and home tasks with greater ease.    Time  4    Period  Weeks    Status  New    Target Date  12/27/16      CC Long Term Goal  #2   Title  Patient will verbalize understanding of where and how to get compression sleeve and glove and the importance of wearing it regularly for at least 4 weeks to reduce symptoms.    Time  4    Period  Weeks    Status  New    Target Date  12/27/16      CC Long Term Goal  #3   Title  Increase right shoulder flexion to >/= 145 for increased ease reaching.    Time  4    Period  Weeks    Status  New    Target Date  12/27/16      CC Long Term Goal  #4   Title  Increase right shoulder abduction to >/= 155 for increased ease reaching.    Time  4    Period  Weeks    Status  New    Target Date  12/27/16      CC Long Term Goal  #5   Title  Patient will report >/= 25% improvement in feeling of fullness and edema in right arm and hand.    Time  4    Period  Weeks    Status  New          Plan - 05/13/17 1657    Clinical Impression Statement  Pleasant woman who has now had 2 episodes of cellulitis with accompanying swelling of right UE and pain going all the way down that right arm.   She has not seen a cord in that arm, but she describes feeling like it was a cord inside, so that may explain the pain. She has recovered to some extent from the second episode, but her arm measurements show some increase at right hand and wrist.     Clinical Decision Making  Low    Rehab Potential  Excellent    PT Frequency  1x / week    PT Duration  2 weeks    PT Treatment/Interventions  ADLs/Self Care Home Management;DME Instruction;Therapeutic exercise;Patient/family education;Orthotic Fit/Training;Manual techniques;Manual lymph drainage;Passive range of motion    PT Next Visit Plan  Assess garments if patient obtained them.  Teach manual lymph drainage for right UE on first visit; review and correct on second visit.    Consulted and Agree with Plan of Care  Patient       Patient will benefit from skilled therapeutic intervention in order to improve the following deficits and impairments:  Pain, Obesity, Decreased scar mobility, Decreased knowledge of precautions, Increased edema, Increased fascial restricitons, Impaired UE functional use, Decreased knowledge of use of DME, Decreased range of motion, Postural dysfunction  Visit Diagnosis: Pain in right arm - Plan: PT plan of care cert/re-cert  Lymphedema, not elsewhere classified - Plan: PT plan of care cert/re-cert     Problem List Patient Active Problem List   Diagnosis Date Noted  . Genetic testing 11/22/2015  . Family history of breast cancer   . Breast cancer of upper-inner quadrant of right female breast ([place]) 10/28/2015  . Postoperative state 09/08/2014    SALISBURY,DONNA 05/13/2017, 5:06 PM  [place], [place], [address] Phone: [phone]   Fax:  [phone]  Name: TRACEE MCCREERY MRN: [id number] Date of Birth: September 27, 1965  Serafina Royals, PT 05/13/17 5:06 PM

## 2017-05-16 ENCOUNTER — Ambulatory Visit: Payer: 59

## 2017-05-17 ENCOUNTER — Other Ambulatory Visit: Payer: Self-pay

## 2017-05-17 MED ORDER — VENLAFAXINE HCL ER 75 MG PO CP24: 75.0000 mg | ORAL_CAPSULE | Freq: Two times a day (BID) | ORAL | 6 refills | Status: DC

## 2017-05-17 MED FILL — VENLAFAXINE HCL ER 75 MG CA: 75 | 30 days supply | Qty: 60 | Fill #0

## 2017-05-17 NOTE — Progress Notes (Signed)
Pt called to request the correct dose of her effexor. Pt states that she was told by Dr.Gudena to take 75mg  BID, instead of once a day. Pt did not have the correct directions on her prescription, and had currently ran out of pills. Confirmed and okay with Dr.Gudena to change prescription directions. Pt thankful for the call. Orders changed and sent to [place].

## 2017-05-22 ENCOUNTER — Ambulatory Visit: Payer: 59 | Attending: Registered Nurse

## 2017-05-22 DIAGNOSIS — I890 Lymphedema, not elsewhere classified: Secondary | ICD-10-CM | POA: Insufficient documentation

## 2017-05-22 DIAGNOSIS — M79601 Pain in right arm: Secondary | ICD-10-CM | POA: Insufficient documentation

## 2017-05-22 NOTE — Patient Instructions (Addendum)
Start with circles near neck, above collarbones 10 times each side.   Cancer Rehab [phone] Deep Effective Breath   Standing, sitting, or laying down, place both hands on the belly. Take a deep breath IN, expanding the belly; then breath OUT, contracting the belly. Repeat __5__ times. Do __2-3__ sessions per day and before your self massage.  http://gt2.exer.us/866   Copyright  VHI. All rights reserved.  Axilla to Axilla - Sweep   On uninvolved side make 5 circles in the armpit, then pump _5__ times from involved armpit across chest to uninvolved armpit, making a pathway. Do _1__ time per day.  Copyright  VHI. All rights reserved.  Axilla to Inguinal Nodes - Sweep   On involved side, make 5 circles at groin at panty line, then pump _5__ times from armpit along side of trunk to outer hip, making your other pathway. Do __1_ time per day.  Copyright  VHI. All rights reserved.  Arm Posterior: Elbow to Shoulder - Sweep   Pump _5__ times from back of elbow to top of shoulder. Then inner to outer upper arm _5_ times, then outer arm again _5_ times. Then back to the pathways _2-3_ times. Do _1__ time per day.  Copyright  VHI. All rights reserved.  ARM: [place] Wrist to Elbow - Sweep   Pump or stationary circles _5__ times from wrist to elbow making sure to do both sides of the forearm. Then retrace your steps to the outer arm, and the pathways _2-3_ times each. Do _1__ time per day.  Copyright  VHI. All rights reserved.  ARM: Dorsum of Hand to Shoulder - Sweep   Pump or stationary circles _5__ times on back of hand including knuckle spaces and individual fingers if needed working up towards the wrist, then retrace all your steps working back up the forearm, doing both sides; upper outer arm and back to your pathways _2-3_ times each. Then do 5 circles again at uninvolved armpit and involved groin where you started! Good job!! Do __1_ time per day.  Copyright  VHI. All rights  reserved.

## 2017-05-22 NOTE — Therapy (Signed)
[place], [place], [address] Phone: [phone]   Fax:  [phone]  Physical Therapy Treatment  Patient Details  Name: Marie Wilson MRN: [id number] Date of Birth: [birth year] Referring Provider: Dr. Deland Pretty   Encounter Date: 05/22/2017  PT End of Session - 05/22/17 1605    Visit Number  2    Number of Visits  3    Date for PT Re-Evaluation  06/18/17    PT Start Time  6962    PT Stop Time  1604    PT Time Calculation (min)  48 min    Activity Tolerance  Patient tolerated treatment well    Behavior During Therapy  [facility] for tasks assessed/performed       Past Medical History:  Diagnosis Date  . Anemia    one time  . Anxiety   . Breast cancer ([place])    right 2017  . Breast cancer of upper-inner quadrant of right female breast ([place]) 10/28/2015  . Cancer ([place])    skin  . Diabetes mellitus without complication ([place])    diet controlled- recent A1C=5  . Family history of breast cancer   . History of radiation therapy 01/19/16-03/07/16   right breast 50.4 Gy in 28 fractions, right breast boost 10 Gy in 5 fractions  . Hypertension    uses inderal for anxiety prevention which helps maintain BP  . Hypothyroidism   . Personal history of radiation therapy   . Pneumonia     Past Surgical History:  Procedure Laterality Date  . BARTHOLIN GLAND CYST EXCISION    . BREAST BIOPSY    . BREAST LUMPECTOMY     right 2017  . BREAST LUMPECTOMY WITH RADIOACTIVE SEED AND SENTINEL LYMPH NODE BIOPSY Right 12/08/2015   Procedure: BREAST LUMPECTOMY WITH RADIOACTIVE SEED AND SENTINEL LYMPH NODE BIOPSY; WITH LYMPHATIC MAPPING;  Surgeon: Erroll Luna, MD;  Location: [place];  Service: General;  Laterality: Right;  . CESAREAN SECTION    . CHOLECYSTECTOMY    . ROBOTIC ASSISTED TOTAL HYSTERECTOMY Bilateral 09/08/2014   Procedure: ROBOTIC ASSISTED TOTAL HYSTERECTOMY WITH BILATERAL SALPINGECTOMY WITH LYSIS OF ADHESIONS;   Surgeon: Princess Bruins, MD;  Location: [facility];  Service: Gynecology;  Laterality: Bilateral;  . skin growth      There were no vitals filed for this visit.  Subjective Assessment - 05/22/17 1519    Subjective  The swelling has improved since last week as I continue to recover from the last bout of cellulitis. I got measured for my compression sleeve and glove last week and I just got the call today that they arrived so I'm going to pick those up.     Pertinent History  Right lumpectomy and SLNB with 7 nodes removed 12/08/15 followed by radiation and tamoxifen. Right scapula area cellulitis and right arm swelling episode last fall, then another episode.more recently. Anxiety. Plantar fasciitis.    Patient Stated Goals  figure out, if this flares again, how to avoid this shooting, rocketing pain; wear my rings again    Currently in Pain?  No/denies                       [facility] Adult PT Treatment/Exercise - 05/22/17 0001      Manual Therapy   Manual Therapy  Manual Lymphatic Drainage (MLD)    Manual Lymphatic Drainage (MLD)  In Supine: Short neck, 5 diaphragmatic breaths, Lt axilla nd Rt inguinal nodes, Rt axillo-inguinal  and anterior inter-axillary anstomosis, then Rt UE from dorsal hand to lateral shoulder working from proximal to distal then retracing all steps instructing pt throughout and having her perform at each sequence.             PT Education - 05/22/17 1604    Education provided  Yes    Education Details  Self manual lymph drainage    Person(s) Educated  Patient    Methods  Explanation;Demonstration;Handout;Tactile cues    Comprehension  Verbalized understanding;Returned demonstration;Need further instruction          PT Long Term Goals - 05/13/17 1700      PT LONG TERM GOAL #1   Title  Pt. will be independent in manual lymph drainage for right UE.    Time  2    Period  Weeks    Status  New      PT LONG TERM GOAL #2   Title  Pt. will have  ordered a compression sleeve and glove and understand suggestions for when to wear these.    Time  2    Period  Weeks    Status  New        Long Term Clinic Goals - 11/29/16 1036      CC Long Term Goal  #1   Title  Patient will report >/= 25% less pain in right arm to tolerate work and home tasks with greater ease.    Time  4    Period  Weeks    Status  New    Target Date  12/27/16      CC Long Term Goal  #2   Title  Patient will verbalize understanding of where and how to get compression sleeve and glove and the importance of wearing it regularly for at least 4 weeks to reduce symptoms.    Time  4    Period  Weeks    Status  New    Target Date  12/27/16      CC Long Term Goal  #3   Title  Increase right shoulder flexion to >/= 145 for increased ease reaching.    Time  4    Period  Weeks    Status  New    Target Date  12/27/16      CC Long Term Goal  #4   Title  Increase right shoulder abduction to >/= 155 for increased ease reaching.    Time  4    Period  Weeks    Status  New    Target Date  12/27/16      CC Long Term Goal  #5   Title  Patient will report >/= 25% improvement in feeling of fullness and edema in right arm and hand.    Time  4    Period  Weeks    Status  New         Plan - 05/22/17 1605    Clinical Impression Statement  Instructed pt in manual lymph drainage (MLD) today including basics of anatomy of lymphatic system and principles of MLD. Perofrmed sequence on her then had her return final retracing of all steps offering VCs and tactile hand over hand instruction throughout. Initially pt was having erythema response due to increased pressure but this was improved by no redness by end of instruction. Pt was also able to vrebalize feeling the lighter pressure. She returns for at least one more visit next week for final review per POC.  Rehab Potential  Excellent    Clinical Impairments Affecting Rehab Potential  None    PT Frequency  1x / week     PT Duration  2 weeks    PT Treatment/Interventions  ADLs/Self Care Home Management;DME Instruction;Therapeutic exercise;Patient/family education;Orthotic Fit/Training;Manual techniques;Manual lymph drainage;Passive range of motion    PT Next Visit Plan  Assess garments if patient obtained them.  Review manual lymph drainage for right UE and plan to D/C per POC.     PT Home Exercise Plan  Self MLD    Consulted and Agree with Plan of Care  Patient       Patient will benefit from skilled therapeutic intervention in order to improve the following deficits and impairments:  Pain, Obesity, Decreased scar mobility, Decreased knowledge of precautions, Increased edema, Increased fascial restricitons, Impaired UE functional use, Decreased knowledge of use of DME, Decreased range of motion, Postural dysfunction  Visit Diagnosis: Pain in right arm  Lymphedema, not elsewhere classified     Problem List Patient Active Problem List   Diagnosis Date Noted  . Genetic testing 11/22/2015  . Family history of breast cancer   . Breast cancer of upper-inner quadrant of right female breast ([place]) 10/28/2015  . Postoperative state 09/08/2014    Otelia Limes, PTA 05/22/2017, 4:09 PM  [place], [place], [address] Phone: [phone]   Fax:  [phone]  Name: ALIAHNA STATZER MRN: [id number] Date of Birth: September 27, 1965

## 2017-05-23 ENCOUNTER — Encounter: Payer: 59 | Admitting: Physical Therapy

## 2017-05-29 ENCOUNTER — Ambulatory Visit: Payer: 59

## 2017-05-29 DIAGNOSIS — I890 Lymphedema, not elsewhere classified: Secondary | ICD-10-CM

## 2017-05-29 DIAGNOSIS — M79601 Pain in right arm: Secondary | ICD-10-CM | POA: Diagnosis not present

## 2017-05-29 NOTE — Therapy (Addendum)
[place], [place], [address] Phone: [phone]   Fax:  [phone]  Physical Therapy Treatment  Patient Details  Name: Marie Wilson MRN: [id number] Date of Birth: 05-19-1965 Referring Provider: Dr. Deland Pretty   Encounter Date: 05/29/2017    Past Medical History:  Diagnosis Date  . Anemia    one time  . Anxiety   . Breast cancer ([place])    right 2017  . Breast cancer of upper-inner quadrant of right female breast ([place]) 10/28/2015  . Cancer ([place])    skin  . Diabetes mellitus without complication ([place])    diet controlled- recent A1C=5  . Family history of breast cancer   . History of radiation therapy 01/19/16-03/07/16   right breast 50.4 Gy in 28 fractions, right breast boost 10 Gy in 5 fractions  . Hypertension    uses inderal for anxiety prevention which helps maintain BP  . Hypothyroidism   . Personal history of radiation therapy   . Pneumonia     Past Surgical History:  Procedure Laterality Date  . BARTHOLIN GLAND CYST EXCISION    . BREAST BIOPSY    . BREAST LUMPECTOMY     right 2017  . BREAST LUMPECTOMY WITH RADIOACTIVE SEED AND SENTINEL LYMPH NODE BIOPSY Right 12/08/2015   Procedure: BREAST LUMPECTOMY WITH RADIOACTIVE SEED AND SENTINEL LYMPH NODE BIOPSY; WITH LYMPHATIC MAPPING;  Surgeon: Erroll Luna, MD;  Location: [place];  Service: General;  Laterality: Right;  . CESAREAN SECTION    . CHOLECYSTECTOMY    . ROBOTIC ASSISTED TOTAL HYSTERECTOMY Bilateral 09/08/2014   Procedure: ROBOTIC ASSISTED TOTAL HYSTERECTOMY WITH BILATERAL SALPINGECTOMY WITH LYSIS OF ADHESIONS;  Surgeon: Princess Bruins, MD;  Location: [facility];  Service: Gynecology;  Laterality: Bilateral;  . skin growth      There were no vitals filed for this visit.  Subjective Assessment - 05/29/17 0813    Subjective  I've been doing the self manual lymph drainage and I know I just need more practice. I went ot get my  sleeve after last visit and it was too short so she reordered it.     Pertinent History  Right lumpectomy and SLNB with 7 nodes removed 12/08/15 followed by radiation and tamoxifen. Right scapula area cellulitis and right arm swelling episode last fall, then another episode.more recently. Anxiety. Plantar fasciitis.    Patient Stated Goals  figure out, if this flares again, how to avoid this shooting, rocketing pain; wear my rings again    Currently in Pain?  No/denies            LYMPHEDEMA/ONCOLOGY QUESTIONNAIRE - 05/29/17 0849      Right Upper Extremity Lymphedema   At Axilla   40.7 cm    15 cm Proximal to Olecranon Process  40.5 cm    10 cm Proximal to Olecranon Process  40.6 cm    Olecranon Process  31.1 cm    15 cm Proximal to Ulnar Styloid Process  30.8 cm    10 cm Proximal to Ulnar Styloid Process  27.8 cm    Just Proximal to Ulnar Styloid Process  19.3 cm    Across Hand at [employer]  19.8 cm    At [place] of 2nd Digit  6.5 cm                [facility] Adult PT Treatment/Exercise - 05/29/17 0001      Manual Therapy   Manual Therapy  Manual Lymphatic  Drainage (MLD)    Manual Lymphatic Drainage (MLD)  In Supine: Short neck, 5 diaphragmatic breaths, Lt axilla nd Rt inguinal nodes, Rt axillo-inguinal and anterior inter-axillary anstomosis, then Rt UE from dorsal hand to lateral shoulder working from proximal to distal then retracing all steps instructing pt throughout and having her perform at each sequence.                  PT Long Term Goals - 05/29/17 0900      PT LONG TERM GOAL #1   Title  Pt. will be independent in manual lymph drainage for right UE.    Baseline  Pt was able to return good demonstration of this with min tactile and VCs for correct pressure-05/29/17    Status  Achieved      PT LONG TERM GOAL #2   Title  Pt. will have ordered a compression sleeve and glove and understand suggestions for when to wear these.    Baseline  This had  arrived but was too short, new one to arrive soon and instructed pt in frequency of wear-05/29/17    [facility] Goals - 11/29/16 1036      CC Long Term Goal  #1   Title  Patient will report >/= 25% less pain in right arm to tolerate work and home tasks with greater ease.    Time  4    Period  Weeks    Status  New    Target Date  12/27/16      CC Long Term Goal  #2   Title  Patient will verbalize understanding of where and how to get compression sleeve and glove and the importance of wearing it regularly for at least 4 weeks to reduce symptoms.    Time  4    Period  Weeks    Status  New    Target Date  12/27/16      CC Long Term Goal  #3   Title  Increase right shoulder flexion to >/= 145 for increased ease reaching.    Time  4    Period  Weeks    Status  New    Target Date  12/27/16      CC Long Term Goal  #4   Title  Increase right shoulder abduction to >/= 155 for increased ease reaching.    Time  4    Period  Weeks    Status  New    Target Date  12/27/16      CC Long Term Goal  #5   Title  Patient will report >/= 25% improvement in feeling of fullness and edema in right arm and hand.    Time  4    Period  Weeks    Status  New         Plan - 05/29/17 0901    Clinical Impression Statement  Reviewed with pt self manual lymph drainage (MLD) which she was able to return fairly good demonstration of. She did still require some tactile and VCs for correct pressure but as she has been practicing at home she was able to make corrections easily and feel the differences in pressure from what we did today in comparison to how she had been doing it at home. Remeasured her circumference and it had reduced well in most of arm which was encouraging to pt.  Also, she went to get her compression  garments after last session but sleeve was too short so it was reordered. Instructed pt best case is for her to wear sleeve every day but if she has days she isn't  as active and wants to not wear all day this might be okay, but should definitley make sure to do self MLD that day. She verbalized understanding this and plans to wear garments frequently. Pt has met her goals and is ready for D/C at this time.    Rehab Potential  Excellent    Clinical Impairments Affecting Rehab Potential  None    PT Frequency  1x / week    PT Duration  2 weeks    PT Treatment/Interventions  ADLs/Self Care Home Management;DME Instruction;Therapeutic exercise;Patient/family education;Orthotic Fit/Training;Manual techniques;Manual lymph drainage;Passive range of motion    PT Next Visit Plan  D/C this visit.    PT Home Exercise Plan  Self MLD    Consulted and Agree with Plan of Care  Patient       Patient will benefit from skilled therapeutic intervention in order to improve the following deficits and impairments:  Pain, Obesity, Decreased scar mobility, Decreased knowledge of precautions, Increased edema, Increased fascial restricitons, Impaired UE functional use, Decreased knowledge of use of DME, Decreased range of motion, Postural dysfunction  Visit Diagnosis: Pain in right arm  Lymphedema, not elsewhere classified     Problem List Patient Active Problem List   Diagnosis Date Noted  . Genetic testing 11/22/2015  . Family history of breast cancer   . Breast cancer of upper-inner quadrant of right female breast ([place]) 10/28/2015  . Postoperative state 09/08/2014    Otelia Limes, PTA 05/29/2017, 9:08 AM  [place], [place], [address] Phone: [phone]   Fax:  [phone]  Name: Marie Wilson MRN: [id number] Date of Birth: [birth year]  PHYSICAL THERAPY DISCHARGE SUMMARY  Visits from Start of Care: 2  Current functional level related to goals / functional outcomes: Goals met as noted above.   Remaining deficits: None   Education / Equipment: Self-manual lymph  drainage Plan: Patient agrees to discharge.  Patient goals were met. Patient is being discharged due to meeting the stated rehab goals.  ?????    Serafina Royals, PT 05/29/17 1:37 PM

## 2017-06-10 MED FILL — SYNTHROID 112 MCG TABLET: 112 | 90 days supply | Qty: 90 | Fill #1

## 2017-06-25 MED FILL — VENLAFAXINE HCL ER 75 MG CA: 75 | 30 days supply | Qty: 60 | Fill #1

## 2017-07-10 MED FILL — TAMOXIFEN CITRATE 20 MG TAB: 20 | 90 days supply | Qty: 90 | Fill #2

## 2017-07-18 MED FILL — PROPRANOLOL ER 60 MG CAP: 60 | 30 days supply | Qty: 30 | Fill #0

## 2017-07-18 MED FILL — ALPRAZolam 0.5 MG TABS: 0.5 | 15 days supply | Qty: 30 | Fill #0

## 2017-07-22 DIAGNOSIS — F411 Generalized anxiety disorder: Secondary | ICD-10-CM | POA: Diagnosis not present

## 2017-07-22 DIAGNOSIS — F330 Major depressive disorder, recurrent, mild: Secondary | ICD-10-CM | POA: Diagnosis not present

## 2017-07-31 MED FILL — ALPRAZolam 0.5 MG TABS: 0.5 | 30 days supply | Qty: 60 | Fill #0

## 2017-08-13 MED FILL — PROPRANOLOL ER 60 MG CAP: 60 | 90 days supply | Qty: 90 | Fill #0

## 2017-09-10 MED FILL — SYNTHROID 112 MCG TABLET: 112 | 90 days supply | Qty: 90 | Fill #0

## 2017-09-10 MED FILL — VENLAFAXINE HCL ER 75 MG CA: 75 | 30 days supply | Qty: 60 | Fill #2

## 2017-10-23 DIAGNOSIS — E119 Type 2 diabetes mellitus without complications: Secondary | ICD-10-CM | POA: Diagnosis not present

## 2017-10-23 DIAGNOSIS — E039 Hypothyroidism, unspecified: Secondary | ICD-10-CM | POA: Diagnosis not present

## 2017-10-23 DIAGNOSIS — Z0000 Encounter for general adult medical examination without abnormal findings: Secondary | ICD-10-CM | POA: Diagnosis not present

## 2017-10-28 MED FILL — VENLAFAXINE HCL ER 150 MG C: 150 | 90 days supply | Qty: 90 | Fill #0

## 2017-10-29 MED FILL — TAMOXIFEN CITRATE 20 MG TAB: 20 | 90 days supply | Qty: 90 | Fill #3

## 2017-11-26 ENCOUNTER — Other Ambulatory Visit: Payer: Self-pay | Admitting: Hematology and Oncology

## 2017-11-26 ENCOUNTER — Other Ambulatory Visit: Payer: Self-pay | Admitting: Adult Health

## 2017-11-26 DIAGNOSIS — Z9889 Other specified postprocedural states: Secondary | ICD-10-CM

## 2017-11-28 ENCOUNTER — Ambulatory Visit
Admission: RE | Admit: 2017-11-28 | Discharge: 2017-11-28 | Disposition: A | Discharge status: Home / Self Care | Payer: 59 | Source: Ambulatory Visit | Attending: Hematology and Oncology | Admitting: Hematology and Oncology

## 2017-11-28 DIAGNOSIS — Z853 Personal history of malignant neoplasm of breast: Secondary | ICD-10-CM | POA: Diagnosis not present

## 2017-11-28 DIAGNOSIS — R928 Other abnormal and inconclusive findings on diagnostic imaging of breast: Secondary | ICD-10-CM | POA: Diagnosis not present

## 2017-11-28 DIAGNOSIS — Z9889 Other specified postprocedural states: Secondary | ICD-10-CM

## 2018-01-06 MED FILL — ALPRAZolam 0.5 MG TABS: 0.5 | 30 days supply | Qty: 60 | Fill #0

## 2018-01-14 MED FILL — PROPRANOLOL ER 60 MG CAP: 60 | 90 days supply | Qty: 90 | Fill #0

## 2018-01-22 DIAGNOSIS — F330 Major depressive disorder, recurrent, mild: Secondary | ICD-10-CM | POA: Diagnosis not present

## 2018-01-22 DIAGNOSIS — F411 Generalized anxiety disorder: Secondary | ICD-10-CM | POA: Diagnosis not present

## 2018-01-23 MED FILL — SYNTHROID 112 MCG TABLET: 112 | 90 days supply | Qty: 90 | Fill #1

## 2018-02-10 MED FILL — VENLAFAXINE HCL ER 150 MG C: 150 | 90 days supply | Qty: 90 | Fill #1

## 2018-02-19 HISTORY — PX: BREAST BIOPSY: SHX20

## 2018-04-03 MED FILL — TAMOXIFEN CITRATE 20 MG TAB: 20 | 90 days supply | Qty: 90 | Fill #0

## 2018-04-28 ENCOUNTER — Inpatient Hospital Stay: Payer: 59 | Attending: Hematology and Oncology | Admitting: Hematology and Oncology

## 2018-04-28 NOTE — Assessment & Plan Note (Signed)
Right lumpectomy 12/08/2015: IDC grade 1, 0.6 cm, DCIS with calcifications, intermediate grade, margins negative, 0/7 lymph nodes negative, ER 95%, PR 95%, HER-2 negative ratio 1.16, Ki-67 2%, T1 BN 0 stage IA Adjuvant radiation: 11/30/2017to01/17/2018  Treatment plan:Antiestrogen therapy with tamoxifen 20 mg daily started 04/12/2016 Tamoxifen toxicities: Intermittent hot flashes.  Breast cancer surveillance: 1.  11/28/2017: Mammogram, breast density category B, benign 2.  04/28/2018: Breast exam: Benign   Return to clinic in 1 year

## 2018-04-29 MED FILL — PROPRANOLOL ER 60 MG CAP: 60 | 90 days supply | Qty: 90 | Fill #0

## 2018-05-06 DIAGNOSIS — E785 Hyperlipidemia, unspecified: Secondary | ICD-10-CM | POA: Diagnosis not present

## 2018-05-06 DIAGNOSIS — E039 Hypothyroidism, unspecified: Secondary | ICD-10-CM | POA: Diagnosis not present

## 2018-05-06 DIAGNOSIS — Z0000 Encounter for general adult medical examination without abnormal findings: Secondary | ICD-10-CM | POA: Diagnosis not present

## 2018-05-06 DIAGNOSIS — E119 Type 2 diabetes mellitus without complications: Secondary | ICD-10-CM | POA: Diagnosis not present

## 2018-05-07 MED FILL — SYNTHROID 112 MCG TABLET: 112 | 30 days supply | Qty: 30 | Fill #0

## 2018-05-23 MED FILL — VENLAFAXINE HCL ER 150 MG C: 150 | 90 days supply | Qty: 90 | Fill #0

## 2018-06-10 MED FILL — SYNTHROID 112 MCG TABLET: 112 | 30 days supply | Qty: 30 | Fill #1

## 2018-06-27 MED FILL — CLINDAMYCIN HCL 150 MG CAPS: 150 | 10 days supply | Qty: 30 | Fill #0

## 2018-07-17 MED FILL — SYNTHROID 112 MCG TABLET: 112 | 30 days supply | Qty: 30 | Fill #0

## 2018-07-23 ENCOUNTER — Other Ambulatory Visit: Payer: Self-pay | Admitting: Hematology and Oncology

## 2018-07-23 MED FILL — TAMOXIFEN CITRATE 20 MG TAB: 20 | 90 days supply | Qty: 90 | Fill #0

## 2018-07-24 DIAGNOSIS — F411 Generalized anxiety disorder: Secondary | ICD-10-CM | POA: Diagnosis not present

## 2018-07-24 DIAGNOSIS — F330 Major depressive disorder, recurrent, mild: Secondary | ICD-10-CM | POA: Diagnosis not present

## 2018-07-24 MED FILL — PROPRANOLOL HCL ER 60 MG CP: 60 | 90 days supply | Qty: 90 | Fill #0

## 2018-08-29 MED FILL — SYNTHROID 112 MCG TABLET: 112 | 90 days supply | Qty: 90 | Fill #0

## 2018-09-25 DIAGNOSIS — H5213 Myopia, bilateral: Secondary | ICD-10-CM | POA: Diagnosis not present

## 2018-10-20 MED FILL — VENLAFAXINE HCL ER 150 MG C: 150 | 90 days supply | Qty: 90 | Fill #1

## 2018-10-22 ENCOUNTER — Other Ambulatory Visit: Payer: Self-pay | Admitting: Hematology and Oncology

## 2018-10-22 DIAGNOSIS — Z9889 Other specified postprocedural states: Secondary | ICD-10-CM

## 2018-10-30 MED FILL — AMOXICILLIN 500 MG CAPSULE: 500 | 7 days supply | Qty: 25 | Fill #0

## 2018-10-30 MED FILL — IBUPROFEN 800 MG TAB: 800 | 9 days supply | Qty: 28 | Fill #0

## 2018-11-06 MED FILL — TAMOXIFEN 20 MG TABLET: 20 | 90 days supply | Qty: 90 | Fill #1

## 2018-11-11 MED FILL — PROPRANOLOL HCL ER 60 MG CP: 60 | 90 days supply | Qty: 90 | Fill #1

## 2018-12-01 MED FILL — SYNTHROID 112 MCG TABLET: 112 | 90 days supply | Qty: 90 | Fill #1

## 2018-12-02 ENCOUNTER — Other Ambulatory Visit: Payer: Self-pay

## 2018-12-02 ENCOUNTER — Other Ambulatory Visit: Payer: Self-pay | Admitting: Hematology and Oncology

## 2018-12-02 ENCOUNTER — Ambulatory Visit
Admission: RE | Admit: 2018-12-02 | Discharge: 2018-12-02 | Disposition: A | Discharge status: Home / Self Care | Payer: 59 | Source: Ambulatory Visit | Attending: Hematology and Oncology | Admitting: Hematology and Oncology

## 2018-12-02 DIAGNOSIS — R921 Mammographic calcification found on diagnostic imaging of breast: Secondary | ICD-10-CM | POA: Diagnosis not present

## 2018-12-02 DIAGNOSIS — Z9889 Other specified postprocedural states: Secondary | ICD-10-CM

## 2018-12-05 ENCOUNTER — Ambulatory Visit
Admission: RE | Admit: 2018-12-05 | Discharge: 2018-12-05 | Disposition: A | Discharge status: Home / Self Care | Payer: 59 | Source: Ambulatory Visit | Attending: Hematology and Oncology | Admitting: Hematology and Oncology

## 2018-12-05 ENCOUNTER — Other Ambulatory Visit: Payer: Self-pay

## 2018-12-05 DIAGNOSIS — D0511 Intraductal carcinoma in situ of right breast: Secondary | ICD-10-CM | POA: Diagnosis not present

## 2018-12-05 DIAGNOSIS — R921 Mammographic calcification found on diagnostic imaging of breast: Secondary | ICD-10-CM | POA: Diagnosis not present

## 2018-12-05 DIAGNOSIS — C50211 Malignant neoplasm of upper-inner quadrant of right female breast: Secondary | ICD-10-CM | POA: Diagnosis not present

## 2018-12-05 DIAGNOSIS — N6011 Diffuse cystic mastopathy of right breast: Secondary | ICD-10-CM | POA: Diagnosis not present

## 2019-01-22 DIAGNOSIS — F330 Major depressive disorder, recurrent, mild: Secondary | ICD-10-CM | POA: Diagnosis not present

## 2019-01-22 DIAGNOSIS — F411 Generalized anxiety disorder: Secondary | ICD-10-CM | POA: Diagnosis not present

## 2019-01-24 MED FILL — VENLAFAXINE HCL ER 150 MG C: 150 | 90 days supply | Qty: 90 | Fill #0

## 2019-01-24 MED FILL — PROPRANOLOL HCL ER 60 MG CP: 60 | 90 days supply | Qty: 90 | Fill #0

## 2019-02-18 ENCOUNTER — Telehealth: Payer: Self-pay | Admitting: Critical Care Medicine

## 2019-02-18 ENCOUNTER — Other Ambulatory Visit: Payer: Self-pay | Admitting: Critical Care Medicine

## 2019-02-18 DIAGNOSIS — Z6841 Body Mass Index (BMI) 40.0 and over, adult: Secondary | ICD-10-CM

## 2019-02-18 DIAGNOSIS — U071 COVID-19: Secondary | ICD-10-CM | POA: Insufficient documentation

## 2019-02-18 MED FILL — TAMOXIFEN 20 MG TABLET: 20 | 90 days supply | Qty: 90 | Fill #2

## 2019-02-18 NOTE — Progress Notes (Signed)
  I connected by phone with Marlane Mingle on 02/18/2019 at 4:51 PM to discuss the potential use of an new treatment for mild to moderate COVID-19 viral infection in non-hospitalized patients.  This patient is a 53 y.o. female that meets the FDA criteria for Emergency Use Authorization of casirivimab\imdevimab.  Has a (+) direct SARS-CoV-2 viral test result  Has mild or moderate COVID-19   Is ? 54 years of age and weighs ? 40 kg  Is NOT hospitalized due to COVID-19  Is NOT requiring oxygen therapy or requiring an increase in baseline oxygen flow rate due to COVID-19  Is within 10 days of symptom onset  Has at least one of the high risk factor(s) for progression to severe COVID-19 and/or hospitalization as defined in EUA.  Specific high risk criteria : BMI >/= 35   I have spoken and communicated the following to the patient or parent/caregiver:  1. FDA has authorized the emergency use of bamlanivimab and casirivimab\imdevimab for the treatment of mild to moderate COVID-19 in adults and pediatric patients with positive results of direct SARS-CoV-2 viral testing who are 38 years of age and older weighing at least 40 kg, and who are at high risk for progressing to severe COVID-19 and/or hospitalization.  2. The significant known and potential risks and benefits of bamlanivimab and casirivimab\imdevimab, and the extent to which such potential risks and benefits are unknown.  3. Information on available alternative treatments and the risks and benefits of those alternatives, including clinical trials.  4. Patients treated with bamlanivimab and casirivimab\imdevimab should continue to self-isolate and use infection control measures (e.g., wear mask, isolate, social distance, avoid sharing personal items, clean and disinfect "high touch" surfaces, and frequent handwashing) according to CDC guidelines.   5. The patient or parent/caregiver has the option to accept or refuse bamlanivimab or  casirivimab\imdevimab .  After reviewing this information with the patient, The patient agreed to proceed with receiving the casirivimab\imdevimab infusion and will be provided a copy of the Fact sheet prior to receiving the infusion.Asencion Noble 02/18/2019 4:51 PM

## 2019-02-18 NOTE — Telephone Encounter (Signed)
I connected this patient who is Covid positive and her test occurred on December 26 and that is when her onset of symptoms occurred.  She currently is having fever chills muscle aches cough fatigue and headaches   The patient is morbidly obese therefore falls in the high risk category for monoclonal antibody  Called to discuss with patient about Covid symptoms and the use of casrinivimab/imdevimab, a monoclonal antibody infusion for those with mild to moderate Covid symptoms and at a high risk of hospitalization.  Pt is qualified for this infusion at the [facility] infusion center due to BMI>35   The patient agrees to the infusion

## 2019-02-19 MED FILL — ALPRAZolam 0.5 MG TABS: 0.5 | 30 days supply | Qty: 60 | Fill #0

## 2019-02-21 ENCOUNTER — Inpatient Hospital Stay (HOSPITAL_COMMUNITY)
Admission: EM | Admit: 2019-02-21 | Discharge: 2019-02-23 | DRG: 177 | Disposition: A | Discharge status: Home / Self Care | Payer: No Typology Code available for payment source | Attending: Internal Medicine | Admitting: Internal Medicine

## 2019-02-21 ENCOUNTER — Emergency Department (HOSPITAL_COMMUNITY): Payer: No Typology Code available for payment source

## 2019-02-21 ENCOUNTER — Encounter (HOSPITAL_COMMUNITY): Payer: Self-pay

## 2019-02-21 ENCOUNTER — Other Ambulatory Visit: Payer: Self-pay

## 2019-02-21 DIAGNOSIS — Z170 Estrogen receptor positive status [ER+]: Secondary | ICD-10-CM | POA: Diagnosis not present

## 2019-02-21 DIAGNOSIS — E119 Type 2 diabetes mellitus without complications: Secondary | ICD-10-CM | POA: Diagnosis present

## 2019-02-21 DIAGNOSIS — Z6841 Body Mass Index (BMI) 40.0 and over, adult: Secondary | ICD-10-CM

## 2019-02-21 DIAGNOSIS — J9601 Acute respiratory failure with hypoxia: Secondary | ICD-10-CM | POA: Diagnosis present

## 2019-02-21 DIAGNOSIS — Z853 Personal history of malignant neoplasm of breast: Secondary | ICD-10-CM

## 2019-02-21 DIAGNOSIS — D849 Immunodeficiency, unspecified: Secondary | ICD-10-CM | POA: Diagnosis present

## 2019-02-21 DIAGNOSIS — E039 Hypothyroidism, unspecified: Secondary | ICD-10-CM | POA: Diagnosis present

## 2019-02-21 DIAGNOSIS — J9600 Acute respiratory failure, unspecified whether with hypoxia or hypercapnia: Secondary | ICD-10-CM

## 2019-02-21 DIAGNOSIS — F419 Anxiety disorder, unspecified: Secondary | ICD-10-CM | POA: Diagnosis present

## 2019-02-21 DIAGNOSIS — C50211 Malignant neoplasm of upper-inner quadrant of right female breast: Secondary | ICD-10-CM | POA: Diagnosis present

## 2019-02-21 DIAGNOSIS — Z79890 Hormone replacement therapy (postmenopausal): Secondary | ICD-10-CM | POA: Diagnosis not present

## 2019-02-21 DIAGNOSIS — J1282 Pneumonia due to coronavirus disease 2019: Secondary | ICD-10-CM | POA: Diagnosis present

## 2019-02-21 DIAGNOSIS — I10 Essential (primary) hypertension: Secondary | ICD-10-CM | POA: Diagnosis present

## 2019-02-21 DIAGNOSIS — Z90710 Acquired absence of both cervix and uterus: Secondary | ICD-10-CM

## 2019-02-21 DIAGNOSIS — U071 COVID-19: Principal | ICD-10-CM | POA: Diagnosis present

## 2019-02-21 DIAGNOSIS — E66813 Obesity, class 3: Secondary | ICD-10-CM

## 2019-02-21 DIAGNOSIS — F329 Major depressive disorder, single episode, unspecified: Secondary | ICD-10-CM | POA: Diagnosis present

## 2019-02-21 DIAGNOSIS — Z803 Family history of malignant neoplasm of breast: Secondary | ICD-10-CM

## 2019-02-21 DIAGNOSIS — Z79899 Other long term (current) drug therapy: Secondary | ICD-10-CM | POA: Diagnosis not present

## 2019-02-21 DIAGNOSIS — Z923 Personal history of irradiation: Secondary | ICD-10-CM

## 2019-02-21 LAB — COMPREHENSIVE METABOLIC PANEL
ALT: 49 U/L — ABNORMAL HIGH (ref 0–44)
AST: 61 U/L — ABNORMAL HIGH (ref 15–41)
Albumin: 3.2 g/dL — ABNORMAL LOW (ref 3.5–5.0)
Alkaline Phosphatase: 62 U/L (ref 38–126)
Anion gap: 10 (ref 5–15)
BUN: 10 mg/dL (ref 6–20)
CO2: 23 mmol/L (ref 22–32)
Calcium: 8.4 mg/dL — ABNORMAL LOW (ref 8.9–10.3)
Chloride: 103 mmol/L (ref 98–111)
Creatinine, Ser: 0.87 mg/dL (ref 0.44–1.00)
GFR calc Af Amer: >60 mL/min (ref >60)
GFR calc non Af Amer: >60 mL/min (ref >60)
Glucose, Bld: 113 mg/dL — ABNORMAL HIGH (ref 70–99)
Potassium: 3.6 mmol/L (ref 3.5–5.1)
Sodium: 136 mmol/L (ref 135–145)
Total Bilirubin: 0.4 mg/dL (ref 0.3–1.2)
Total Protein: 6.9 g/dL (ref 6.5–8.1)

## 2019-02-21 LAB — CBC WITH DIFFERENTIAL/PLATELET
Abs Immature Granulocytes: 0.02 10*3/uL (ref 0.00–0.07)
Basophils Absolute: 0 10*3/uL (ref 0.0–0.1)
Basophils Relative: 0 %
Eosinophils Absolute: 0 10*3/uL (ref 0.0–0.5)
Eosinophils Relative: 0 %
HCT: 42.7 % (ref 36.0–46.0)
Hemoglobin: 13.7 g/dL (ref 12.0–15.0)
Immature Granulocytes: 1 %
Lymphocytes Relative: 27 %
Lymphs Abs: 0.9 10*3/uL (ref 0.7–4.0)
MCH: 29.1 pg (ref 26.0–34.0)
MCHC: 32.1 g/dL (ref 30.0–36.0)
MCV: 90.9 fL (ref 80.0–100.0)
Monocytes Absolute: 0.3 10*3/uL (ref 0.1–1.0)
Monocytes Relative: 7 %
Neutro Abs: 2.3 10*3/uL (ref 1.7–7.7)
Neutrophils Relative %: 65 %
Platelets: 186 10*3/uL (ref 150–400)
RBC: 4.7 MIL/uL (ref 3.87–5.11)
RDW: 12.4 % (ref 11.5–15.5)
WBC: 3.5 10*3/uL — ABNORMAL LOW (ref 4.0–10.5)
nRBC: 0 % (ref 0.0–0.2)

## 2019-02-21 LAB — TRIGLYCERIDES: Triglycerides: 168 mg/dL — ABNORMAL HIGH (ref <150)

## 2019-02-21 LAB — C-REACTIVE PROTEIN: CRP: 1.5 mg/dL — ABNORMAL HIGH (ref <1.0)

## 2019-02-21 LAB — D-DIMER, QUANTITATIVE: D-Dimer, Quant: 0.79 ug/mL-FEU — ABNORMAL HIGH (ref 0.00–0.50)

## 2019-02-21 LAB — POC SARS CORONAVIRUS 2 AG -  ED: SARS Coronavirus 2 Ag: NEGATIVE

## 2019-02-21 LAB — FERRITIN: Ferritin: 508 ng/mL — ABNORMAL HIGH (ref 11–307)

## 2019-02-21 LAB — PROCALCITONIN: Procalcitonin: <0.1 ng/mL

## 2019-02-21 LAB — LACTATE DEHYDROGENASE: LDH: 227 U/L — ABNORMAL HIGH (ref 98–192)

## 2019-02-21 LAB — FIBRINOGEN: Fibrinogen: 444 mg/dL (ref 210–475)

## 2019-02-21 LAB — LACTIC ACID, PLASMA: Lactic Acid, Venous: 1.1 mmol/L (ref 0.5–1.9)

## 2019-02-21 MED ORDER — SODIUM CHLORIDE 0.9 % IV SOLN
200.0000 mg | Freq: Once | INTRAVENOUS | Status: AC
  Administered 2019-02-22: 200 mg via INTRAVENOUS
  Filled 2019-02-21: qty 40
  Filled 2019-02-21: qty 200

## 2019-02-21 MED ORDER — SODIUM CHLORIDE 0.9 % IV SOLN
1000.0000 mL | INTRAVENOUS | Status: DC
  Administered 2019-02-22: 1000 mL via INTRAVENOUS

## 2019-02-21 MED ORDER — DEXAMETHASONE 4 MG PO TABS
6.0000 mg | ORAL_TABLET | Freq: Every day | ORAL | Status: DC
  Administered 2019-02-22 (×2): 6 mg via ORAL
  Filled 2019-02-21: qty 1
  Filled 2019-02-21: qty 2

## 2019-02-21 MED ORDER — GUAIFENESIN-DM 100-10 MG/5ML PO SYRP: 10.0000 mL | ORAL_SOLUTION | ORAL | Status: DC | PRN

## 2019-02-21 MED ORDER — ONDANSETRON HCL 4 MG PO TABS: 4.0000 mg | ORAL_TABLET | Freq: Four times a day (QID) | ORAL | Status: DC | PRN

## 2019-02-21 MED ORDER — INSULIN ASPART 100 UNIT/ML ~~LOC~~ SOLN
0.0000 [IU] | Freq: Every day | SUBCUTANEOUS | Status: DC
  Filled 2019-02-21: qty 0.05

## 2019-02-21 MED ORDER — ONDANSETRON HCL 4 MG/2ML IJ SOLN: 4.0000 mg | Freq: Four times a day (QID) | INTRAMUSCULAR | Status: DC | PRN

## 2019-02-21 MED ORDER — INSULIN ASPART 100 UNIT/ML ~~LOC~~ SOLN
0.0000 [IU] | Freq: Three times a day (TID) | SUBCUTANEOUS | Status: DC
  Administered 2019-02-22 – 2019-02-23 (×4): 3 [IU] via SUBCUTANEOUS
  Filled 2019-02-21: qty 0.15

## 2019-02-21 MED ORDER — ACETAMINOPHEN 325 MG PO TABS
650.0000 mg | ORAL_TABLET | Freq: Four times a day (QID) | ORAL | Status: DC | PRN
  Administered 2019-02-22 (×2): 650 mg via ORAL
  Filled 2019-02-21 (×2): qty 2

## 2019-02-21 MED ORDER — ALBUTEROL SULFATE HFA 108 (90 BASE) MCG/ACT IN AERS
2.0000 | INHALATION_SPRAY | Freq: Four times a day (QID) | RESPIRATORY_TRACT | Status: DC
  Administered 2019-02-22 – 2019-02-23 (×6): 2 via RESPIRATORY_TRACT
  Filled 2019-02-21 (×2): qty 6.7

## 2019-02-21 MED ORDER — ENOXAPARIN SODIUM 40 MG/0.4ML ~~LOC~~ SOLN
40.0000 mg | Freq: Every day | SUBCUTANEOUS | Status: DC
  Administered 2019-02-22 (×2): 40 mg via SUBCUTANEOUS
  Filled 2019-02-21 (×2): qty 0.4

## 2019-02-21 MED ORDER — SODIUM CHLORIDE 0.9 % IV SOLN
100.0000 mg | Freq: Every day | INTRAVENOUS | Status: DC
  Administered 2019-02-22 – 2019-02-23 (×2): 100 mg via INTRAVENOUS
  Filled 2019-02-21 (×2): qty 100

## 2019-02-21 MED ORDER — HYDROCOD POLST-CPM POLST ER 10-8 MG/5ML PO SUER: 5.0000 mL | Freq: Two times a day (BID) | ORAL | Status: DC | PRN

## 2019-02-21 NOTE — ED Provider Notes (Signed)
[facility] DEPT Provider Note   CSN: [id number] Arrival date & time: 02/21/19  1751     History Chief Complaint  Patient presents with  . COVID+    Marie Wilson is a 54 y.o. female with a past medical history of breast cancer status post lumpectomy, radiation in 2017, anxiety, hypertension, obesity, status post hysterectomy who presents today for evaluation of generalized fatigue.  She reports that she tested positive for Covid on 12/26 and has been having worsening fatigue since.  She reports occasional nausea and vomiting however is generally able to keep down water.  Her cough has worsened.  She reports that she is still having fevers.  She has had about 3 episodes of diarrhea today.  She is scheduled for a Covid antibody infusion on Monday.  She has a pulse oximeter at home and reports that when she was checking it at home it was in the 80s.  She reports occasional headaches.  She states that she had discussed her hypoxia with her doctor and they recommended that she come to the emergency room for evaluation.  She denies any significant shortness of breath.  Over the past 3 days her symptoms overall is worsened, her cough has gone from being nonproductive to productive.  She denies any significant abdominal pain.   HPI     Past Medical History:  Diagnosis Date  . Anemia    one time  . Anxiety   . Breast cancer ([place])    right 2017  . Breast cancer of upper-inner quadrant of right female breast ([place]) 10/28/2015  . Cancer ([place])    skin  . Diabetes mellitus without complication ([place])    diet controlled- recent A1C=5  . Family history of breast cancer   . History of radiation therapy 01/19/16-03/07/16   right breast 50.4 Gy in 28 fractions, right breast boost 10 Gy in 5 fractions  . Hypertension    uses inderal for anxiety prevention which helps maintain BP  . Hypothyroidism   . Personal history of radiation therapy   . Pneumonia      Patient Active Problem List   Diagnosis Date Noted  . Class 3 severe obesity due to excess calories with serious comorbidity and body mass index (BMI) of 50.0 to 59.9 in adult ([place]) 02/18/2019  . COVID-19 virus infection 02/18/2019  . Genetic testing 11/22/2015  . Family history of breast cancer   . Breast cancer of upper-inner quadrant of right female breast ([place]) 10/28/2015  . Postoperative state 09/08/2014    Past Surgical History:  Procedure Laterality Date  . BARTHOLIN GLAND CYST EXCISION    . BREAST BIOPSY    . BREAST LUMPECTOMY Right    right 2017  . BREAST LUMPECTOMY WITH RADIOACTIVE SEED AND SENTINEL LYMPH NODE BIOPSY Right 12/08/2015   Procedure: BREAST LUMPECTOMY WITH RADIOACTIVE SEED AND SENTINEL LYMPH NODE BIOPSY; WITH LYMPHATIC MAPPING;  Surgeon: Erroll Luna, MD;  Location: [facility];  Service: General;  Laterality: Right;  . CESAREAN SECTION    . CHOLECYSTECTOMY    . ROBOTIC ASSISTED TOTAL HYSTERECTOMY Bilateral 09/08/2014   Procedure: ROBOTIC ASSISTED TOTAL HYSTERECTOMY WITH BILATERAL SALPINGECTOMY WITH LYSIS OF ADHESIONS;  Surgeon: Princess Bruins, MD;  Location: [facility];  Service: Gynecology;  Laterality: Bilateral;  . skin growth       OB History    Gravida  2   Para  1   Term  1   Preterm      AB  Living  1     SAB      TAB      Ectopic      Multiple      Live Births  1           Family History  Problem Relation Age of Onset  . Breast cancer Mother 37  . Breast cancer Sister 54  . Hodgkin's lymphoma Sister        dx in her 51s  . Lung cancer Maternal Uncle   . Lung cancer Paternal Uncle   . Breast cancer Cousin 34       maternal first cousin  . Thyroid cancer Cousin 10       maternal first cousin    Social History   Tobacco Use  . Smoking status: Never Smoker  . Smokeless tobacco: Never Used  Substance Use Topics  . Alcohol use: No  . Drug use: No    Home Medications Prior to Admission medications    Medication Sig Start Date End Date Taking? Authorizing Provider  ALPRAZolam Duanne Moron) 0.5 MG tablet Take 0.5 mg by mouth daily as needed for anxiety.    [provider]  ibuprofen (ADVIL,MOTRIN) 200 MG tablet Take 400-800 mg by mouth every 6 (six) hours as needed for cramping.    [provider]  Levothyroxine Sodium 112 MCG CAPS Take 112 mcg by mouth daily before breakfast.     [provider]  loratadine (CLARITIN) 10 MG tablet Take 10 mg by mouth daily.    [provider]  Multiple Vitamins-Minerals (MULTIVITAMIN PO) Take 1 tablet by mouth daily.    [provider]  propranolol ER (INDERAL LA) 60 MG 24 hr capsule Take 60 mg by mouth daily.    [provider]  tamoxifen (NOLVADEX) 20 MG tablet TAKE 1 TABLET BY MOUTH ONCE DAILY 07/23/18   Nicholas Lose, MD  venlafaxine XR (EFFEXOR-XR) 75 MG 24 hr capsule Take 1 capsule (75 mg total) by mouth 2 (two) times daily. 05/17/17   Nicholas Lose, MD    Allergies    No known allergies  Review of Systems   Review of Systems  Constitutional: Positive for chills, fatigue and fever.  HENT: Negative for congestion, sinus pressure and sneezing.   Eyes: Negative for visual disturbance.  Respiratory: Positive for cough. Negative for chest tightness and shortness of breath.   Cardiovascular: Negative for chest pain, palpitations and leg swelling.  Gastrointestinal: Positive for diarrhea, nausea and vomiting. Negative for abdominal pain.  Genitourinary: Negative for dysuria.  Musculoskeletal: Positive for arthralgias and myalgias. Negative for back pain.  Skin: Negative for color change and rash.  Neurological: Positive for headaches (Occasionally). Negative for weakness.  Psychiatric/Behavioral: Negative for confusion.  All other systems reviewed and are negative.   Physical Exam Updated Vital Signs BP 131/82 (BP Location: Left Wrist) Comment: Simultaneous filing. User may not have seen previous data.   Pulse 85   Temp 99.2 F (37.3 C) (Oral)   Resp 12   LMP  (LMP Unknown)   SpO2 97%   Physical Exam Vitals and nursing note reviewed.  Constitutional:      General: She is not in acute distress.    Appearance: She is well-developed. She is obese. She is not diaphoretic.  HENT:     Head: Normocephalic and atraumatic.  Eyes:     General: No scleral icterus.       Right eye: No discharge.  Left eye: No discharge.     Conjunctiva/sclera: Conjunctivae normal.  Cardiovascular:     Rate and Rhythm: Normal rate and regular rhythm.     Pulses: Normal pulses.     Heart sounds: Normal heart sounds.  Pulmonary:     Effort: Pulmonary effort is normal. No respiratory distress.     Breath sounds: No stridor.  Abdominal:     General: There is no distension.     Tenderness: There is no abdominal tenderness.  Musculoskeletal:        General: No deformity.     Cervical back: Normal range of motion and neck supple.     Right lower leg: No edema.     Left lower leg: No edema.  Skin:    General: Skin is warm and dry.  Neurological:     General: No focal deficit present.     Mental Status: She is alert.     Motor: No abnormal muscle tone.  Psychiatric:        Mood and Affect: Mood normal.        Behavior: Behavior normal.     ED Results / Procedures / Treatments   Labs (all labs ordered are listed, but only abnormal results are displayed) Labs Reviewed  COMPREHENSIVE METABOLIC PANEL - Abnormal; Notable for the following components:      Result Value   Glucose, Bld 113 (*)    Calcium 8.4 (*)    Albumin 3.2 (*)    AST 61 (*)    ALT 49 (*)    All other components within normal limits  CBC WITH DIFFERENTIAL/PLATELET - Abnormal; Notable for the following components:   WBC 3.5 (*)    All other components within normal limits  D-DIMER, QUANTITATIVE (NOT AT [facility]) - Abnormal; Notable for the following components:   D-Dimer, Quant 0.79 (*)    All other components within normal  limits  LACTATE DEHYDROGENASE - Abnormal; Notable for the following components:   LDH 227 (*)    All other components within normal limits  FERRITIN - Abnormal; Notable for the following components:   Ferritin 508 (*)    All other components within normal limits  TRIGLYCERIDES - Abnormal; Notable for the following components:   Triglycerides 168 (*)    All other components within normal limits  C-REACTIVE PROTEIN - Abnormal; Notable for the following components:   CRP 1.5 (*)    All other components within normal limits  CULTURE, BLOOD (ROUTINE X 2)  CULTURE, BLOOD (ROUTINE X 2)  LACTIC ACID, PLASMA  PROCALCITONIN  FIBRINOGEN  LACTIC ACID, PLASMA  HIV ANTIBODY (ROUTINE TESTING W REFLEX)  CBC WITH DIFFERENTIAL/PLATELET  COMPREHENSIVE METABOLIC PANEL  C-REACTIVE PROTEIN  D-DIMER, QUANTITATIVE (NOT AT [facility])  HEMOGLOBIN A1C  POC SARS CORONAVIRUS 2 AG -  ED  CBG MONITORING, ED  ABO/RH    EKG EKG Interpretation  Date/Time:  Saturday February 21 2019 21:25:01 EST Ventricular Rate:  89 PR Interval:    QRS Duration: 75 QT Interval:  365 QTC Calculation: 445 R Axis:   32 Text Interpretation: Sinus rhythm Confirmed by Quintella Reichert [phone]) on 02/21/2019 9:31:38 PM   Radiology DG Chest Port 1 View  Result Date: 02/21/2019 CLINICAL DATA:  COVID positive, hypoxia EXAM: PORTABLE CHEST 1 VIEW COMPARISON:  07/25/2006 FINDINGS: The heart size and mediastinal contours are within normal limits. Underpenetrated AP portable examination. Possible subtle heterogeneous bibasilar airspace opacities. The visualized skeletal structures are unremarkable. IMPRESSION: Possible subtle heterogeneous bibasilar airspace opacities  on underpenetrated AP portable examination. No definite acute airspace opacity. PA and lateral radiographs may be helpful to further evaluate. Electronically Signed   By: Eddie Candle M.D.   On: 02/21/2019 21:43    Procedures Procedures (including critical care  time)  Medications Ordered in ED Medications  0.9 %  sodium chloride infusion (has no administration in time range)    ED Course  I have reviewed the triage vital signs and the nursing notes.  Pertinent labs & imaging results that were available during my care of the patient were reviewed by me and considered in my medical decision making (see chart for details).  Clinical Course as of Feb 21 10  Sat Feb 21, 2019  2151 I personally had patient ambulate in the room and she desaturated to 86% on room air with a good and accurate appearing waveform.  She has been 91% on room air at rest.  I placed her on 2 L of oxygen and discuss plans to admit.   [EH]  2258 Spoke with Dr. Alcario Drought who will see patient for admission.     [EH]    Clinical Course User Index [EH] Lorin Glass, PA-C   MDM Rules/Calculators/A&P                     Patient presents today for evaluation of fatigue and hypoxia based on pulse oximeter at home in the setting of reported coronavirus infection.  She states she tested positive on 02/14/2019 and has worsened over the past 3 to 4 days.  On arrival she was febrile at 101.2, tachycardic at 111.  When in the room she was 92 to 91% on room air at rest.  I personally ambulated patient in her room and she desaturated to 86% on room air with a good waveform.  She was also tachypneic prior to this in the high 20s.  Once placed on 2 L of oxygen her saturations improved to the mid to high 90s and her respiratory rate normalized.  Chest x-ray obtained showing possible bibasilar airspace opacities.  EKG without evidence of ischemia.  Chart review shows do not see a documented evidence of Covid positive status therefore rapid antigen testing is ordered.  Labs are obtained and reviewed showing leukopenia at 3.5.  CMP shows minimal transaminitis suspect related to Covid.  D-dimer is elevated at 0.79.  Spoke with Dr. Alcario Drought who agreed to see patient for admission.  Note:  Portions of this report may have been transcribed using voice recognition software. Every effort was made to ensure accuracy; however, inadvertent computerized transcription errors may be present  Final Clinical Impression(s) / ED Diagnoses Final diagnoses:  COVID-19  Acute respiratory failure due to COVID-19 [facility])    Rx / [place] Orders ED Discharge Orders    None       Ollen Gross 02/22/19 Woodward Ku, MD 02/22/19 1454

## 2019-02-21 NOTE — ED Notes (Signed)
ED Provider at bedside. 

## 2019-02-21 NOTE — H&P (Signed)
History and Physical    Marie Wilson [id number] DOB: [birth year] DOA: 02/21/2019  PCP: [facility], [place]  Patient coming from: Home  I have personally briefly reviewed patient's old medical records in [place]  Chief Complaint: SOB  HPI: Marie Wilson is a 54 y.o. female with medical history significant of Stage 1A BRCA in remission, HTN, DM on diet control.  Patient COVID positive on 12/26, worsening fatigue since then.  Cough has worsened.  Still having fevers, 3 episodes of diarrhea today.  Pulse ox at home in the 80s.  Doc recd she come in to ED.   ED Course: Satting upper 80s on 2L via [place] currently.  CRP 1.5, procalcitonin neg.     Review of Systems: As per HPI, otherwise all review of systems negative.  Past Medical History:  Diagnosis Date  . Anemia    one time  . Anxiety   . Breast cancer ([place])    right 2017  . Breast cancer of upper-inner quadrant of right female breast ([place]) 10/28/2015  . Cancer ([place])    skin  . Diabetes mellitus without complication ([place])    diet controlled- recent A1C=5  . Family history of breast cancer   . History of radiation therapy 01/19/16-03/07/16   right breast 50.4 Gy in 28 fractions, right breast boost 10 Gy in 5 fractions  . Hypertension    uses inderal for anxiety prevention which helps maintain BP  . Hypothyroidism   . Personal history of radiation therapy   . Pneumonia     Past Surgical History:  Procedure Laterality Date  . BARTHOLIN GLAND CYST EXCISION    . BREAST BIOPSY    . BREAST LUMPECTOMY Right    right 2017  . BREAST LUMPECTOMY WITH RADIOACTIVE SEED AND SENTINEL LYMPH NODE BIOPSY Right 12/08/2015   Procedure: BREAST LUMPECTOMY WITH RADIOACTIVE SEED AND SENTINEL LYMPH NODE BIOPSY; WITH LYMPHATIC MAPPING;  Surgeon: Erroll Luna, MD;  Location: [facility];  Service: General;  Laterality: Right;  . CESAREAN SECTION    . CHOLECYSTECTOMY    . ROBOTIC ASSISTED TOTAL HYSTERECTOMY Bilateral  09/08/2014   Procedure: ROBOTIC ASSISTED TOTAL HYSTERECTOMY WITH BILATERAL SALPINGECTOMY WITH LYSIS OF ADHESIONS;  Surgeon: Princess Bruins, MD;  Location: [facility];  Service: Gynecology;  Laterality: Bilateral;  . skin growth       reports that she has never smoked. She has never used smokeless tobacco. She reports that she does not drink alcohol or use drugs.  Allergies  Allergen Reactions  . No Known Allergies     Family History  Problem Relation Age of Onset  . Breast cancer Mother 17  . Breast cancer Sister 7  . Hodgkin's lymphoma Sister        dx in her 79s  . Lung cancer Maternal Uncle   . Lung cancer Paternal Uncle   . Breast cancer Cousin 71       maternal first cousin  . Thyroid cancer Cousin 75       maternal first cousin     Prior to Admission medications   Medication Sig Start Date End Date Taking? Authorizing Provider  ALPRAZolam Duanne Moron) 0.5 MG tablet Take 0.5 mg by mouth daily as needed for anxiety.    [provider]  ibuprofen (ADVIL,MOTRIN) 200 MG tablet Take 400-800 mg by mouth every 6 (six) hours as needed for cramping.    [provider]  Levothyroxine Sodium 112 MCG CAPS Take 112 mcg by mouth daily  before breakfast.     [provider]  loratadine (CLARITIN) 10 MG tablet Take 10 mg by mouth daily.    [provider]  Multiple Vitamins-Minerals (MULTIVITAMIN PO) Take 1 tablet by mouth daily.    [provider]  propranolol ER (INDERAL LA) 60 MG 24 hr capsule Take 60 mg by mouth daily.    [provider]  tamoxifen (NOLVADEX) 20 MG tablet TAKE 1 TABLET BY MOUTH ONCE DAILY 07/23/18   Nicholas Lose, MD  venlafaxine XR (EFFEXOR-XR) 75 MG 24 hr capsule Take 1 capsule (75 mg total) by mouth 2 (two) times daily. 05/17/17   Nicholas Lose, MD    Physical Exam: Vitals:   02/21/19 1845 02/21/19 2103 02/21/19 2149 02/21/19 2200  BP: (!) 145/91 131/82  111/77  Pulse: (!) 111 94 85 80  Resp: '18 16 12 14  ' Temp: (!)  101.2 F (38.4 C) 99.2 F (37.3 C)    TempSrc: Oral Oral    SpO2: 94% 95% 97% 93%    Constitutional: NAD, calm, comfortable Eyes: PERRL, lids and conjunctivae normal ENMT: Mucous membranes are moist. Posterior pharynx clear of any exudate or lesions.Normal dentition.  Neck: normal, supple, no masses, no thyromegaly Respiratory: clear to auscultation bilaterally, no wheezing, no crackles. Normal respiratory effort. No accessory muscle use.  Cardiovascular: Regular rate and rhythm, no murmurs / rubs / gallops. No extremity edema. 2+ pedal pulses. No carotid bruits.  Abdomen: no tenderness, no masses palpated. No hepatosplenomegaly. Bowel sounds positive.  Musculoskeletal: no clubbing / cyanosis. No joint deformity upper and lower extremities. Good ROM, no contractures. Normal muscle tone.  Skin: no rashes, lesions, ulcers. No induration Neurologic: CN 2-12 grossly intact. Sensation intact, DTR normal. Strength 5/5 in all 4.  Psychiatric: Normal judgment and insight. Alert and oriented x 3. Normal mood.    Labs on Admission: I have personally reviewed following labs and imaging studies  CBC: Recent Labs  Lab 02/21/19 2127  WBC 3.5*  NEUTROABS 2.3  HGB 13.7  HCT 42.7  MCV 90.9  PLT 767   Basic Metabolic Panel: Recent Labs  Lab 02/21/19 2127  NA 136  K 3.6  CL 103  CO2 23  GLUCOSE 113*  BUN 10  CREATININE 0.87  CALCIUM 8.4*   GFR: CrCl cannot be calculated (Unknown ideal weight.). Liver Function Tests: Recent Labs  Lab 02/21/19 2127  AST 61*  ALT 49*  ALKPHOS 62  BILITOT 0.4  PROT 6.9  ALBUMIN 3.2*   No results for input(s): LIPASE, AMYLASE in the last 168 hours. No results for input(s): AMMONIA in the last 168 hours. Coagulation Profile: No results for input(s): INR, PROTIME in the last 168 hours. Cardiac Enzymes: No results for input(s): CKTOTAL, CKMB, CKMBINDEX, TROPONINI in the last 168 hours. BNP (last 3 results) No results for input(s): PROBNP in the  last 8760 hours. HbA1C: No results for input(s): HGBA1C in the last 72 hours. CBG: No results for input(s): GLUCAP in the last 168 hours. Lipid Profile: Recent Labs    02/21/19 2153  TRIG 168*   Thyroid Function Tests: No results for input(s): TSH, T4TOTAL, FREET4, T3FREE, THYROIDAB in the last 72 hours. Anemia Panel: Recent Labs    02/21/19 2153  FERRITIN 508*   Urine analysis: No results found for: COLORURINE, APPEARANCEUR, LABSPEC, PHURINE, GLUCOSEU, HGBUR, BILIRUBINUR, KETONESUR, PROTEINUR, UROBILINOGEN, NITRITE, LEUKOCYTESUR  Radiological Exams on Admission: DG Chest Port 1 View  Result Date: 02/21/2019 CLINICAL DATA:  COVID positive, hypoxia EXAM: PORTABLE CHEST  1 VIEW COMPARISON:  07/25/2006 FINDINGS: The heart size and mediastinal contours are within normal limits. Underpenetrated AP portable examination. Possible subtle heterogeneous bibasilar airspace opacities. The visualized skeletal structures are unremarkable. IMPRESSION: Possible subtle heterogeneous bibasilar airspace opacities on underpenetrated AP portable examination. No definite acute airspace opacity. PA and lateral radiographs may be helpful to further evaluate. Electronically Signed   By: Eddie Candle M.D.   On: 02/21/2019 21:43    EKG: Independently reviewed.  Assessment/Plan Principal Problem:   Acute hypoxemic respiratory failure due to COVID-19 [facility]) Active Problems:   Breast cancer of upper-inner quadrant of right female breast ([name])   Class 3 severe obesity due to excess calories with serious comorbidity and body mass index (BMI) of 50.0 to 59.9 in adult ([place])    1. COVID-19 with new O2 requirement - 1. COVID pathway 2. remdesivir 3. Decadron 4. Daily labs 5. Cont pulse ox 2. BRCA - 1. Continue tamoxifen once med rec complete 3. DM2 - 1. Normally diet controlled 2. But now on steroids 3. Mod scale SSI AC/HS ordered  DVT prophylaxis: Lovenox Code Status: Full Family Communication: No  family in room Disposition Plan: Home after admit Consults called: None Admission status: Admit to inpatient  Severity of Illness: The appropriate patient status for this patient is INPATIENT. Inpatient status is judged to be reasonable and necessary in order to provide the required intensity of service to ensure the patient's safety. The patient's presenting symptoms, physical exam findings, and initial radiographic and laboratory data in the context of their chronic comorbidities is felt to place them at high risk for further clinical deterioration. Furthermore, it is not anticipated that the patient will be medically stable for discharge from the hospital within 2 midnights of admission. The following factors support the patient status of inpatient.   IP status due to COVID-19 with new O2 requirement.   * I certify that at the point of admission it is my clinical judgment that the patient will require inpatient hospital care spanning beyond 2 midnights from the point of admission due to high intensity of service, high risk for further deterioration and high frequency of surveillance required.*    Adina Puzzo M. DO [facility]  How to contact the [facility] Attending or Consulting provider [place] or covering provider during after hours [place], for this patient?  1. Check the care team in [facility] and look for a) attending/consulting [facility] provider listed and b) the [facility] team listed 2. Log into [email or web address]  Amion Physician Scheduling and messaging for groups and whole hospitals  On call and physician scheduling software for group practices, residents, hospitalists and other medical providers for call, clinic, rotation and shift schedules. OnCall [employer] is a hospital-wide system for scheduling doctors and paging doctors on call. EasyPlot is for scientific plotting and data analysis.  [email or web address]  and use [place]'s universal password to access. If you do not have the password, please  contact the hospital operator.  3. Locate the [facility] provider you are looking for under [facility] and page to a number that you can be directly reached. 4. If you still have difficulty reaching the provider, please page the [facility] (Director on Call) for the Hospitalists listed on amion for assistance.  02/21/2019, 11:17 PM

## 2019-02-21 NOTE — Progress Notes (Signed)
Pharmacy: Remdesivir   Patient is a 54 y.o. female with COVID.  Pharmacy has been consulted for remdesivir dosing.   -CXR shows "Possible subtle heterogeneous bibasilar airspace opacities on underpenetrated AP portable examination. No definite acute airspace opacity. PA and lateral radiographs may be helpful to further evaluate"  -Pt requiring supplemental oxygen (Yes, 2L [place])  -ALT 49    A/P:  - Patient meets criteria for remdesivir. Will initiate remdesivir 200 mg once followed by 100 mg daily x 4 days.  - Daily CMET while on remdesivir - Will f/u pt's ALT and clinical condition

## 2019-02-21 NOTE — ED Triage Notes (Signed)
She c/o testing COVID positive just over a week ago. She c/o persistent uri sx with cough. She is in no distress. She states she checks her SPO2 with a sensor at home; and had a time today when her SPO2 was in the 80's. She phoned Dr. Joya Gaskins, who advised her to come here. She has a remote hx of breast cancer (3 years ago). She also has an impending appointment for anti-COVID antiblody infusion in two days.

## 2019-02-22 ENCOUNTER — Encounter (HOSPITAL_COMMUNITY): Payer: Self-pay | Admitting: Internal Medicine

## 2019-02-22 LAB — CBC WITH DIFFERENTIAL/PLATELET
Abs Immature Granulocytes: 0.01 10*3/uL (ref 0.00–0.07)
Basophils Absolute: 0 10*3/uL (ref 0.0–0.1)
Basophils Relative: 0 %
Eosinophils Absolute: 0 10*3/uL (ref 0.0–0.5)
Eosinophils Relative: 0 %
HCT: 40.7 % (ref 36.0–46.0)
Hemoglobin: 13.3 g/dL (ref 12.0–15.0)
Immature Granulocytes: 0 %
Lymphocytes Relative: 20 %
Lymphs Abs: 0.6 10*3/uL — ABNORMAL LOW (ref 0.7–4.0)
MCH: 29.9 pg (ref 26.0–34.0)
MCHC: 32.7 g/dL (ref 30.0–36.0)
MCV: 91.5 fL (ref 80.0–100.0)
Monocytes Absolute: 0.1 10*3/uL (ref 0.1–1.0)
Monocytes Relative: 4 %
Neutro Abs: 2.3 10*3/uL (ref 1.7–7.7)
Neutrophils Relative %: 76 %
Platelets: 161 10*3/uL (ref 150–400)
RBC: 4.45 MIL/uL (ref 3.87–5.11)
RDW: 12.7 % (ref 11.5–15.5)
WBC: 3 10*3/uL — ABNORMAL LOW (ref 4.0–10.5)
nRBC: 0 % (ref 0.0–0.2)

## 2019-02-22 LAB — COMPREHENSIVE METABOLIC PANEL
ALT: 49 U/L — ABNORMAL HIGH (ref 0–44)
AST: 59 U/L — ABNORMAL HIGH (ref 15–41)
Albumin: 3.1 g/dL — ABNORMAL LOW (ref 3.5–5.0)
Alkaline Phosphatase: 60 U/L (ref 38–126)
Anion gap: 9 (ref 5–15)
BUN: 11 mg/dL (ref 6–20)
CO2: 24 mmol/L (ref 22–32)
Calcium: 8.4 mg/dL — ABNORMAL LOW (ref 8.9–10.3)
Chloride: 104 mmol/L (ref 98–111)
Creatinine, Ser: 0.89 mg/dL (ref 0.44–1.00)
GFR calc Af Amer: >60 mL/min (ref >60)
GFR calc non Af Amer: >60 mL/min (ref >60)
Glucose, Bld: 179 mg/dL — ABNORMAL HIGH (ref 70–99)
Potassium: 3.8 mmol/L (ref 3.5–5.1)
Sodium: 137 mmol/L (ref 135–145)
Total Bilirubin: 0.6 mg/dL (ref 0.3–1.2)
Total Protein: 6.6 g/dL (ref 6.5–8.1)

## 2019-02-22 LAB — GLUCOSE, CAPILLARY: Glucose-Capillary: 141 mg/dL — ABNORMAL HIGH (ref 70–99)

## 2019-02-22 LAB — CBG MONITORING, ED
Glucose-Capillary: 156 mg/dL — ABNORMAL HIGH (ref 70–99)
Glucose-Capillary: 169 mg/dL — ABNORMAL HIGH (ref 70–99)
Glucose-Capillary: 98 mg/dL (ref 70–99)

## 2019-02-22 LAB — D-DIMER, QUANTITATIVE: D-Dimer, Quant: 0.53 ug/mL-FEU — ABNORMAL HIGH (ref 0.00–0.50)

## 2019-02-22 LAB — LACTIC ACID, PLASMA: Lactic Acid, Venous: 1.1 mmol/L (ref 0.5–1.9)

## 2019-02-22 LAB — ABO/RH: ABO/RH(D): O POS

## 2019-02-22 LAB — C-REACTIVE PROTEIN: CRP: 1.7 mg/dL — ABNORMAL HIGH (ref <1.0)

## 2019-02-22 LAB — HEMOGLOBIN A1C
Hgb A1c MFr Bld: 6 % — ABNORMAL HIGH (ref 4.8–5.6)
Mean Plasma Glucose: 125.5 mg/dL

## 2019-02-22 LAB — HIV ANTIBODY (ROUTINE TESTING W REFLEX): HIV Screen 4th Generation wRfx: NONREACTIVE

## 2019-02-22 MED ORDER — VENLAFAXINE HCL ER 75 MG PO CP24
150.0000 mg | ORAL_CAPSULE | Freq: Every day | ORAL | Status: DC
  Administered 2019-02-22 – 2019-02-23 (×2): 150 mg via ORAL
  Filled 2019-02-22 (×2): qty 2

## 2019-02-22 MED ORDER — LEVOTHYROXINE SODIUM 112 MCG PO TABS
112.0000 ug | ORAL_TABLET | Freq: Every day | ORAL | Status: DC
  Administered 2019-02-23: 112 ug via ORAL
  Filled 2019-02-22: qty 1

## 2019-02-22 MED ORDER — TAMOXIFEN CITRATE 10 MG PO TABS
20.0000 mg | ORAL_TABLET | Freq: Every day | ORAL | Status: DC
  Administered 2019-02-23: 10:00:00 20 mg via ORAL
  Filled 2019-02-22 (×2): qty 2

## 2019-02-22 MED ORDER — PROPRANOLOL HCL ER 60 MG PO CP24
60.0000 mg | ORAL_CAPSULE | Freq: Every day | ORAL | Status: DC
  Administered 2019-02-23: 10:00:00 60 mg via ORAL
  Filled 2019-02-22 (×2): qty 1

## 2019-02-22 MED ORDER — ALPRAZOLAM 0.5 MG PO TABS: 0.5000 mg | ORAL_TABLET | Freq: Every day | ORAL | Status: DC | PRN

## 2019-02-22 NOTE — ED Notes (Signed)
Pt. Received a breakfast tray. Nurse aware.  

## 2019-02-22 NOTE — Progress Notes (Signed)
PROGRESS NOTE  Marie Wilson  DOB: [birth year]  PCP: [facility] [place] MRN:[id number]  DOA: 02/21/2019 Admitted From: Home  LOS: 1 day   Chief Complaint  Patient presents with  . Covid Positive   Brief narrative: Marie Wilson is a 54 y.o. female with PMH of  Stage 1A breast cancer in remission, HTN, DM on diet control. Patient presented to the ED on 02/21/2019 with complaint of worsening fatigue, cough, fever and diarrhea after diagnosis of Covid. 12/26, patient was diagnosed with Covid.  Symptoms worsened since then hence presented to ED.  In the ED, she was hypoxic and required 2 L oxygen by nasal cannula. CRP 1.5, procalcitonin neg.   Chest x-ray did not show any definite acute airspace opacity. Patient was admitted to hospitalist service for further evaluation and management.  Subjective: Patient was seen and examined this morning.  Middle-aged Caucasian female.  Not in physical distress.  On oxygen by nasal cannula.  She is anxious because of not having an inpatient bed available yet.  Assessment/Plan: COVID pneumonia Acute respiratory failure with hypoxia  -Presented with dyspnea on exertion, fatigue, cough, fever -Immunocompromised because of history of breast cancer. -Chest imaging -did not show any acute infiltrates -Treatment: IV Decadron 6 mg daily for 10 days, IV Remdesivir for 5 days to complete on 1/6 -Supportive care: Vitamin C, Zinc, inhalers, Tylenol, Antitussives - benzonatate, Mucinex -Oxygen - SpO2: 94 % O2 Flow Rate (L/min): 1 L/min -Labs and biomarker trend as below.  No results found for: [place]  Lab 02/21/19 2127 02/22/19 0432  WBC 3.5* 3.0*   Recent Labs    02/21/19 2153 02/22/19 0432  DDIMER 0.79* 0.53*  FERRITIN 508*  --   LDH 227*  --   CRP 1.5* 1.7*   Type 2 diabetes mellitus -A1c 6 -Diet controlled.  But currently on steroids and blood sugar can run high.  Sliding scale insulin ordered.  Stage I  breast cancer in remission -Continue tamoxifen.  Hypothyroidism -Continue Synthroid  Anxiety/depression -On Xanax, Effexor, propranolol.  -Resume all.  Mobility: Encourage ambulation Diet: Diabetic diet Fluid: Stop IV fluid DVT prophylaxis:  Lovenox subcu Code Status:  Full code Family Communication:  Patient is self updating her family Expected Discharge:  Anticipate discharge to home in next 1 to 2 days if continues to improve  Consultants:    Procedures:    Antimicrobials: Anti-infectives (From admission, onward)   Start     Dose/Rate Route Frequency Ordered Stop   02/22/19 1000  remdesivir 100 mg in sodium chloride 0.9 % 100 mL IVPB     100 mg 200 mL/hr over 30 Minutes Intravenous Daily 02/21/19 2300 02/26/19 0959   02/21/19 2300  remdesivir 200 mg in sodium chloride 0.9% 250 mL IVPB     200 mg 580 mL/hr over 30 Minutes Intravenous Once 02/21/19 2300 02/22/19 0130        Code Status: Full Code   Diet Order            Diet Carb Modified Fluid consistency: Thin; Room service appropriate? Yes  Diet effective now              Infusions:  . sodium chloride Stopped (02/22/19 1128)  . remdesivir 100 mg in NS 100 mL 100 mg (02/22/19 1128)    Scheduled Meds: . albuterol  2 puff Inhalation Q6H  . dexamethasone  6 mg Oral QHS  . enoxaparin (LOVENOX) injection  40 mg Subcutaneous QHS  .  insulin aspart  0-15 Units Subcutaneous TID WC  . insulin aspart  0-5 Units Subcutaneous QHS    PRN meds: acetaminophen, chlorpheniramine-HYDROcodone, guaiFENesin-dextromethorphan, ondansetron **OR** ondansetron (ZOFRAN) IV   Objective: Vitals:   02/22/19 1500 02/22/19 1518  BP: 109/74   Pulse: 84 72  Resp: 17 11  Temp: 98.2 F (36.8 C)   SpO2: 94% 94%   No intake or output data in the 24 hours ending 02/22/19 1751 There were no vitals filed for this visit. Weight change:  There is no height or weight on file to calculate BMI.   Physical Exam: General exam:  Appears calm and comfortable.  Not in physical distress.  Skin: No rashes, lesions or ulcers. HEENT: Atraumatic, normocephalic, supple neck, no obvious bleeding Lungs: Clear to auscultation bilaterally CVS: Regular rate and rhythm, no murmur GI/Abd soft, nontender, nondistended, bowel sound present CNS: Alert, awake, oriented x3 Psychiatry: Anxious Extremities: No pedal edema, no calf tenderness  Data Review: I have personally reviewed the laboratory data and studies available.  Recent Labs  Lab 02/21/19 2127 02/22/19 0432  WBC 3.5* 3.0*  NEUTROABS 2.3 2.3  HGB 13.7 13.3  HCT 42.7 40.7  MCV 90.9 91.5  PLT 186 161   Recent Labs  Lab 02/21/19 2127 02/22/19 0432  NA 136 137  K 3.6 3.8  CL 103 104  CO2 23 24  GLUCOSE 113* 179*  BUN 10 11  CREATININE 0.87 0.89  CALCIUM 8.4* 8.4*    Terrilee Croak, MD  [facility] 02/22/2019

## 2019-02-22 NOTE — ED Notes (Signed)
ED TO INPATIENT HANDOFF REPORT  ED Nurse Name and Phone #: .[facility] rn   S Name/Age/Gender Marie Wilson 54 y.o. female Room/Bed: WA12/WA12  Code Status   Code Status: Full Code  Home/SNF/Other Home Patient oriented to: self, place, time and situation Is this baseline? Yes   Triage Complete: Triage complete  Chief Complaint Acute hypoxemic respiratory failure due to COVID-19 ([place]) [U07.1, J96.01]  Triage Note She c/o testing COVID positive just over a week ago. She c/o persistent uri sx with cough. She is in no distress. She states she checks her SPO2 with a sensor at home; and had a time today when her SPO2 was in the 80's. She phoned Dr. Joya Gaskins, who advised her to come here. She has a remote hx of breast cancer (3 years ago). She also has an impending appointment for anti-COVID antiblody infusion in two days.    Allergies Allergies  Allergen Reactions  . No Known Allergies     Level of Care/Admitting Diagnosis ED Disposition    ED Disposition Condition Comment   Admit  Hospital Area: [facility] [[id number]]  Level of Care: Med-Surg [16]  Covid Evaluation: Confirmed COVID Positive  Diagnosis: Acute hypoxemic respiratory failure due to COVID-19 [facility]) [[id number]]  Admitting Physician: Doreatha Massed  Attending Physician: Etta Quill [phone]  Estimated length of stay: past midnight tomorrow  Certification:: I certify this patient will need inpatient services for at least 2 midnights       B Medical/Surgery History Past Medical History:  Diagnosis Date  . Anemia    one time  . Anxiety   . Breast cancer ([place])    right 2017  . Breast cancer of upper-inner quadrant of right female breast ([place]) 10/28/2015  . Cancer ([place])    skin  . Diabetes mellitus without complication ([place])    diet controlled- recent A1C=5  . Family history of breast cancer   . History of radiation therapy 01/19/16-03/07/16   right breast 50.4 Gy in 28 fractions,  right breast boost 10 Gy in 5 fractions  . Hypertension    uses inderal for anxiety prevention which helps maintain BP  . Hypothyroidism   . Personal history of radiation therapy   . Pneumonia    Past Surgical History:  Procedure Laterality Date  . BARTHOLIN GLAND CYST EXCISION    . BREAST BIOPSY    . BREAST LUMPECTOMY Right    right 2017  . BREAST LUMPECTOMY WITH RADIOACTIVE SEED AND SENTINEL LYMPH NODE BIOPSY Right 12/08/2015   Procedure: BREAST LUMPECTOMY WITH RADIOACTIVE SEED AND SENTINEL LYMPH NODE BIOPSY; WITH LYMPHATIC MAPPING;  Surgeon: Erroll Luna, MD;  Location: [facility];  Service: General;  Laterality: Right;  . CESAREAN SECTION    . CHOLECYSTECTOMY    . ROBOTIC ASSISTED TOTAL HYSTERECTOMY Bilateral 09/08/2014   Procedure: ROBOTIC ASSISTED TOTAL HYSTERECTOMY WITH BILATERAL SALPINGECTOMY WITH LYSIS OF ADHESIONS;  Surgeon: Princess Bruins, MD;  Location: [facility];  Service: Gynecology;  Laterality: Bilateral;  . skin growth       A IV Location/Drains/Wounds Patient Lines/Drains/Airways Status   Active Line/Drains/Airways    Name:   Placement date:   Placement time:   Site:   Days:   Peripheral IV 02/21/19 Left Antecubital   02/21/19    2343    Antecubital   1   Incision (Closed) 09/08/14 Vagina Other (Comment)   09/08/14    0957     1628   Incision (Closed) 09/08/14 Abdomen Other (  Comment)   09/08/14    0957     1628   Incision (Closed) 12/08/15 Breast Other (Comment)   12/08/15    1306     1172   Incision - 5 Ports Abdomen 1: Umbilicus 2: Right;Medial;Lateral 3: Right;Lateral;Lower 4: Left;Lateral;Medial 5: Left;Lateral;Lower   09/08/14    0909     1628          Intake/Output Last 24 hours No intake or output data in the 24 hours ending 02/22/19 1927  Labs/Imaging Results for orders placed or performed during the hospital encounter of 02/21/19 (from the past 48 hour(s))  Comprehensive metabolic panel     Status: Abnormal   Collection Time: 02/21/19  9:27 PM   Result Value Ref Range   Sodium 136 135 - 145 mmol/L   Potassium 3.6 3.5 - 5.1 mmol/L   Chloride 103 98 - 111 mmol/L   CO2 23 22 - 32 mmol/L   Glucose, Bld 113 (H) 70 - 99 mg/dL   BUN 10 6 - 20 mg/dL   Creatinine, Ser 0.87 0.44 - 1.00 mg/dL   Calcium 8.4 (L) 8.9 - 10.3 mg/dL   Total Protein 6.9 6.5 - 8.1 g/dL   Albumin 3.2 (L) 3.5 - 5.0 g/dL   AST 61 (H) 15 - 41 U/L   ALT 49 (H) 0 - 44 U/L   Alkaline Phosphatase 62 38 - 126 U/L   Total Bilirubin 0.4 0.3 - 1.2 mg/dL   GFR calc non Af Amer >60 >60 mL/min   GFR calc Af Amer >60 >60 mL/min   Anion gap 10 5 - 15    Comment: Performed at [facility], [place] [address] [place], [place] [address]  CBC with Differential     Status: Abnormal   Collection Time: 02/21/19  9:27 PM  Result Value Ref Range   WBC 3.5 (L) 4.0 - 10.5 K/uL   RBC 4.70 3.87 - 5.11 MIL/uL   Hemoglobin 13.7 12.0 - 15.0 g/dL   HCT 42.7 36.0 - 46.0 %   MCV 90.9 80.0 - 100.0 fL   MCH 29.1 26.0 - 34.0 pg   MCHC 32.1 30.0 - 36.0 g/dL   RDW 12.4 11.5 - 15.5 %   Platelets 186 150 - 400 K/uL   nRBC 0.0 0.0 - 0.2 %   Neutrophils Relative % 65 %   Neutro Abs 2.3 1.7 - 7.7 K/uL   Lymphocytes Relative 27 %   Lymphs Abs 0.9 0.7 - 4.0 K/uL   Monocytes Relative 7 %   Monocytes Absolute 0.3 0.1 - 1.0 K/uL   Eosinophils Relative 0 %   Eosinophils Absolute 0.0 0.0 - 0.5 K/uL   Basophils Relative 0 %   Basophils Absolute 0.0 0.0 - 0.1 K/uL   Immature Granulocytes 1 %   Abs Immature Granulocytes 0.02 0.00 - 0.07 K/uL   Abnormal Lymphocytes Present PRESENT     Comment: Performed at [facility], [place] [address], [place], [place] [address]  Lactic acid, plasma     Status: None   Collection Time: 02/21/19  9:53 PM  Result Value Ref Range   Lactic Acid, Venous 1.1 0.5 - 1.9 mmol/L    Comment: Performed at [facility], [place] [address] [place], [place] [address]  Blood Culture (routine x 2)     Status: None (Preliminary  result)   Collection Time: 02/21/19  9:53 PM   Specimen: BLOOD  Result Value Ref Range  Specimen Description      BLOOD RIGHT ASSIST CONTROL Performed at [facility] [address], [place], [place] [address]    Special Requests      BOTTLES DRAWN AEROBIC AND ANAEROBIC Blood Culture results may not be optimal due to an inadequate volume of blood received in culture bottles Performed at [facility], [place] [address] [place], [place] [address]    Culture      NO GROWTH < 12 HOURS Performed at [facility] [address], [place], [place] [address]    Report Status PENDING   D-dimer, quantitative     Status: Abnormal   Collection Time: 02/21/19  9:53 PM  Result Value Ref Range   D-Dimer, Quant 0.79 (H) 0.00 - 0.50 ug/mL-FEU    Comment: (NOTE) At the manufacturer cut-off of 0.50 ug/mL FEU, this assay has been documented to exclude PE with a sensitivity and negative predictive value of 97 to 99%.  At this time, this assay has not been approved by the FDA to exclude DVT/VTE. Results should be correlated with clinical presentation. Performed at [facility], [place] [address], [place], [place] [address]   Procalcitonin     Status: None   Collection Time: 02/21/19  9:53 PM  Result Value Ref Range   Procalcitonin <0.10 ng/mL    Comment:        Interpretation: PCT (Procalcitonin) <= 0.5 ng/mL: Systemic infection (sepsis) is not likely. Local bacterial infection is possible. (NOTE)       Sepsis PCT Algorithm           Lower Respiratory Tract                                      Infection PCT Algorithm    ----------------------------     ----------------------------         PCT < 0.25 ng/mL                PCT < 0.10 ng/mL         Strongly encourage             Strongly discourage   discontinuation of antibiotics    initiation of antibiotics    ----------------------------     -----------------------------       PCT  0.25 - 0.50 ng/mL            PCT 0.10 - 0.25 ng/mL               OR       >80% decrease in PCT            Discourage initiation of                                            antibiotics      Encourage discontinuation           of antibiotics    ----------------------------     -----------------------------         PCT >= 0.50 ng/mL              PCT 0.26 - 0.50 ng/mL               AND        <80% decrease in  PCT             Encourage initiation of                                             antibiotics       Encourage continuation           of antibiotics    ----------------------------     -----------------------------        PCT >= 0.50 ng/mL                  PCT > 0.50 ng/mL               AND         increase in PCT                  Strongly encourage                                      initiation of antibiotics    Strongly encourage escalation           of antibiotics                                     -----------------------------                                           PCT <= 0.25 ng/mL                                                 OR                                        > 80% decrease in PCT                                     Discontinue / Do not initiate                                             antibiotics Performed at [place] [address], [place], [place] [address]   Lactate dehydrogenase     Status: Abnormal   Collection Time: 02/21/19  9:53 PM  Result Value Ref Range   LDH 227 (H) 98 - 192 U/L    Comment: Performed at [facility], [place] [address] [place], [place] [address]  Ferritin     Status: Abnormal   Collection Time: 02/21/19  9:53 PM  Result Value Ref Range   Ferritin 508 (H) 11 - 307 ng/mL    Comment: Performed at [facility], [place] [address], [place], [place] [address]  Triglycerides  Status: Abnormal   Collection Time: 02/21/19  9:53 PM  Result Value Ref Range   Triglycerides  168 (H) <150 mg/dL    Comment: Performed at [facility], [place] [address] [place], [place] [address]  Fibrinogen     Status: None   Collection Time: 02/21/19  9:53 PM  Result Value Ref Range   Fibrinogen 444 210 - 475 mg/dL    Comment: Performed at [facility], [place] [address] [place] [address] [place], [place] [address]  C-reactive protein     Status: Abnormal   Collection Time: 02/21/19  9:53 PM  Result Value Ref Range   CRP 1.5 (H) <1.0 mg/dL    Comment: Performed at [facility], [place] [address], [place], [place] [address]  POC SARS Coronavirus 2 Ag-ED - Nasal Swab (BD Veritor Kit)     Status: None   Collection Time: 02/21/19 11:07 PM  Result Value Ref Range   SARS Coronavirus 2 Ag NEGATIVE NEGATIVE    Comment: (NOTE) SARS-CoV-2 antigen NOT DETECTED.  Negative results are presumptive.  Negative results do not preclude SARS-CoV-2 infection and should not be used as the sole basis for treatment or other patient management decisions, including infection  control decisions, particularly in the presence of clinical signs and  symptoms consistent with COVID-19, or in those who have been in contact with the virus.  Negative results must be combined with clinical observations, patient history, and epidemiological information. The expected result is Negative. Fact Sheet for Patients: PodPark.tn Fact Sheet for Healthcare Providers: GiftContent.is This test is not yet approved or cleared by the [place] FDA and  has been authorized for detection and/or diagnosis of SARS-CoV-2 by FDA under an Emergency Use Authorization (EUA).  This EUA will remain in effect (meaning this test can be used) for the duration of  the COVID-19 de claration under Section 564(b)(1) of the Act, 21 U.S.C. section 360bbb-3(b)(1), unless the authorization is terminated or revoked sooner.   ABO/Rh     Status: None    Collection Time: 02/21/19 11:43 PM  Result Value Ref Range   ABO/RH(D)      O POS Performed at [facility], [place] [address], [place], [place] [address]   CBG monitoring, ED     Status: None   Collection Time: 02/22/19 12:00 AM  Result Value Ref Range   Glucose-Capillary 98 70 - 99 mg/dL  Lactic acid, plasma     Status: None   Collection Time: 02/22/19 12:03 AM  Result Value Ref Range   Lactic Acid, Venous 1.1 0.5 - 1.9 mmol/L    Comment: Performed at [facility], [place] [address], [place], [place] [address]  Blood Culture (routine x 2)     Status: None (Preliminary result)   Collection Time: 02/22/19 12:03 AM   Specimen: BLOOD  Result Value Ref Range   Specimen Description      BLOOD LEFT ASSIST CONTROL Performed at [facility] [address] [place], [place] [address]    Special Requests      BOTTLES DRAWN AEROBIC AND ANAEROBIC Blood Culture adequate volume Performed at [place] [address], [place], [place] [address]    Culture      NO GROWTH < 12 HOURS Performed at [facility] [address] [place], [place] [address]    Report Status PENDING   Hemoglobin A1c     Status: Abnormal   Collection Time: 02/22/19  12:03 AM  Result Value Ref Range   Hgb A1c MFr Bld 6.0 (H) 4.8 - 5.6 %    Comment: (NOTE) Pre diabetes:          5.7%-6.4% Diabetes:              >6.4% Glycemic control for   <7.0% adults with diabetes    Mean Plasma Glucose 125.5 mg/dL    Comment: Performed at [place] [address], [place], [place] [address]  CBC with Differential/Platelet     Status: Abnormal   Collection Time: 02/22/19  4:32 AM  Result Value Ref Range   WBC 3.0 (L) 4.0 - 10.5 K/uL   RBC 4.45 3.87 - 5.11 MIL/uL   Hemoglobin 13.3 12.0 - 15.0 g/dL   HCT 40.7 36.0 - 46.0 %   MCV 91.5 80.0 - 100.0 fL   MCH 29.9 26.0 - 34.0 pg   MCHC 32.7 30.0 - 36.0 g/dL   RDW 12.7 11.5 - 15.5 %    Platelets 161 150 - 400 K/uL   nRBC 0.0 0.0 - 0.2 %   Neutrophils Relative % 76 %   Neutro Abs 2.3 1.7 - 7.7 K/uL   Lymphocytes Relative 20 %   Lymphs Abs 0.6 (L) 0.7 - 4.0 K/uL   Monocytes Relative 4 %   Monocytes Absolute 0.1 0.1 - 1.0 K/uL   Eosinophils Relative 0 %   Eosinophils Absolute 0.0 0.0 - 0.5 K/uL   Basophils Relative 0 %   Basophils Absolute 0.0 0.0 - 0.1 K/uL   Immature Granulocytes 0 %   Abs Immature Granulocytes 0.01 0.00 - 0.07 K/uL    Comment: Performed at [facility], [place] [address], [place], [place] [address]  Comprehensive metabolic panel     Status: Abnormal   Collection Time: 02/22/19  4:32 AM  Result Value Ref Range   Sodium 137 135 - 145 mmol/L   Potassium 3.8 3.5 - 5.1 mmol/L   Chloride 104 98 - 111 mmol/L   CO2 24 22 - 32 mmol/L   Glucose, Bld 179 (H) 70 - 99 mg/dL   BUN 11 6 - 20 mg/dL   Creatinine, Ser 0.89 0.44 - 1.00 mg/dL   Calcium 8.4 (L) 8.9 - 10.3 mg/dL   Total Protein 6.6 6.5 - 8.1 g/dL   Albumin 3.1 (L) 3.5 - 5.0 g/dL   AST 59 (H) 15 - 41 U/L   ALT 49 (H) 0 - 44 U/L   Alkaline Phosphatase 60 38 - 126 U/L   Total Bilirubin 0.6 0.3 - 1.2 mg/dL   GFR calc non Af Amer >60 >60 mL/min   GFR calc Af Amer >60 >60 mL/min   Anion gap 9 5 - 15    Comment: Performed at [facility], [place] [address] [place], [place] [address]  C-reactive protein     Status: Abnormal   Collection Time: 02/22/19  4:32 AM  Result Value Ref Range   CRP 1.7 (H) <1.0 mg/dL    Comment: Performed at [facility], [place] [address], [place], [place] [address]  D-dimer, quantitative (not at [facility])     Status: Abnormal   Collection Time: 02/22/19  4:32 AM  Result Value Ref Range   D-Dimer, Quant 0.53 (H) 0.00 - 0.50 ug/mL-FEU    Comment: (NOTE) At the manufacturer cut-off of 0.50 ug/mL FEU, this assay has been documented to exclude PE with a sensitivity and negative predictive value of 97  to 99%.  At this time, this  assay has not been approved by the FDA to exclude DVT/VTE. Results should be correlated with clinical presentation. Performed at [facility], [place] [address], [place], [place] [address]   CBG monitoring, ED     Status: Abnormal   Collection Time: 02/22/19 11:24 AM  Result Value Ref Range   Glucose-Capillary 169 (H) 70 - 99 mg/dL  CBG monitoring, ED     Status: Abnormal   Collection Time: 02/22/19  5:49 PM  Result Value Ref Range   Glucose-Capillary 156 (H) 70 - 99 mg/dL   DG Chest Port 1 View  Result Date: 02/21/2019 CLINICAL DATA:  COVID positive, hypoxia EXAM: PORTABLE CHEST 1 VIEW COMPARISON:  07/25/2006 FINDINGS: The heart size and mediastinal contours are within normal limits. Underpenetrated AP portable examination. Possible subtle heterogeneous bibasilar airspace opacities. The visualized skeletal structures are unremarkable. IMPRESSION: Possible subtle heterogeneous bibasilar airspace opacities on underpenetrated AP portable examination. No definite acute airspace opacity. PA and lateral radiographs may be helpful to further evaluate. Electronically Signed   By: Eddie Candle M.D.   On: 02/21/2019 21:43    Pending Labs Unresulted Labs (From admission, onward)    Start     Ordered   02/22/19 0500  CBC with Differential/Platelet  Daily,   R     02/21/19 2300   02/22/19 0500  Comprehensive metabolic panel  Daily,   R     02/21/19 2300   02/22/19 0500  C-reactive protein  Daily,   R     02/21/19 2300   02/22/19 0500  D-dimer, quantitative (not at [facility])  Daily,   R     02/21/19 2300   02/21/19 2258  HIV Antibody (routine testing w rflx)  (HIV Antibody (Routine testing w reflex) panel)  Once,   STAT     02/21/19 2300          Vitals/Pain Today's Vitals   02/22/19 0630 02/22/19 1500 02/22/19 1518 02/22/19 1924  BP: 103/64 109/74    Pulse: 69 84 72   Resp: (!) _0 Temp:  98.2 F (36.8 C)    TempSrc:  Oral    SpO2: 94% 94% 94%   PainSc:    5      Isolation Precautions Airborne and Contact precautions  Medications Medications  enoxaparin (LOVENOX) injection 40 mg (40 mg Subcutaneous Given 02/22/19 0011)  remdesivir 200 mg in sodium chloride 0.9% 250 mL IVPB (0 mg Intravenous Stopped 02/22/19 0130)    Followed by  remdesivir 100 mg in sodium chloride 0.9 % 100 mL IVPB (100 mg Intravenous New Bag/Given 02/22/19 1128)  albuterol (VENTOLIN HFA) 108 (90 Base) MCG/ACT inhaler 2 puff (2 puffs Inhalation Given 02/22/19 1516)  dexamethasone (DECADRON) tablet 6 mg (6 mg Oral Given 02/22/19 0010)  guaiFENesin-dextromethorphan (ROBITUSSIN DM) 100-10 MG/5ML syrup 10 mL (has no administration in time range)  chlorpheniramine-HYDROcodone (TUSSIONEX) 10-8 MG/5ML suspension 5 mL (has no administration in time range)  acetaminophen (TYLENOL) tablet 650 mg (650 mg Oral Given 02/22/19 1127)  ondansetron (ZOFRAN) tablet 4 mg (has no administration in time range)    Or  ondansetron (ZOFRAN) injection 4 mg (has no administration in time range)  insulin aspart (novoLOG) injection 0-15 Units (3 Units Subcutaneous Given 02/22/19 1127)  insulin aspart (novoLOG) injection 0-5 Units (0 Units Subcutaneous Not Given 02/22/19 0012)  ALPRAZolam (XANAX) tablet 0.5 mg (has no administration in time range)  propranolol ER (INDERAL LA) 24  hr capsule 60 mg (has no administration in time range)  levothyroxine (SYNTHROID) tablet 112 mcg (has no administration in time range)  tamoxifen (NOLVADEX) tablet 20 mg (has no administration in time range)  venlafaxine XR (EFFEXOR-XR) 24 hr capsule 150 mg (has no administration in time range)    Mobility walks Low fall risk   Focused Assessments Pulmonary Assessment Handoff:  Lung sounds:   O2 Device: Room Air O2 Flow Rate (L/min): 1 L/min      R Recommendations: See Admitting Provider Note  Report given to:   Additional Notes:

## 2019-02-23 ENCOUNTER — Ambulatory Visit (HOSPITAL_COMMUNITY): Payer: 59

## 2019-02-23 LAB — CBC WITH DIFFERENTIAL/PLATELET
Abs Immature Granulocytes: 0.03 10*3/uL (ref 0.00–0.07)
Basophils Absolute: 0 10*3/uL (ref 0.0–0.1)
Basophils Relative: 0 %
Eosinophils Absolute: 0 10*3/uL (ref 0.0–0.5)
Eosinophils Relative: 0 %
HCT: 41.6 % (ref 36.0–46.0)
Hemoglobin: 13.2 g/dL (ref 12.0–15.0)
Immature Granulocytes: 1 %
Lymphocytes Relative: 17 %
Lymphs Abs: 0.7 10*3/uL (ref 0.7–4.0)
MCH: 28.7 pg (ref 26.0–34.0)
MCHC: 31.7 g/dL (ref 30.0–36.0)
MCV: 90.4 fL (ref 80.0–100.0)
Monocytes Absolute: 0.1 10*3/uL (ref 0.1–1.0)
Monocytes Relative: 3 %
Neutro Abs: 3.3 10*3/uL (ref 1.7–7.7)
Neutrophils Relative %: 79 %
Platelets: 172 10*3/uL (ref 150–400)
RBC: 4.6 MIL/uL (ref 3.87–5.11)
RDW: 12.3 % (ref 11.5–15.5)
WBC: 4.1 10*3/uL (ref 4.0–10.5)
nRBC: 0 % (ref 0.0–0.2)

## 2019-02-23 LAB — GLUCOSE, CAPILLARY
Glucose-Capillary: 168 mg/dL — ABNORMAL HIGH (ref 70–99)
Glucose-Capillary: 154 mg/dL — ABNORMAL HIGH (ref 70–99)

## 2019-02-23 LAB — COMPREHENSIVE METABOLIC PANEL
ALT: 46 U/L — ABNORMAL HIGH (ref 0–44)
AST: 44 U/L — ABNORMAL HIGH (ref 15–41)
Albumin: 3 g/dL — ABNORMAL LOW (ref 3.5–5.0)
Alkaline Phosphatase: 56 U/L (ref 38–126)
Anion gap: 8 (ref 5–15)
BUN: 12 mg/dL (ref 6–20)
CO2: 24 mmol/L (ref 22–32)
Calcium: 8.7 mg/dL — ABNORMAL LOW (ref 8.9–10.3)
Chloride: 107 mmol/L (ref 98–111)
Creatinine, Ser: 0.69 mg/dL (ref 0.44–1.00)
GFR calc Af Amer: >60 mL/min (ref >60)
GFR calc non Af Amer: >60 mL/min (ref >60)
Glucose, Bld: 194 mg/dL — ABNORMAL HIGH (ref 70–99)
Potassium: 4.1 mmol/L (ref 3.5–5.1)
Sodium: 139 mmol/L (ref 135–145)
Total Bilirubin: 0.3 mg/dL (ref 0.3–1.2)
Total Protein: 6.6 g/dL (ref 6.5–8.1)

## 2019-02-23 LAB — C-REACTIVE PROTEIN: CRP: 1.6 mg/dL — ABNORMAL HIGH (ref <1.0)

## 2019-02-23 LAB — D-DIMER, QUANTITATIVE: D-Dimer, Quant: 0.46 ug/mL-FEU (ref 0.00–0.50)

## 2019-02-23 MED ORDER — ENOXAPARIN SODIUM 60 MG/0.6ML ~~LOC~~ SOLN: 60.0000 mg | Freq: Every day | SUBCUTANEOUS | Status: DC

## 2019-02-23 MED ORDER — DEXAMETHASONE 6 MG PO TABS: 6.0000 mg | ORAL_TABLET | Freq: Every day | ORAL | 0 refills | Status: AC

## 2019-02-23 MED ORDER — GUAIFENESIN-DM 100-10 MG/5ML PO SYRP: 10.0000 mL | ORAL_SOLUTION | Freq: Four times a day (QID) | ORAL | 0 refills | Status: DC | PRN

## 2019-02-23 MED FILL — DEXAMETHASONE 4 MG TABLET: 4 | 7 days supply | Qty: 11 | Fill #0

## 2019-02-23 NOTE — TOC Progression Note (Signed)
Transition of Care [facility]) - Progression Note    Patient Details  Name: Marie Wilson MRN: [id number] Date of Birth: 1965-07-25  Transition of Care [facility]) CM/SW Contact  Purcell Mouton, RN Phone Number: 02/23/2019, 12:36 PM  Clinical Narrative:   Spoke with pt concerning Home O2, [employer] will deliver O2 to 4W for pt to transfer home. PT understands.       Expected Discharge Plan and Services           Expected Discharge Date: 02/23/19                                     Social Determinants of Health (SDOH) Interventions    Readmission Risk Interventions No flowsheet data found.

## 2019-02-23 NOTE — Discharge Summary (Signed)
Physician Discharge Summary  Marie Wilson [id number] DOB: [birth year] DOA: 02/21/2019  PCP: Holland Commons, FNP  Admit date: 02/21/2019 Discharge date: 02/23/2019  Admitted From: Home Discharge disposition: Home   Code Status: Full Code  Diet Recommendation: Diabetic diet   Recommendations for Outpatient Follow-Up:   1. Follow-up with PCP as an outpatient 2. Please return back to ED if symptoms get worse.  Discharge Diagnosis:   Principal Problem:   Acute hypoxemic respiratory failure due to COVID-19 [facility]) Active Problems:   Breast cancer of upper-inner quadrant of right female breast ([place])   Class 3 severe obesity due to excess calories with serious comorbidity and body mass index (BMI) of 50.0 to 59.9 in adult [facility])   History of Present Illness / Brief narrative:  Marie Wilson is a 54 y.o. female with PMH of Stage 1A breast cancer in remission, HTN, DM on diet control. Patient presented to the ED on 02/21/2019 with complaint of worsening fatigue, cough, fever and diarrhea after diagnosis of Covid. 12/26, patient was diagnosed with Covid.  Symptoms worsened since then hence presented to ED.  In the ED, she was hypoxic and required 2 L oxygen by nasal cannula. CRP 1.5, procalcitonin neg.  Chest x-ray did not show any definite acute airspace opacity. Patient was admitted to hospitalist service for further evaluation and management.  Hospital Course:  COVID pneumonia Acute respiratory failure with hypoxia  -Presented with dyspnea on exertion, fatigue, cough, fever -Immunocompromised because of history of breast cancer. -Chest imaging -did not show any acute infiltrates but patient required supplemental oxygen to maintain oxygen saturation over 90% even at rest. -Treatment: Decadron 6 mg daily for 10 days, IV Remdesivir for 5 days to complete on 1/6 -Supportive care: Vitamin C, Zinc, inhalers, Tylenol, Antitussives - benzonatate, Mucinex -Oxygen - SpO2: 94  % O2 Flow Rate (L/min): 1 L/min -Labs and biomarker trend showed improvement as below. -IV remdesivir infusion to continue at clinic on 1/5 and 1/6.  Appointment given. -Oral Decadron 6 mg daily for next 1 week. -Home oxygen arranged.  No results found for: [place]  Lab 02/21/19 2127 02/22/19 0432 02/23/19 0433  WBC 3.5* 3.0* 4.1   Recent Labs    02/21/19 2153 02/22/19 0432 02/23/19 0433  DDIMER 0.79* 0.53* 0.46  FERRITIN 508*  --   --   LDH 227*  --   --   CRP 1.5* 1.7* 1.6*    Type 2 diabetes mellitus -A1c 6 -Diet controlled.  But currently on steroids and blood sugar can run high.    Stage I breast cancer in remission -Continue tamoxifen.  Hypothyroidism -Continue Synthroid  Anxiety/depression -On Xanax, Effexor, propranolol.  -Resume all.  Morbid obesity - Body mass index is 53.59 kg/m. Patient has been advised to make an attempt to improve diet and exercise patterns to aid in weight loss.  Stable for discharge to home today.  Subjective:  Seen and examined this morning.  Pleasant middle-aged Caucasian female.  Lying on bed.  Not in distress on 1 L/min supplemental oxygen.  Discharge Exam:   Vitals:   02/22/19 1518 02/22/19 2100 02/22/19 2110 02/23/19 0454  BP:   121/69 128/72  Pulse: 72  77   Resp: 11  18 16   Temp:   97.9 F (36.6 C) 97.8 F (36.6 C)  TempSrc:    Oral  SpO2: 94%  94% 94%  Weight:  (!) 150.6 kg    Height:  5\' 6"  (1.676 m)  Body mass index is 53.59 kg/m.  General exam: Appears calm and comfortable.  Skin: No rashes, lesions or ulcers. HEENT: Atraumatic, normocephalic, supple neck, no obvious bleeding Lungs: Clear to auscultation bilaterally CVS: Regular rate and rhythm, no murmur GI/Abd soft, nontender, nondistended, bowel sound present CNS: Alert, awake, oriented x3 Psychiatry: Mood appropriate Extremities: No pedal edema, no calf tenderness  Discharge Instructions:  Wound care: None Discharge  Instructions    Increase activity slowly   Complete by: As directed      Follow-up Information    Holland Commons, FNP Follow up.   Specialty: Internal Medicine Contact information: [address] [place] [address] [phone]          Allergies as of 02/23/2019      Reactions   No Known Allergies       Medication List    TAKE these medications   ALPRAZolam 0.5 MG tablet Commonly known as: XANAX Take 0.5 mg by mouth daily as needed for anxiety.   dexamethasone 6 MG tablet Commonly known as: Decadron Take 1 tablet (6 mg total) by mouth daily for 7 days.   Effexor XR 150 MG 24 hr capsule Generic drug: venlafaxine XR Take 150 mg by mouth daily.   guaiFENesin-dextromethorphan 100-10 MG/5ML syrup Commonly known as: ROBITUSSIN DM Take 10 mLs by mouth every 6 (six) hours as needed for cough.   ibuprofen 200 MG tablet Commonly known as: ADVIL Take 400-800 mg by mouth every 6 (six) hours as needed for cramping.   loratadine 10 MG tablet Commonly known as: CLARITIN Take 10 mg by mouth daily.   MULTIVITAMIN PO Take 1 tablet by mouth daily.   propranolol ER 60 MG 24 hr capsule Commonly known as: INDERAL LA Take 60 mg by mouth daily.   Synthroid 112 MCG tablet Generic drug: levothyroxine Take 112 mcg by mouth daily.   tamoxifen 20 MG tablet Commonly known as: NOLVADEX TAKE 1 TABLET BY MOUTH ONCE DAILY            Durable Medical Equipment  (From admission, onward)         Start     Ordered   02/23/19 1215  DME Oxygen  Once    Question Answer Comment  Length of Need 6 Months   Mode or (Route) Nasal cannula   Liters per Minute 2   Frequency Continuous (stationary and portable oxygen unit needed)   Oxygen conserving device Yes   Oxygen delivery system Gas      02/23/19 1215          Time coordinating discharge: 35 minutes  The results of significant diagnostics from this hospitalization (including imaging, microbiology,  ancillary and laboratory) are listed below for reference.    Procedures and Diagnostic Studies:   DG Chest Port 1 View  Result Date: 02/21/2019 CLINICAL DATA:  COVID positive, hypoxia EXAM: PORTABLE CHEST 1 VIEW COMPARISON:  07/25/2006 FINDINGS: The heart size and mediastinal contours are within normal limits. Underpenetrated AP portable examination. Possible subtle heterogeneous bibasilar airspace opacities. The visualized skeletal structures are unremarkable. IMPRESSION: Possible subtle heterogeneous bibasilar airspace opacities on underpenetrated AP portable examination. No definite acute airspace opacity. PA and lateral radiographs may be helpful to further evaluate. Electronically Signed   By: Eddie Candle M.D.   On: 02/21/2019 21:43     Labs:   Basic Metabolic Panel: Recent Labs  Lab 02/21/19 2127 02/22/19 0432 02/23/19 0433  NA 136 137 139  K 3.6 3.8 4.1  CL 103 104 107  CO2 23 24 24   GLUCOSE 113* 179* 194*  BUN 10 11 12   CREATININE 0.87 0.89 0.69  CALCIUM 8.4* 8.4* 8.7*   GFR Estimated Creatinine Clearance: 121.6 mL/min (by C-G formula based on SCr of 0.69 mg/dL). Liver Function Tests: Recent Labs  Lab 02/21/19 2127 02/22/19 0432 02/23/19 0433  AST 61* 59* 44*  ALT 49* 49* 46*  ALKPHOS 62 60 56  BILITOT 0.4 0.6 0.3  PROT 6.9 6.6 6.6  ALBUMIN 3.2* 3.1* 3.0*   No results for input(s): LIPASE, AMYLASE in the last 168 hours. No results for input(s): AMMONIA in the last 168 hours. Coagulation profile No results for input(s): INR, PROTIME in the last 168 hours.  CBC: Recent Labs  Lab 02/21/19 2127 02/22/19 0432 02/23/19 0433  WBC 3.5* 3.0* 4.1  NEUTROABS 2.3 2.3 3.3  HGB 13.7 13.3 13.2  HCT 42.7 40.7 41.6  MCV 90.9 91.5 90.4  PLT 186 161 172   Cardiac Enzymes: No results for input(s): CKTOTAL, CKMB, CKMBINDEX, TROPONINI in the last 168 hours. BNP: Invalid input(s): POCBNP CBG: Recent Labs  Lab 02/22/19 0000 02/22/19 1124 02/22/19 1749  02/22/19 2105 02/23/19 0812  GLUCAP 98 169* 156* 141* 168*   D-Dimer Recent Labs    02/22/19 0432 02/23/19 0433  DDIMER 0.53* 0.46   Hgb A1c Recent Labs    02/22/19 0003  HGBA1C 6.0*   Lipid Profile Recent Labs    02/21/19 2153  TRIG 168*   Thyroid function studies No results for input(s): TSH, T4TOTAL, T3FREE, THYROIDAB in the last 72 hours.  Invalid input(s): FREET3 Anemia work up Recent Labs    02/21/19 2153  FERRITIN 70*   Microbiology Recent Results (from the past 240 hour(s))  Blood Culture (routine x 2)     Status: None (Preliminary result)   Collection Time: 02/21/19  9:53 PM   Specimen: BLOOD  Result Value Ref Range Status   Specimen Description   Final    BLOOD RIGHT ASSIST CONTROL Performed at [facility], [place] [address], [place], [place] [address]    Special Requests   Final    BOTTLES DRAWN AEROBIC AND ANAEROBIC Blood Culture results may not be optimal due to an inadequate volume of blood received in culture bottles Performed at [facility] [address] [place], [place] [address]    Culture   Final    NO GROWTH < 12 HOURS Performed at [facility] [address], [place], [place] [address]    Report Status PENDING  Incomplete  Blood Culture (routine x 2)     Status: None (Preliminary result)   Collection Time: 02/22/19 12:03 AM   Specimen: BLOOD  Result Value Ref Range Status   Specimen Description   Final    BLOOD LEFT ASSIST CONTROL Performed at [facility] [address] [place], [place] [address]    Special Requests   Final    BOTTLES DRAWN AEROBIC AND ANAEROBIC Blood Culture adequate volume Performed at [facility] [address], [place], [place] [address]    Culture   Final    NO GROWTH < 12 HOURS Performed at [facility] [address], [place], [place] [address]    Report Status PENDING  Incomplete    Please note: You  were cared for by a hospitalist during your hospital stay. Once you are discharged, your primary care physician will handle any further medical issues.  Please note that NO REFILLS for any discharge medications will be authorized once you are discharged, as it is imperative that you return to your primary care physician (or establish a relationship with a primary care physician if you do not have one) for your post hospital discharge needs so that they can reassess your need for medications and monitor your lab values.  Signed: Terrilee Croak  [facility] 02/23/2019, 12:16 PM

## 2019-02-23 NOTE — Plan of Care (Signed)
  Problem: Education: Goal: Knowledge of risk factors and measures for prevention of condition will improve Outcome: Adequate for Discharge   Problem: Coping: Goal: Psychosocial and spiritual needs will be supported Outcome: Adequate for Discharge   Problem: Respiratory: Goal: Will maintain a patent airway Outcome: Adequate for Discharge Goal: Complications related to the disease process, condition or treatment will be avoided or minimized Outcome: Adequate for Discharge   Problem: Education: Goal: Knowledge of General Education information will improve Description: Including pain rating scale, medication(s)/side effects and non-pharmacologic comfort measures Outcome: Adequate for Discharge   Problem: Activity: Goal: Risk for activity intolerance will decrease Outcome: Adequate for Discharge   Problem: Coping: Goal: Level of anxiety will decrease Outcome: Adequate for Discharge

## 2019-02-23 NOTE — Progress Notes (Signed)
Patient scheduled for outpatient Remdesivir infusion at 430PM on Tuesday 1/5 and Wednesday 1/6.  Please advise them to report to [facility] at [address]  Drive to the security guard and tell them you are here for an infusion. They will direct you to the front entrance where we will come and get you.  For questions call [phone].  Thanks

## 2019-02-23 NOTE — Discharge Instructions (Signed)
You are scheduled for an outpatient infusion of Remdesivir at 430PM on Tuesday 1/5 and Wednesday 1/6.  Please [facility] to [facility] at [address]  Drive to the security guard and tell them you are here for an infusion. They will direct you to the front entrance where we will come and get you.  For questions call [phone].  Thanks

## 2019-02-23 NOTE — Progress Notes (Signed)
SATURATION QUALIFICATIONS: (This note is used to comply with regulatory documentation for home oxygen)  Patient Saturations on Room Air at Rest = 94%  Patient Saturations on Room Air while Ambulating = 87%  Patient Saturations on 2 Liters of oxygen while Ambulating = 93%  Please briefly explain why patient needs home oxygen: patient desats on ambulating and coughing

## 2019-02-23 NOTE — Progress Notes (Signed)
Patient vitals stable. RN removed intact IV. RN reviewed discharge paperwork and explained oxygen arrangements. Patient was in possession of all personal belongings. RN connected patient to transport oxygen.  NT & RN transported patient to Covid exit where son was waiting. NT & RN put patient belongings into vehicle. RN assisted patient safely into passenger seat.

## 2019-02-24 ENCOUNTER — Ambulatory Visit (HOSPITAL_COMMUNITY)
Admission: RE | Admit: 2019-02-24 | Discharge: 2019-02-24 | Disposition: A | Discharge status: Home / Self Care | Payer: No Typology Code available for payment source | Source: Ambulatory Visit | Attending: Pulmonary Disease | Admitting: Pulmonary Disease

## 2019-02-24 VITALS — BP 107/67 | HR 67 | Temp 98.8°F | Resp 20

## 2019-02-24 DIAGNOSIS — U071 COVID-19: Secondary | ICD-10-CM | POA: Diagnosis present

## 2019-02-24 MED ORDER — FAMOTIDINE IN NACL 20-0.9 MG/50ML-% IV SOLN: 20.0000 mg | Freq: Once | INTRAVENOUS | Status: DC | PRN

## 2019-02-24 MED ORDER — METHYLPREDNISOLONE SODIUM SUCC 125 MG IJ SOLR: 125.0000 mg | Freq: Once | INTRAMUSCULAR | Status: DC | PRN

## 2019-02-24 MED ORDER — SODIUM CHLORIDE 0.9 % IV SOLN: INTRAVENOUS | Status: DC | PRN

## 2019-02-24 MED ORDER — DIPHENHYDRAMINE HCL 50 MG/ML IJ SOLN: 50.0000 mg | Freq: Once | INTRAMUSCULAR | Status: DC | PRN

## 2019-02-24 MED ORDER — SODIUM CHLORIDE 0.9 % IV SOLN
INTRAVENOUS | Status: AC
  Filled 2019-02-24: qty 20

## 2019-02-24 MED ORDER — SODIUM CHLORIDE 0.9 % IV SOLN: 100.0000 mg | Freq: Once | INTRAVENOUS | Status: DC

## 2019-02-24 MED ORDER — EPINEPHRINE 0.3 MG/0.3ML IJ SOAJ: 0.3000 mg | Freq: Once | INTRAMUSCULAR | Status: DC | PRN

## 2019-02-24 MED ORDER — ALBUTEROL SULFATE HFA 108 (90 BASE) MCG/ACT IN AERS: 2.0000 | INHALATION_SPRAY | Freq: Once | RESPIRATORY_TRACT | Status: DC | PRN

## 2019-02-24 MED ORDER — SODIUM CHLORIDE 0.9 % IV SOLN
INTRAVENOUS | Status: DC | PRN
  Administered 2019-02-24: 250 mL via INTRAVENOUS

## 2019-02-24 NOTE — Progress Notes (Signed)
  Diagnosis: COVID-19  Physician: Adria Dill  Procedure: Covid Infusion Clinic Med: remdesivir infusion.  Complications: No immediate complications noted.  Discharge: Discharged home   Marie Wilson Marie Wilson 02/24/2019

## 2019-02-24 NOTE — Discharge Instructions (Signed)

## 2019-02-25 ENCOUNTER — Ambulatory Visit (HOSPITAL_COMMUNITY)
Admit: 2019-02-25 | Discharge: 2019-02-25 | Disposition: A | Discharge status: Home / Self Care | Payer: No Typology Code available for payment source | Attending: Pulmonary Disease | Admitting: Pulmonary Disease

## 2019-02-25 ENCOUNTER — Other Ambulatory Visit: Payer: Self-pay | Admitting: *Deleted

## 2019-02-25 ENCOUNTER — Encounter: Payer: Self-pay | Admitting: *Deleted

## 2019-02-25 DIAGNOSIS — U071 COVID-19: Secondary | ICD-10-CM | POA: Diagnosis not present

## 2019-02-25 MED ORDER — ALBUTEROL SULFATE HFA 108 (90 BASE) MCG/ACT IN AERS: 2.0000 | INHALATION_SPRAY | Freq: Once | RESPIRATORY_TRACT | Status: DC | PRN

## 2019-02-25 MED ORDER — SODIUM CHLORIDE 0.9 % IV SOLN
INTRAVENOUS | Status: AC
  Administered 2019-02-25: 16:00:00 100 mg via INTRAVENOUS
  Filled 2019-02-25: qty 20

## 2019-02-25 MED ORDER — SODIUM CHLORIDE 0.9 % IV SOLN: 100.0000 mg | Freq: Once | INTRAVENOUS | Status: AC

## 2019-02-25 MED ORDER — SODIUM CHLORIDE 0.9 % IV SOLN: INTRAVENOUS | Status: DC | PRN

## 2019-02-25 MED ORDER — EPINEPHRINE 0.3 MG/0.3ML IJ SOAJ: 0.3000 mg | Freq: Once | INTRAMUSCULAR | Status: DC | PRN

## 2019-02-25 MED ORDER — METHYLPREDNISOLONE SODIUM SUCC 125 MG IJ SOLR: 125.0000 mg | Freq: Once | INTRAMUSCULAR | Status: DC | PRN

## 2019-02-25 MED ORDER — FAMOTIDINE IN NACL 20-0.9 MG/50ML-% IV SOLN: 20.0000 mg | Freq: Once | INTRAVENOUS | Status: DC | PRN

## 2019-02-25 MED ORDER — DIPHENHYDRAMINE HCL 50 MG/ML IJ SOLN: 50.0000 mg | Freq: Once | INTRAMUSCULAR | Status: DC | PRN

## 2019-02-25 NOTE — Patient Outreach (Signed)
Marie Marie Wilson) Care Management  02/25/2019  Marie Marie Wilson 09-18-1965 [id number]   Transition of care call/case closure   Referral received:02/22/18 Initial outreach:02/24/18 Insurance: [facility] Plan   Subjective: Initial successful telephone call to patient's preferred number in order to complete transition of care assessment; 2 HIPAA identifiers verified. Explained purpose of call and completed transition of care assessment.  States she is doing okay still a little fatigued. She reports not  wearing oxygen continuously,just off and on, she reports oxygen saturation at 94% with activity without shortness of breath . She denies cough and not having to use prn cough medications.  She reports appetite and intake being pretty good, did not lose smell and taste is just muted,good fluid intake. She reports continuing to monitor temperature no elevations, taking ibuprofen as needed for headache but it has not been bad. She reports tolerating mobility in home. She  denies bowel or bladder problems. She states her 43 year old son has been available to assist in her recovery. She has attended initial outpatient infusion for Remdesivir infusion on yesterday and final infusion on today.  She denies any ongoing health issues states Diabetes is controlled with diet  and says she does  not need a referral to one of the [place] chronic disease management programs.  She does  not have the Marie Wilson indemnity, provided benefit office  contact number for questions related to leave of absence. She does use  a [facility] outpatient [facility]- [facility].   She questions how long to be on quarantine since discharge, reviewed discharge instructions and discharge  summary information length of quarantine not listed . Placed call to [name] [employer] office to clarification on how long to quarantine, spoke with Nevin Bloodgood , usually 10 days from symptom onset and positive test , but since patient  has been hospitalized she will send this information to level 3 provider at [facility] to follow up with patient regarding quarantine timeframe,  relayed this information to patient . She reports understanding has been quarantining in home,isolating in home, with her son wearing mask , gloves and both with proper handwashing, keeping car window down when traveling in car to infusion treatment .  She confirms that she has been receiving calls from at [facility] [employer].    Objective:  Marie Marie Wilson  was hospitalized at [facility]  from 02/21/19-02/23/19 for Covid 19, positive test on 02/14/19.   Comorbidities include: Diabetes diet controlled with A1c 6.0 at 02/22/19, Hypertension , history of breast cancer . She was discharged to home on 02/23/19  with Home oxygen at 2 liters nasal cannula .    Assessment:  Patient voices good understanding of all discharge instructions.  See transition of care flowsheet for assessment details.   Plan:  Reviewed Marie Wilson discharge diagnosis of covid 19  and discharge treatment plan using Marie Wilson discharge instructions, assessing medication adherence, reviewing problems requiring provider notification, and discussing the importance of follow up with surgeon, primary care provider and/or specialists as directed. Reviewed Marie Marie Wilson healthy lifestyle program information to keep premiums low for 2022:  Step 1: Get annual physical between February 19, 2018 and August 20, 2019; Step 2: Complete your health assessment between February 20, 2019 and October 21, 2019 at [email or web address] Step 3:Identify your current health status and complete the corresponding action step between January 1, and October 21, 2019.   Marland Kitchen    No ongoing care management needs identified so will close case  to [facility] services and route successful outreach letter with Photographer and 24 Hour Nurse Line Magnet to [place] [facility] clinical pool to be mailed to patient's home address. Thanked patient for their services to [place] Marie Wilson.  Joylene Draft, RN, [place] Management Coordinator  [phone]- Mobile [phone]- Toll Free Main Office

## 2019-02-25 NOTE — Progress Notes (Signed)
  Diagnosis: COVID-19  Physician: Dr. Wright  Procedure: Covid Infusion Clinic Med: remdesivir infusion.  Complications: No immediate complications noted.  Discharge: Discharged home   Marie Wilson, Marie Wilson 02/25/2019  

## 2019-02-26 ENCOUNTER — Other Ambulatory Visit: Payer: Self-pay | Admitting: *Deleted

## 2019-02-26 NOTE — Patient Outreach (Signed)
[facility]) Care Management  02/26/2019  Marie Wilson 14-Nov-1965 [id number]   Red on EMMI Alert  Day #1 Date:02/25/19 Red Alert Reason : Questions about discharge papers: Yes,  No who to call about changes : No, Follow up scheduled : NO   Outreach attempt #1  Successful outreach call to explained reason for the call and emmi automated call plan. Patient known to Probation officer from telephone outreach on yesterday.   Questions about discharge papers; as discussed on yesterday she had question about how long to be on quarantine , reviewed follow call to [facility] on yesterday , with basic time frame for quarantine 10 days for onset of symptoms reviewed [facility] work representative spoken with on yesterday,Paula, she has taken patient name and will have level 3 provider reach out to patient, she voiced understanding.   Question on Know who to call : She voiced that she has spoken with her Provider on yesterday,she stated no in office appointment follow needed at this time, states she was  encouraged follow up at ED for worsening respiratory symptoms. Explained to patient many provider offices are doing telehealth visit, encouraged to notify MD if new or unresolved  concerns to identify follow up.   Patient denies any other concerns at this time.  She reports continuing to tire easily, after a walk, she denies increased shortness of breath, oxygen saturation on room air 94% range, and still not using oxygen continuously .    Plan  Will close to [facility] care management at this time EMMI red alert addressed.  Will send EMMI on Covid 19 discharge instructions for review.     Joylene Draft, RN, [place] Management Coordinator  [phone]- Mobile [phone]- Toll Free Main Office

## 2019-02-27 LAB — CULTURE, BLOOD (ROUTINE X 2)
Culture: NO GROWTH
Culture: NO GROWTH
Special Requests: ADEQUATE

## 2019-03-12 MED FILL — SYNTHROID 112 MCG TABLET: 112 | 90 days supply | Qty: 90 | Fill #0

## 2019-05-05 MED FILL — VENLAFAXINE HCL ER 150 MG C: 150 | 90 days supply | Qty: 90 | Fill #1

## 2019-05-29 MED FILL — PROPRANOLOL HCL ER 60 MG CP: 60 | 90 days supply | Qty: 90 | Fill #1

## 2019-05-29 MED FILL — TAMOXIFEN 20 MG TABLET: 20 | 90 days supply | Qty: 90 | Fill #3

## 2019-06-16 MED FILL — SYNTHROID 112 MCG TABLET: 112 | 90 days supply | Qty: 90 | Fill #0

## 2019-06-22 MED FILL — CLINDAMYCIN HCL 150 MG CAPS: 150 | 7 days supply | Qty: 30 | Fill #0

## 2019-06-22 MED FILL — HYDROCODON-APAP 5-325: 5-325 | 3 days supply | Qty: 15 | Fill #0

## 2019-08-07 MED FILL — VENLAFAXINE HCL ER 150 MG C: 150 | 90 days supply | Qty: 90 | Fill #0

## 2019-08-07 MED FILL — ALPRAZolam 0.5 MG TABS: 0.5 | 30 days supply | Qty: 60 | Fill #0

## 2019-08-31 ENCOUNTER — Other Ambulatory Visit: Payer: Self-pay | Admitting: Hematology and Oncology

## 2019-08-31 ENCOUNTER — Telehealth: Payer: Self-pay | Admitting: Hematology and Oncology

## 2019-08-31 MED FILL — TAMOXIFEN 20 MG TABLET: 20 | 90 days supply | Qty: 90 | Fill #0

## 2019-08-31 NOTE — Telephone Encounter (Signed)
Scheduled appt per 7/12 sch msg - pt is aware of appt.   

## 2019-09-14 MED FILL — PROPRANOLOL HCL ER 60 MG CP: 60 | 30 days supply | Qty: 30 | Fill #0

## 2019-09-23 ENCOUNTER — Other Ambulatory Visit (HOSPITAL_COMMUNITY): Payer: Self-pay | Admitting: Family Medicine

## 2019-09-23 MED FILL — SYNTHROID 112 MCG TABLET: 112 | 90 days supply | Qty: 90 | Fill #0

## 2019-09-30 NOTE — Progress Notes (Signed)
Patient Care Team: Holland Commons, FNP as PCP - General (Internal Medicine) Erroll Luna, MD as Consulting Physician (General Surgery) Nicholas Lose, MD as Consulting Physician (Hematology and Oncology) Gery Pray, MD as Consulting Physician (Radiation Oncology) Gardenia Phlegm, NP as Nurse Practitioner (Hematology and Oncology)  DIAGNOSIS:    ICD-10-CM   1. Malignant neoplasm of upper-inner quadrant of right breast in female, estrogen receptor positive ([place])  C50.211    Z17.0     SUMMARY OF ONCOLOGIC HISTORY: Oncology History  Breast cancer of upper-inner quadrant of right female breast ([place])  10/27/2015 Initial Diagnosis   Screening detected right breast calcifications spanning 4 mm, biopsy intermediate grade DCIS with small focus of invasive ductal carcinoma ER 95%, PR 95%, HER-2 negative ratio 1.16, Ki-67 2%, T1a N0 stage IA   11/09/2015 Genetic Testing   Genetic testing was normal, and did not reveal a deleterious mutation.  Genes tested:  ATM, BARD1, BRCA1, BRCA2, BRIP1, CDH1, CHEK2, EPCAM, FANCC, MLH1, MSH2, MSH6, NBN, PALB2, PMS2, PTEN, RAD51C, RAD51D, TP53, and XRCC2.     12/08/2015 Surgery   Right lumpectomy ([place]): IDC grade 1, 0.6 cm, DCIS with calcifications, intermediate grade, margins negative, 0/7 lymph nodes negative, ER 95%, PR 95%, HER-2 negative ratio 1.16, Ki-67 2%, T1 BN 0 stage IA   01/19/2016 - 03/07/2016 Radiation Therapy   Adjuvant radiation ([place]):1) Right breast: 50.4 Gy in 28 fractions.  2) Right breast boost: 10 Gy in 5 fractions.   03/12/2016 -  Anti-estrogen oral therapy   Tamoxifen 67m daily.  Planned treatment duration: 5 years.     CHIEF COMPLIANT: Follow-up of right breast cancer on tamoxifen therapy  INTERVAL HISTORY: Marie Wilson a 54y.o. with above-mentioned history of right breast cancer treated with lumpectomy, radiation, and who is currently on tamoxifen. Mammogram on 12/02/18 showed right breast  calcifications. Biopsy on 12/05/19 showed no evidence of malignancy. She presents to the clinic today for follow-up.   ALLERGIES:  is allergic to no known allergies.  MEDICATIONS:  Current Outpatient Medications  Medication Sig Dispense Refill  . ALPRAZolam (XANAX) 0.5 MG tablet Take 0.5 mg by mouth daily as needed for anxiety.    . EFFEXOR XR 150 MG 24 hr capsule Take 150 mg by mouth daily.    .Marland KitchenguaiFENesin-dextromethorphan (ROBITUSSIN DM) 100-10 MG/5ML syrup Take 10 mLs by mouth every 6 (six) hours as needed for cough. 118 mL 0  . ibuprofen (ADVIL,MOTRIN) 200 MG tablet Take 400-800 mg by mouth every 6 (six) hours as needed for cramping.    . loratadine (CLARITIN) 10 MG tablet Take 10 mg by mouth daily.    . Multiple Vitamins-Minerals (MULTIVITAMIN PO) Take 1 tablet by mouth daily.    . propranolol ER (INDERAL LA) 60 MG 24 hr capsule Take 60 mg by mouth daily.    .Marland KitchenSYNTHROID 112 MCG tablet Take 112 mcg by mouth daily.    . tamoxifen (NOLVADEX) 20 MG tablet TAKE 1 TABLET BY MOUTH ONCE DAILY 90 tablet 0   No current facility-administered medications for this visit.    PHYSICAL EXAMINATION: ECOG PERFORMANCE STATUS: 1 - Symptomatic but completely ambulatory  Vitals:   10/01/19 1532  BP: (!) 132/96  Pulse: 72  Resp: 17  Temp: 99.3 F (37.4 C)  SpO2: 96%   Filed Weights   10/01/19 1532  Weight: (!) 342 lb 11.2 oz (155.4 kg)    BREAST: No palpable masses or nodules in either right or left breasts. No palpable  axillary supraclavicular or infraclavicular adenopathy no breast tenderness or nipple discharge. (exam performed in the presence of a chaperone)  LABORATORY DATA:  I have reviewed the data as listed CMP Latest Ref Rng & Units 02/23/2019 02/22/2019 02/21/2019  Glucose 70 - 99 mg/dL 194(H) 179(H) 113(H)  BUN 6 - 20 mg/dL _0 Creatinine 0.44 - 1.00 mg/dL 0.69 0.89 0.87  Sodium 135 - 145 mmol/L 139 137 136  Potassium 3.5 - 5.1 mmol/L 4.1 3.8 3.6  Chloride 98 - 111 mmol/L 107  104 103  CO2 22 - 32 mmol/L _1 Calcium 8.9 - 10.3 mg/dL 8.7(L) 8.4(L) 8.4(L)  Total Protein 6.5 - 8.1 g/dL 6.6 6.6 6.9  Total Bilirubin 0.3 - 1.2 mg/dL 0.3 0.6 0.4  Alkaline Phos 38 - 126 U/L 56 60 62  AST 15 - 41 U/L 44(H) 59(H) 61(H)  ALT 0 - 44 U/L 46(H) 49(H) 49(H)    Lab Results  Component Value Date   WBC 4.1 02/23/2019   HGB 13.2 02/23/2019   HCT 41.6 02/23/2019   MCV 90.4 02/23/2019   PLT 172 02/23/2019   NEUTROABS 3.3 02/23/2019    ASSESSMENT & PLAN:  Breast cancer of upper-inner quadrant of right female breast ([place]) Right lumpectomy 12/08/2015: IDC grade 1, 0.6 cm, DCIS with calcifications, intermediate grade, margins negative, 0/7 lymph nodes negative, ER 95%, PR 95%, HER-2 negative ratio 1.16, Ki-67 2%, T1 BN 0 stage IA Adjuvant radiation: 11/30/2017to01/17/2018  Treatment plan:Antiestrogen therapy with tamoxifen 20 mg dailystarted 04/12/2016 We will finish 5 years of therapy by December 2022 She will stop it at that time.  Tamoxifentoxicities: Severe hot flashes.  In spite of 150 mg of Effexor.  Breast cancer surveillance: 1.    12/02/2018: Mammogram, calcifications anterior aspect of lumpectomy spanning 1.6 cm, second group of calcifications 0.3 cm, biopsy of both revealed fibrocystic change with dystrophic calcifications. 2.   10/01/2019: Breast exam: Benign   Return to clinic in1year    No orders of the defined types were placed in this encounter.  The patient has a good understanding of the overall plan. she agrees with it. she will call with any problems that may develop before the next visit here.  Total time spent: 20 mins including face to face time and time spent for planning, charting and coordination of care  Nicholas Lose, MD 10/01/2019  I, Cloyde Reams Dorshimer, am acting as scribe for Dr. Nicholas Lose.  I have reviewed the above documentation for accuracy and completeness, and I agree with the above.

## 2019-10-01 ENCOUNTER — Other Ambulatory Visit: Payer: Self-pay

## 2019-10-01 ENCOUNTER — Other Ambulatory Visit: Payer: Self-pay | Admitting: Hematology and Oncology

## 2019-10-01 ENCOUNTER — Inpatient Hospital Stay
Payer: No Typology Code available for payment source | Attending: Hematology and Oncology | Admitting: Hematology and Oncology

## 2019-10-01 DIAGNOSIS — C50211 Malignant neoplasm of upper-inner quadrant of right female breast: Secondary | ICD-10-CM | POA: Diagnosis present

## 2019-10-01 DIAGNOSIS — Z923 Personal history of irradiation: Secondary | ICD-10-CM | POA: Insufficient documentation

## 2019-10-01 DIAGNOSIS — Z79899 Other long term (current) drug therapy: Secondary | ICD-10-CM | POA: Diagnosis not present

## 2019-10-01 DIAGNOSIS — Z79810 Long term (current) use of selective estrogen receptor modulators (SERMs): Secondary | ICD-10-CM | POA: Insufficient documentation

## 2019-10-01 DIAGNOSIS — Z170 Estrogen receptor positive status [ER+]: Secondary | ICD-10-CM

## 2019-10-01 MED ORDER — TAMOXIFEN CITRATE 20 MG PO TABS: 20.0000 mg | ORAL_TABLET | Freq: Every day | ORAL | 3 refills | Status: DC

## 2019-10-01 NOTE — Assessment & Plan Note (Signed)
Right lumpectomy 12/08/2015: IDC grade 1, 0.6 cm, DCIS with calcifications, intermediate grade, margins negative, 0/7 lymph nodes negative, ER 95%, PR 95%, HER-2 negative ratio 1.16, Ki-67 2%, T1 BN 0 stage IA Adjuvant radiation: 11/30/2017to01/17/2018  Treatment plan:Antiestrogen therapy with tamoxifen 20 mg dailystarted 04/12/2016 Tamoxifentoxicities: Intermittent hot flashes.  Breast cancer surveillance: 1.    12/02/2018: Mammogram, calcifications anterior aspect of lumpectomy spanning 1.6 cm, second group of calcifications 0.3 cm, biopsy of both revealed fibrocystic change with dystrophic calcifications. 2.   10/01/2019: Breast exam: Benign   Return to clinic in1year 

## 2019-10-05 ENCOUNTER — Telehealth: Payer: Self-pay | Admitting: Hematology and Oncology

## 2019-10-05 NOTE — Telephone Encounter (Signed)
Scheduled per 8/12 los. Called and spoke with pt, confirmed 8/11 appt

## 2019-10-14 MED FILL — PROPRANOLOL HCL ER 60 MG CP: 60 | 30 days supply | Qty: 30 | Fill #0

## 2019-11-04 MED FILL — ALBUTEROL SULFATE HFA 108 (: 108 (90 BAS | 25 days supply | Qty: 18 | Fill #0

## 2019-11-04 MED FILL — AMOXICILLIN 875 MG TABS: 875 | 10 days supply | Qty: 20 | Fill #0

## 2019-11-06 MED FILL — VENLAFAXINE HCL ER 150 MG C: 150 | 90 days supply | Qty: 90 | Fill #1

## 2019-11-16 MED FILL — PROPRANOLOL HCL ER 60 MG CP: 60 | 30 days supply | Qty: 30 | Fill #0

## 2019-11-17 ENCOUNTER — Other Ambulatory Visit (HOSPITAL_COMMUNITY): Payer: Self-pay | Admitting: Family Medicine

## 2019-11-26 ENCOUNTER — Other Ambulatory Visit: Payer: Self-pay

## 2019-11-26 ENCOUNTER — Ambulatory Visit: Payer: No Typology Code available for payment source | Attending: Family Medicine

## 2019-11-26 DIAGNOSIS — R262 Difficulty in walking, not elsewhere classified: Secondary | ICD-10-CM | POA: Insufficient documentation

## 2019-11-26 DIAGNOSIS — M6281 Muscle weakness (generalized): Secondary | ICD-10-CM | POA: Diagnosis present

## 2019-11-26 NOTE — Therapy (Signed)
[facility] [address], [place], [place], [address] Phone: [phone]   Fax:  [phone]  Physical Therapy Evaluation  Patient Details  Name: Marie Wilson MRN: [id number] Date of Birth: [birth year] Referring Provider (Marie Wilson): Milagros Evener, MD   Encounter Date: 11/26/2019   Marie Wilson End of Session - 11/26/19 1017    Visit Number 1    Date for Marie Wilson Re-Evaluation 01/21/20    Authorization Type [facility]    Marie Wilson Start Time 0947   late   Marie Wilson Stop Time 1017    Marie Wilson Time Calculation (min) 30 min    Activity Tolerance Patient tolerated treatment well    Behavior During Therapy [facility] for tasks assessed/performed           Past Medical History:  Diagnosis Date   Anemia    one time   Anxiety    Breast cancer ([place])    right 2017   Breast cancer of upper-inner quadrant of right female breast ([place]) 10/28/2015   Cancer ([place])    skin   Diabetes mellitus without complication ([place])    diet controlled- recent A1C=5   Family history of breast cancer    History of radiation therapy 01/19/16-03/07/16   right breast 50.4 Gy in 28 fractions, right breast boost 10 Gy in 5 fractions   Hypertension    uses inderal for anxiety prevention which helps maintain BP   Hypothyroidism    Personal history of radiation therapy    Pneumonia     Past Surgical History:  Procedure Laterality Date   BARTHOLIN GLAND CYST EXCISION     BREAST BIOPSY     BREAST LUMPECTOMY Right    right 2017   BREAST LUMPECTOMY WITH RADIOACTIVE SEED AND SENTINEL LYMPH NODE BIOPSY Right 12/08/2015   Procedure: BREAST LUMPECTOMY WITH RADIOACTIVE SEED AND SENTINEL LYMPH NODE BIOPSY; WITH LYMPHATIC MAPPING;  Surgeon: Erroll Luna, MD;  Location: [facility];  Service: General;  Laterality: Right;   CESAREAN SECTION     CHOLECYSTECTOMY     ROBOTIC ASSISTED TOTAL HYSTERECTOMY Bilateral 09/08/2014   Procedure: ROBOTIC ASSISTED TOTAL HYSTERECTOMY WITH  BILATERAL SALPINGECTOMY WITH LYSIS OF ADHESIONS;  Surgeon: Princess Bruins, MD;  Location: [facility];  Service: Gynecology;  Laterality: Bilateral;   skin growth      There were no vitals filed for this visit.    Subjective Assessment - 11/26/19 0949    Subjective Marie Wilson presents to Marie Wilson with deconditioning after having Covid in January 2021 and RSV last month.  Marie Wilson reports a general decline in activity.  Marie Wilson has a history of plantar fasciitis and is limited to standing due to this pain.    Pertinent History Covid January 2021, RSV 10/2019, breast cancer    Limitations Standing    How long can you stand comfortably? <10 minutes due to LBP    How long can you walk comfortably? <10 minutes due to foot pain    Patient Stated Goals improve endurance, get into a regular exercise routine    Currently in Pain? No/denies              [facility] Marie Wilson Assessment - 11/26/19 0001      Assessment   Medical Diagnosis physical deconditioning    Referring Provider (Marie Wilson) Milagros Evener, MD    Onset Date/Surgical Date 04/26/18    Next MD Visit 6 months    Prior Therapy none      Precautions   Precautions Fall  Precaution Comments 2-3 falls       Balance Screen   Has the patient fallen in the past 6 months Yes    How many times? 2-3    Marie Wilson will address   Has the patient had a decrease in activity level because of a fear of falling?  Yes    Is the patient reluctant to leave their home because of a fear of falling?  No      Home Environment   Living Environment Private residence    Type of [place] Access Stairs to enter    Entrance Stairs-Number of Steps 3    Home Layout Two level      Prior Function   Level of Independence Independent    Vocation Full time employment    Vocation Requirements desk work      Cognition   Overall Cognitive Status Within Functional Limits for tasks assessed      Posture/Postural Control   Posture/Postural Control Postural limitations    Postural  Limitations Forward head;Rounded Shoulders      ROM / Strength   AROM / PROM / Strength AROM;PROM;Strength      AROM   Overall AROM  Within functional limits for tasks performed      PROM   Overall PROM  Within functional limits for tasks performed      Strength   Overall Strength Deficits    Overall Strength Comments bil hips 4/5, knees 4+/5, ankles 5/5, UEs 4+/5      Special Tests   Other special tests 3 minute walk test 650 feet- mild to moderate fatigue after      Transfers   Transfers Independent with all Transfers    Five time sit to stand comments  15.65 seconds without hands       Ambulation/Gait   Ambulation/Gait Yes    Gait Pattern Within Functional Limits                      Objective measurements completed on examination: See above findings.                 Marie Wilson Short Term Goals - 11/26/19 0948      Marie Wilson SHORT TERM GOAL #1   Title be independent in initial HEP    Time 4    Period Weeks    Status New    Target Date 12/24/19      Marie Wilson SHORT TERM GOAL #2   Title improve endurance to stand and walk > 10-15 minutes without significant fatigue    Time 4    Period Weeks    Status New    Target Date 12/24/19      Marie Wilson SHORT TERM GOAL #3   Title perform 5x sit to stand in < or = to 14 seconds to improve balance    Time 4    Period Weeks    Status New    Target Date 12/24/19             Marie Wilson Long Term Goals - 11/26/19 1056      Marie Wilson LONG TERM GOAL #1   Title be independent in advanced HEP    Baseline --    Time 8    Period Weeks    Status New    Target Date 01/21/20      Marie Wilson LONG TERM GOAL #2   Title perform 5x sit to stand in < or =  to 12 seconds to reduce fall risk    Baseline ---    Time 8    Period Weeks    Status New    Target Date 01/21/20      Marie Wilson LONG TERM GOAL #3   Title perform 6 minute walk test and cover 1500 feet to show improved endurance    Time 8    Period Weeks    Status New    Target Date 01/21/20       Marie Wilson LONG TERM GOAL #4   Title report a 70% reduction in fatigue with standing and walking for daily home and community tasks    Baseline limited due to foot and low back pain so time improvement is not likely    Time 8    Period Weeks    Target Date 01/21/20      Marie Wilson LONG TERM GOAL #5   Title perform regular walking program or swim program and verbalize how to advance for endurance gains    Time 8    Period Weeks    Status New    Target Date 01/21/20                  Plan - 11/26/19 1137    Clinical Impression Statement Marie Wilson presents to Marie Wilson with deconditioning and recent falls.  Marie Wilson had covid in January of 2021 and RSV last month.  Marie Wilson report a steady decline in her strength and endurance when she started to work from home at the beginning of the pandemic.  Marie Wilson has had 2 falls in the past 6 months. Marie Wilson has additional pain comorbidities of bil plantar fasciitis and LBP that limit her ability to stand and exercise.  Marie Wilson performed 5x sit to stand in 15.65 seconds and walked 650 feet in 3 minutes with fatigue. Marie Wilson will benefit from skilled Marie Wilson to improve endurance, establish a strength and endurance program and improve balance to improve safety at home and in the community.    Personal Factors and Comorbidities Comorbidity 2    Comorbidities plantar fasciitis, history of breast cancer, LBP    Examination-Activity Limitations Stand;Locomotion Level;Squat;Stairs    Examination-Participation Restrictions Cleaning;Community Activity;Meal Prep;Laundry    Stability/Clinical Decision Making Evolving/Moderate complexity    Clinical Decision Making Moderate    Rehab Potential Good    Marie Wilson Frequency 2x / week    Marie Wilson Duration 8 weeks    Marie Wilson Treatment/Interventions ADLs/Self Care Home Management;Gait training;Stair training;Functional mobility training;Neuromuscular re-education;Balance training;Therapeutic exercise;Therapeutic activities;Patient/family education;Manual techniques;Passive range of motion    Marie Wilson  Next Visit Plan estabilish HEP for gentle strength, discuss exercise in the water (discussed at evaluation [facility]), issue walking program, balance exercises    Consulted and Agree with Plan of Care Patient           Patient will benefit from skilled therapeutic intervention in order to improve the following deficits and impairments:  Decreased activity tolerance, Decreased strength, Decreased endurance  Visit Diagnosis: Muscle weakness (generalized) - Plan: Marie Wilson plan of care cert/re-cert  Difficulty in walking, not elsewhere classified - Plan: Marie Wilson plan of care cert/re-cert     Problem List Patient Active Problem List   Diagnosis Date Noted   Acute hypoxemic respiratory failure due to COVID-19 ([place]) 02/21/2019   Class 3 severe obesity due to excess calories with serious comorbidity and body mass index (BMI) of 50.0 to 59.9 in adult ([place]) 02/18/2019   COVID-19 virus infection 02/18/2019  Genetic testing 11/22/2015   Family history of breast cancer    Breast cancer of upper-inner quadrant of right female breast ([place]) 10/28/2015   Postoperative state 09/08/2014    Marie Wilson, Marie Wilson 11/26/19 11:52 AM  [facility] [address], [place], [place], [address] Phone: [phone]   Fax:  [phone]  Name: Marie Wilson MRN: [id number] Date of Birth: September 20, 1965

## 2019-11-30 ENCOUNTER — Encounter: Payer: Self-pay | Admitting: Physical Therapy

## 2019-11-30 ENCOUNTER — Other Ambulatory Visit: Payer: Self-pay

## 2019-11-30 ENCOUNTER — Ambulatory Visit: Payer: No Typology Code available for payment source | Admitting: Physical Therapy

## 2019-11-30 DIAGNOSIS — M6281 Muscle weakness (generalized): Secondary | ICD-10-CM

## 2019-11-30 DIAGNOSIS — R262 Difficulty in walking, not elsewhere classified: Secondary | ICD-10-CM

## 2019-11-30 NOTE — Patient Instructions (Signed)
Aim to walk at least five days a week.  Start out warming up with a five-minute, slower paced walk.  Slow your pace to cool down during the last five minutes of your walk. Start at a pace that's comfortable for you. Then gradually pick up speed until you're walking briskly -- generally about 3 to 4 miles an hour.  You should be breathing hard, but you should still be able to carry on a conversation.  Each week, add about two minutes to your walking time. You may choose to repeat a week for 2-3 weeks before advancing if you feel you need to.  12-Week Walking Program - [facility] Week Warm Up (min) Brisk Walking (min) Cool Down (min)  1 5 5 5  2 5 7 5  3 5 9 5  4 5 11 5  5 5 13 5  6 5 15 5  7 5 18 5  8 5 20 5  9 5 23 5  10 5 26 5  11 5 28 5  12 5 30 5    The Department of Health and Human Services recommends getting at least 150 min of moderate aerobic activity (30 min, 5 days a week) or 75 min of vigorous aerobic activity weekly.  Spread this activity out throughout the week.  Even small amounts of physical activity are helpful, and accumulated activity throughout the day adds up to provide health benefits.   http://hess-atkins.info/

## 2019-11-30 NOTE — Therapy (Signed)
[facility] [address] [place], [place], [address] Phone: [phone]   Fax:  [phone]  Physical Therapy Treatment  Patient Details  Name: Marie Wilson MRN: [id number] Date of Birth: [birth year] Referring Provider (PT): Milagros Evener, MD   Encounter Date: 11/30/2019   PT End of Session - 11/30/19 1100    Visit Number 2    Date for PT Re-Evaluation 01/21/20    Authorization Type [facility]    PT Start Time 1017    PT Stop Time 1100    PT Time Calculation (min) 43 min    Activity Tolerance Patient tolerated treatment well    Behavior During Therapy [facility] for tasks assessed/performed           Past Medical History:  Diagnosis Date  . Anemia    one time  . Anxiety   . Breast cancer ([place])    right 2017  . Breast cancer of upper-inner quadrant of right female breast ([place]) 10/28/2015  . Cancer ([place])    skin  . Diabetes mellitus without complication ([place])    diet controlled- recent A1C=5  . Family history of breast cancer   . History of radiation therapy 01/19/16-03/07/16   right breast 50.4 Gy in 28 fractions, right breast boost 10 Gy in 5 fractions  . Hypertension    uses inderal for anxiety prevention which helps maintain BP  . Hypothyroidism   . Personal history of radiation therapy   . Pneumonia     Past Surgical History:  Procedure Laterality Date  . BARTHOLIN GLAND CYST EXCISION    . BREAST BIOPSY    . BREAST LUMPECTOMY Right    right 2017  . BREAST LUMPECTOMY WITH RADIOACTIVE SEED AND SENTINEL LYMPH NODE BIOPSY Right 12/08/2015   Procedure: BREAST LUMPECTOMY WITH RADIOACTIVE SEED AND SENTINEL LYMPH NODE BIOPSY; WITH LYMPHATIC MAPPING;  Surgeon: Erroll Luna, MD;  Location: [place];  Service: General;  Laterality: Right;  . CESAREAN SECTION    . CHOLECYSTECTOMY    . ROBOTIC ASSISTED TOTAL HYSTERECTOMY Bilateral 09/08/2014   Procedure: ROBOTIC ASSISTED TOTAL HYSTERECTOMY WITH BILATERAL  SALPINGECTOMY WITH LYSIS OF ADHESIONS;  Surgeon: Princess Bruins, MD;  Location: [facility];  Service: Gynecology;  Laterality: Bilateral;  . skin growth      There were no vitals filed for this visit.   Subjective Assessment - 11/30/19 1020    Subjective I didn't sleep well last night so a bit tired.    Pertinent History Covid January 2021, RSV 10/2019, breast cancer    Limitations Standing    How long can you stand comfortably? <10 minutes due to LBP    How long can you walk comfortably? <10 minutes due to foot pain    Currently in Pain? No/denies                             [facility] Adult PT Treatment/Exercise - 11/30/19 0001      Self-Care   Self-Care Other Self-Care Comments    Other Self-Care Comments  [facility] 12-week walking program progression, discussion about max HR and aiming for 60-70% max (99-116bpm), used RPE scale to show "Somewhat Hard" as goal, aquatic exercise ideas (multi-directional walking, edge of pool hip strength/ROM)      Exercises   Exercises Lumbar;Knee/Hip;Shoulder      Lumbar Exercises: Aerobic   Nustep L2 x 10', PT took HR several times throughout with HR stable  each check 95-100bpm (60% MHR)      Lumbar Exercises: Standing   Row Strengthening;15 reps;Both;Theraband    Theraband Level (Row) Level 2 (Red)    Row Limitations some Rt shoulder pain     Other Standing Lumbar Exercises counter plank from high mat table 3x10 sec      Knee/Hip Exercises: Seated   Long Arc Quad Strengthening;Both;2 sets;10 reps    Long Arc Quad Limitations chair + black pad    Ball Squeeze 5x5 sit tall with TrA    Other Seated Knee/Hip Exercises heel/toe raises x 20    Marching Both;20 reps    Marching Limitations 2 sets on black pad in chair      Shoulder Exercises: Seated   Flexion AROM;Strengthening;Both;10 reps    Abduction AROM;Both;10 reps    Diagonals AROM;Strengthening;Both;10 reps    Other Seated Exercises shoulder exercises done in sequence  flex/diag/abd as single rep, x 10                    PT Short Term Goals - 11/30/19 1108      PT SHORT TERM GOAL #1   Title be independent in initial HEP    Status On-going             PT Long Term Goals - 11/26/19 1056      PT LONG TERM GOAL #1   Title be independent in advanced HEP    Baseline --    Time 8    Period Weeks    Status New    Target Date 01/21/20      PT LONG TERM GOAL #2   Title perform 5x sit to stand in < or = to 12 seconds to reduce fall risk    Baseline ---    Time 8    Period Weeks    Status New    Target Date 01/21/20      PT LONG TERM GOAL #3   Title perform 6 minute walk test and cover 1500 feet to show improved endurance    Time 8    Period Weeks    Status New    Target Date 01/21/20      PT LONG TERM GOAL #4   Title report a 70% reduction in fatigue with standing and walking for daily home and community tasks    Baseline limited due to foot and low back pain so time improvement is not likely    Time 8    Period Weeks    Target Date 01/21/20      PT LONG TERM GOAL #5   Title perform regular walking program or swim program and verbalize how to advance for endurance gains    Time 8    Period Weeks    Status New    Target Date 01/21/20                 Plan - 11/30/19 1101    Clinical Impression Statement Pt arrives for first follow up session since intial evaluation.  She arrived tired having not slept well but was able to fully participate in therapy session.  PT built seated ther ex circuit including sit to stands, counter plank and standing tband row today with good tolerance.  Pt had mild Rt shoulder pain with rows but this subsided afterwards.  Pt HR was 95-100 with all ther ex putting her in the 60% max HR range, with RPE of 12-14 or "somewhat hard" which is  a good target for Pt given medical history this year.  PT gave Pt 12 week walking program progression and discussed options for water walking and hip strength  in pool.  PT encouraged Pt to treat HEP as circuit for increased HR benefit and to sprinkle sit to stands into circuit more often as needed to get HR up to target (99-116bpm - Pt has HR monitor she can use at home.)  Pt will continue to benefit from skilled progression for strength and endurance with ongoing monitoring of response to treatment.    Comorbidities plantar fasciitis, history of breast cancer, LBP    Rehab Potential Good    PT Frequency 2x / week    PT Duration 8 weeks    PT Treatment/Interventions ADLs/Self Care Home Management;Gait training;Stair training;Functional mobility training;Neuromuscular re-education;Balance training;Therapeutic exercise;Therapeutic activities;Patient/family education;Manual techniques;Passive range of motion    PT Next Visit Plan NuStep, use RPE or HR monitor (target 99-116bpm for 60-70% max HR), f/u on walking program, review HEP, try counter hip abd/ext taps and SLS, standing march, tband UE ext (monitor Rt shoulder pain)    PT Home Exercise Plan Access Code [email or web address] + [email or web address] ([facility] 12 week program), discussed pool ther ex if she joins [place] and Agree with Plan of Care Patient           Patient will benefit from skilled therapeutic intervention in order to improve the following deficits and impairments:     Visit Diagnosis: Muscle weakness (generalized)  Difficulty in walking, not elsewhere classified     Problem List Patient Active Problem List   Diagnosis Date Noted  . Acute hypoxemic respiratory failure due to COVID-19 ([place]) 02/21/2019  . Class 3 severe obesity due to excess calories with serious comorbidity and body mass index (BMI) of 50.0 to 59.9 in adult ([place]) 02/18/2019  . COVID-19 virus infection 02/18/2019  . Genetic testing 11/22/2015  . Family history of breast cancer   . Breast cancer of upper-inner quadrant of right female breast ([place]) 10/28/2015  . Postoperative state 09/08/2014    Baruch Merl, PT 11/30/19 11:09 AM   [facility] [address], [place], [place], [address] Phone: [phone]   Fax:  [phone]  Name: Marie Wilson MRN: [id number] Date of Birth: September 22, 1965

## 2019-12-01 MED FILL — TAMOXIFEN 20 MG TABLET: 20 | 90 days supply | Qty: 90 | Fill #0

## 2019-12-02 ENCOUNTER — Other Ambulatory Visit: Payer: Self-pay

## 2019-12-02 ENCOUNTER — Encounter: Payer: Self-pay | Admitting: Physical Therapy

## 2019-12-02 ENCOUNTER — Ambulatory Visit: Payer: No Typology Code available for payment source | Admitting: Physical Therapy

## 2019-12-02 DIAGNOSIS — M6281 Muscle weakness (generalized): Secondary | ICD-10-CM | POA: Diagnosis not present

## 2019-12-02 DIAGNOSIS — R262 Difficulty in walking, not elsewhere classified: Secondary | ICD-10-CM

## 2019-12-02 NOTE — Therapy (Signed)
[facility] [address], [place], [place], [address] Phone: [phone]   Fax:  [phone]  Physical Therapy Treatment  Patient Details  Name: Marie Wilson MRN: [id number] Date of Birth: 11/26/1965 Referring Provider (PT): Milagros Evener, MD   Encounter Date: 12/02/2019   PT End of Session - 12/02/19 1137    Visit Number 3    Date for PT Re-Evaluation 01/21/20    Authorization Type [facility]    PT Start Time 1135    PT Stop Time 1215    PT Time Calculation (min) 40 min    Activity Tolerance Patient tolerated treatment well    Behavior During Therapy [facility] for tasks assessed/performed           Past Medical History:  Diagnosis Date  . Anemia    one time  . Anxiety   . Breast cancer ([place])    right 2017  . Breast cancer of upper-inner quadrant of right female breast ([place]) 10/28/2015  . Cancer ([place])    skin  . Diabetes mellitus without complication ([place])    diet controlled- recent A1C=5  . Family history of breast cancer   . History of radiation therapy 01/19/16-03/07/16   right breast 50.4 Gy in 28 fractions, right breast boost 10 Gy in 5 fractions  . Hypertension    uses inderal for anxiety prevention which helps maintain BP  . Hypothyroidism   . Personal history of radiation therapy   . Pneumonia     Past Surgical History:  Procedure Laterality Date  . BARTHOLIN GLAND CYST EXCISION    . BREAST BIOPSY    . BREAST LUMPECTOMY Right    right 2017  . BREAST LUMPECTOMY WITH RADIOACTIVE SEED AND SENTINEL LYMPH NODE BIOPSY Right 12/08/2015   Procedure: BREAST LUMPECTOMY WITH RADIOACTIVE SEED AND SENTINEL LYMPH NODE BIOPSY; WITH LYMPHATIC MAPPING;  Surgeon: Erroll Luna, MD;  Location: [place];  Service: General;  Laterality: Right;  . CESAREAN SECTION    . CHOLECYSTECTOMY    . ROBOTIC ASSISTED TOTAL HYSTERECTOMY Bilateral 09/08/2014   Procedure: ROBOTIC ASSISTED TOTAL HYSTERECTOMY WITH BILATERAL  SALPINGECTOMY WITH LYSIS OF ADHESIONS;  Surgeon: Princess Bruins, MD;  Location: [facility];  Service: Gynecology;  Laterality: Bilateral;  . skin growth      There were no vitals filed for this visit.   Subjective Assessment - 12/02/19 1142    Subjective No new complaints this AM. Normal fatigue feeling.    Pertinent History Covid January 2021, RSV 10/2019, breast cancer    Currently in Pain? No/denies    Multiple Pain Sites No                             [facility] Adult PT Treatment/Exercise - 12/02/19 0001      Lumbar Exercises: Aerobic   Nustep L2 x 10', PTA took HR several times throughout with HR stable each check 95-100bpm (60% MHR)   Baseline HE 77BPM     Lumbar Exercises: Standing   Row Strengthening;Both;Theraband   2x15   Theraband Level (Row) Level 2 (Red)    Shoulder Extension Strengthening;Both;15 reps;Theraband    Theraband Level (Shoulder Extension) Level 2 (Red)      Knee/Hip Exercises: Standing   Heel Raises Both;1 set;10 reps   2 sets   Heel Raises Limitations Light UE touching    Hip Flexion AROM;Stengthening;Both;1 set;10 reps;Knee bent    Hip Abduction AROM;Stengthening;Both;1 set;10 reps;Knee  straight      Knee/Hip Exercises: Seated   Long Arc Quad Strengthening;Both;1 set;10 reps;Weights    Long Arc Quad Weight 1 lbs.    Long Arc Quad Limitations No pad    Ball Squeeze 8x5 sec with core activation    Sit to [employer] 1 set;2 sets;without UE support   on black pad                   PT Short Term Goals - 11/30/19 1108      PT SHORT TERM GOAL #1   Title be independent in initial HEP    Status On-going             PT Long Term Goals - 11/26/19 1056      PT LONG TERM GOAL #1   Title be independent in advanced HEP    Baseline --    Time 8    Period Weeks    Status New    Target Date 01/21/20      PT LONG TERM GOAL #2   Title perform 5x sit to stand in < or = to 12 seconds to reduce fall risk    Baseline ---    Time 8      Period Weeks    Status New    Target Date 01/21/20      PT LONG TERM GOAL #3   Title perform 6 minute walk test and cover 1500 feet to show improved endurance    Time 8    Period Weeks    Status New    Target Date 01/21/20      PT LONG TERM GOAL #4   Title report a 70% reduction in fatigue with standing and walking for daily home and community tasks    Baseline limited due to foot and low back pain so time improvement is not likely    Time 8    Period Weeks    Target Date 01/21/20      PT LONG TERM GOAL #5   Title perform regular walking program or swim program and verbalize how to advance for endurance gains    Time 8    Period Weeks    Status New    Target Date 01/21/20                 Plan - 12/02/19 1140    Clinical Impression Statement Pt presents today with "my normal fatigue." Pt could maintain > 88 SPM on teh Nustep for teh entire 10 min with apprpriate HR response. Pt was able to add light resisatnce on LAQ today, and move most seated exercises to standing. Pt was challenged appropriately. Fatigued but not too fatigued.    Personal Factors and Comorbidities Comorbidity 2    Comorbidities plantar fasciitis, history of breast cancer, LBP    Examination-Activity Limitations Stand;Locomotion Level;Squat;Stairs    Examination-Participation Restrictions Cleaning;Community Activity;Meal Prep;Laundry    Stability/Clinical Decision Making Evolving/Moderate complexity    Rehab Potential Good    PT Frequency 2x / week    PT Duration 8 weeks    PT Treatment/Interventions ADLs/Self Care Home Management;Gait training;Stair training;Functional mobility training;Neuromuscular re-education;Balance training;Therapeutic exercise;Therapeutic activities;Patient/family education;Manual techniques;Passive range of motion    PT Next Visit Plan NuStep, use RPE or HR monitor (target 99-116bpm for 60-70% max HR), f/u on walking program, review HEP, try counter hip abd/ext taps and SLS,  standing march, tband UE ext (monitor Rt shoulder pain)    PT Home Exercise Plan  Access Code [email or web address] + [email or web address] [facility] 12 week program), discussed pool ther ex if she joins [place] and Agree with Plan of Care Patient           Patient will benefit from skilled therapeutic intervention in order to improve the following deficits and impairments:  Decreased activity tolerance, Decreased strength, Decreased endurance  Visit Diagnosis: Muscle weakness (generalized)  Difficulty in walking, not elsewhere classified     Problem List Patient Active Problem List   Diagnosis Date Noted  . Acute hypoxemic respiratory failure due to COVID-19 ([place]) 02/21/2019  . Class 3 severe obesity due to excess calories with serious comorbidity and body mass index (BMI) of 50.0 to 59.9 in adult ([place]) 02/18/2019  . COVID-19 virus infection 02/18/2019  . Genetic testing 11/22/2015  . Family history of breast cancer   . Breast cancer of upper-inner quadrant of right female breast ([place]) 10/28/2015  . Postoperative state 09/08/2014    Jashaun Penrose, PTA 12/02/2019, 12:19 PM  [facility] [address] [place], [place], [address] Phone: [phone]   Fax:  [phone]  Name: Marie Wilson MRN: [id number] Date of Birth: 09/25/1965

## 2019-12-09 ENCOUNTER — Other Ambulatory Visit: Payer: Self-pay

## 2019-12-09 ENCOUNTER — Ambulatory Visit: Payer: No Typology Code available for payment source | Admitting: Physical Therapy

## 2019-12-09 DIAGNOSIS — M6281 Muscle weakness (generalized): Secondary | ICD-10-CM

## 2019-12-09 DIAGNOSIS — R262 Difficulty in walking, not elsewhere classified: Secondary | ICD-10-CM

## 2019-12-09 NOTE — Therapy (Signed)
[facility] [address], [place], [place], [address] Phone: [phone]   Fax:  [phone]  Physical Therapy Treatment  Patient Details  Name: Marie Wilson MRN: [id number] Date of Birth: 10-Mar-1965 Referring Provider (PT): Milagros Evener, MD   Encounter Date: 12/09/2019   PT End of Session - 12/09/19 0848    Visit Number 4    Date for PT Re-Evaluation 01/21/20    Authorization Type [facility]    PT Start Time 0848    PT Stop Time 0928    PT Time Calculation (min) 40 min    Activity Tolerance Patient tolerated treatment well    Behavior During Therapy [facility] for tasks assessed/performed           Past Medical History:  Diagnosis Date  . Anemia    one time  . Anxiety   . Breast cancer ([place])    right 2017  . Breast cancer of upper-inner quadrant of right female breast ([place]) 10/28/2015  . Cancer ([place])    skin  . Diabetes mellitus without complication ([place])    diet controlled- recent A1C=5  . Family history of breast cancer   . History of radiation therapy 01/19/16-03/07/16   right breast 50.4 Gy in 28 fractions, right breast boost 10 Gy in 5 fractions  . Hypertension    uses inderal for anxiety prevention which helps maintain BP  . Hypothyroidism   . Personal history of radiation therapy   . Pneumonia     Past Surgical History:  Procedure Laterality Date  . BARTHOLIN GLAND CYST EXCISION    . BREAST BIOPSY    . BREAST LUMPECTOMY Right    right 2017  . BREAST LUMPECTOMY WITH RADIOACTIVE SEED AND SENTINEL LYMPH NODE BIOPSY Right 12/08/2015   Procedure: BREAST LUMPECTOMY WITH RADIOACTIVE SEED AND SENTINEL LYMPH NODE BIOPSY; WITH LYMPHATIC MAPPING;  Surgeon: Erroll Luna, MD;  Location: [facility];  Service: General;  Laterality: Right;  . CESAREAN SECTION    . CHOLECYSTECTOMY    . ROBOTIC ASSISTED TOTAL HYSTERECTOMY Bilateral 09/08/2014   Procedure: ROBOTIC ASSISTED TOTAL HYSTERECTOMY WITH BILATERAL  SALPINGECTOMY WITH LYSIS OF ADHESIONS;  Surgeon: Princess Bruins, MD;  Location: [facility];  Service: Gynecology;  Laterality: Bilateral;  . skin growth      There were no vitals filed for this visit.                      [facility] Adult PT Treatment/Exercise - 12/09/19 0001      Lumbar Exercises: Aerobic   Nustep L3 x 10 min with PTa present to monitor      Lumbar Exercises: Standing   Row Strengthening;Both;15 reps;Theraband    Theraband Level (Row) Level 3 (Green)    Shoulder Extension Strengthening;Both;10 reps;Theraband    Theraband Level (Shoulder Extension) Level 3 (Green)    Other Standing Lumbar Exercises counter plank from high mat table23x20 sec      Knee/Hip Exercises: Standing   Heel Raises Both;1 set;15 reps    Heel Raises Limitations Light UE touching    Hip Flexion AROM;Stengthening;Both;1 set;10 reps;Knee bent    Hip Flexion Limitations 1#    Hip Abduction AROM;Stengthening;Both;1 set;10 reps;Knee straight    Abduction Limitations 1#    Walking with Sports Cord 15# forward and backward walking 6x each CGA.       Knee/Hip Exercises: Seated   Long Arc Quad Strengthening;Both;1 set;15 reps;Weights    Long [employer] Weight 1  lbs.    Long Arc Quad Limitations No pad    Sit to [employer] 2 sets;without UE support   No pad                   PT Short Term Goals - 12/09/19 0856      PT SHORT TERM GOAL #1   Title be independent in initial HEP    Time 4    Period Weeks    Status Achieved    Target Date 12/24/19      PT SHORT TERM GOAL #3   Title perform 5x sit to stand in < or = to 14 seconds to improve balance    Time 4    Period Weeks    Status Achieved   10   Target Date 12/24/19             PT Long Term Goals - 12/09/19 0858      PT LONG TERM GOAL #2   Title perform 5x sit to stand in < or = to 12 seconds to reduce fall risk    Time 8    Period Weeks    Status Achieved   10                Plan - 12/09/19 0848     Clinical Impression Statement Pt tolerating all increases in loads. Focused on increasing reps today for better functional activity tolerance. Pt' s reported RPE within normal limits. Pt enjoyed the balance challenge with resisted walking. PTA required to hold onto velcro strap. Pt met sit to stand goal for both ST & LT goals today.    Personal Factors and Comorbidities Comorbidity 2    Comorbidities plantar fasciitis, history of breast cancer, LBP    Examination-Activity Limitations Stand;Locomotion Level;Squat;Stairs    Examination-Participation Restrictions Cleaning;Community Activity;Meal Prep;Laundry    Stability/Clinical Decision Making Evolving/Moderate complexity    Rehab Potential Good    PT Frequency 2x / week    PT Duration 8 weeks    PT Treatment/Interventions ADLs/Self Care Home Management;Gait training;Stair training;Functional mobility training;Neuromuscular re-education;Balance training;Therapeutic exercise;Therapeutic activities;Patient/family education;Manual techniques;Passive range of motion    PT Next Visit Plan Can we send cert to MD adding aquatic therapy next visit, please? Pt is interested in doing a few aquatic session with [place] before going to the [employer] in order to learn what to do. Continue to progress LE/UE strength and endurance and balance.    PT Home Exercise Plan Access Code [email or web address] + [email or web address] [facility] 12 week program), discussed pool ther ex if she joins [place] and Agree with Plan of Care Patient           Patient will benefit from skilled therapeutic intervention in order to improve the following deficits and impairments:  Decreased activity tolerance, Decreased strength, Decreased endurance  Visit Diagnosis: Muscle weakness (generalized)  Difficulty in walking, not elsewhere classified     Problem List Patient Active Problem List   Diagnosis Date Noted  . Acute hypoxemic respiratory failure due to COVID-19 ([place]) 02/21/2019    . Class 3 severe obesity due to excess calories with serious comorbidity and body mass index (BMI) of 50.0 to 59.9 in adult ([place]) 02/18/2019  . COVID-19 virus infection 02/18/2019  . Genetic testing 11/22/2015  . Family history of breast cancer   . Breast cancer of upper-inner quadrant of right female breast ([place]) 10/28/2015  . Postoperative state 09/08/2014    Shirleen Mcfaul, PTA 12/09/2019,  9:22 AM  [facility] [address], [place], [place], [address] Phone: [phone]   Fax:  [phone]  Name: Marie Wilson MRN: [id number] Date of Birth: Mar 15, 1965

## 2019-12-15 MED FILL — PROPRANOLOL HCL ER 60 MG CP: 60 | 30 days supply | Qty: 30 | Fill #0

## 2019-12-21 ENCOUNTER — Ambulatory Visit: Payer: No Typology Code available for payment source | Attending: Family Medicine | Admitting: Physical Therapy

## 2019-12-21 DIAGNOSIS — R262 Difficulty in walking, not elsewhere classified: Secondary | ICD-10-CM | POA: Insufficient documentation

## 2019-12-21 DIAGNOSIS — M6281 Muscle weakness (generalized): Secondary | ICD-10-CM | POA: Insufficient documentation

## 2019-12-25 MED FILL — SYNTHROID 112 MCG TABLET: 112 | 90 days supply | Qty: 90 | Fill #1

## 2019-12-28 ENCOUNTER — Ambulatory Visit: Payer: No Typology Code available for payment source | Admitting: Physical Therapy

## 2019-12-28 ENCOUNTER — Encounter: Payer: Self-pay | Admitting: Physical Therapy

## 2019-12-28 ENCOUNTER — Other Ambulatory Visit: Payer: Self-pay

## 2019-12-28 DIAGNOSIS — R262 Difficulty in walking, not elsewhere classified: Secondary | ICD-10-CM | POA: Diagnosis present

## 2019-12-28 DIAGNOSIS — M6281 Muscle weakness (generalized): Secondary | ICD-10-CM | POA: Diagnosis present

## 2019-12-28 NOTE — Therapy (Addendum)
[facility] [address] [place], [place], [address] Phone: [phone]   Fax:  [phone]  Physical Therapy Treatment  Patient Details  Name: Charie Pinkus MRN: [id number] Date of Birth: 30-May-1965 Referring Provider (PT): Milagros Evener, MD   Encounter Date: 12/28/2019   PT End of Session - 12/28/19 1149    Visit Number 5    Date for PT Re-Evaluation 01/21/20    Authorization Type [facility]    PT Start Time 1149    PT Stop Time 1231    PT Time Calculation (min) 42 min    Activity Tolerance Patient tolerated treatment well    Behavior During Therapy [facility] for tasks assessed/performed           Past Medical History:  Diagnosis Date  . Anemia    one time  . Anxiety   . Breast cancer ([place])    right 2017  . Breast cancer of upper-inner quadrant of right female breast ([place]) 10/28/2015  . Cancer ([place])    skin  . Diabetes mellitus without complication ([place])    diet controlled- recent A1C=5  . Family history of breast cancer   . History of radiation therapy 01/19/16-03/07/16   right breast 50.4 Gy in 28 fractions, right breast boost 10 Gy in 5 fractions  . Hypertension    uses inderal for anxiety prevention which helps maintain BP  . Hypothyroidism   . Personal history of radiation therapy   . Pneumonia     Past Surgical History:  Procedure Laterality Date  . BARTHOLIN GLAND CYST EXCISION    . BREAST BIOPSY    . BREAST LUMPECTOMY Right    right 2017  . BREAST LUMPECTOMY WITH RADIOACTIVE SEED AND SENTINEL LYMPH NODE BIOPSY Right 12/08/2015   Procedure: BREAST LUMPECTOMY WITH RADIOACTIVE SEED AND SENTINEL LYMPH NODE BIOPSY; WITH LYMPHATIC MAPPING;  Surgeon: Erroll Luna, MD;  Location: [facility];  Service: General;  Laterality: Right;  . CESAREAN SECTION    . CHOLECYSTECTOMY    . ROBOTIC ASSISTED TOTAL HYSTERECTOMY Bilateral 09/08/2014   Procedure: ROBOTIC ASSISTED TOTAL HYSTERECTOMY WITH BILATERAL  SALPINGECTOMY WITH LYSIS OF ADHESIONS;  Surgeon: Princess Bruins, MD;  Location: [facility];  Service: Gynecology;  Laterality: Bilateral;  . skin growth      There were no vitals filed for this visit.   Subjective Assessment - 12/28/19 1150    Subjective Reports some back pain with everyday walking. Also noticed that she wasn't able to get enough air in when hiking on 11/2.    Pertinent History Covid January 2021, RSV 10/2019, breast cancer    Limitations Standing    How long can you stand comfortably? <10 minutes due to LBP    How long can you walk comfortably? 10 min due breathing    Patient Stated Goals improve endurance, get into a regular exercise routine                             [facility] Adult PT Treatment/Exercise - 12/28/19 0001      Self-Care   Self-Care Posture;ADL's    ADL's techniques reveiwed for loading dishwasher and standing at sink doing ADLS    Posture neutral spine posture in standing and sitting    Other Self-Care Comments  discussed intiating a walking program; Pt advised to tell someone that she can be accountable to; start with 2 short walks per day and to have a  checklist/calendar that she can check off when complete.      Lumbar Exercises: Aerobic   Nustep L3 x 10 min with PTa present to monitor   Dist 0.53 m 105 SPM; maintaining HR between 107 and 115 BPM     Lumbar Exercises: Seated   Other Seated Lumbar Exercises pelvic rocking x 20       Lumbar Exercises: Supine   Ab Set 5 reps;5 seconds    Pelvic Tilt 2 reps    Bridge Limitations bridge is difficult and painful but pt would benefit from this    Other Supine Lumbar Exercises Diaphragmatic breathing in hooklying with and without pelvic floor engagment; Worked on doubling exhale compared to inhale time.                     PT Short Term Goals - 12/28/19 1200      PT SHORT TERM GOAL #1   Title be independent in initial HEP    Status Achieved      PT SHORT TERM GOAL #2    Title improve endurance to stand and walk > 10-15 minutes without significant fatigue    Baseline 10 min limit then waits 4-5 min before continuing    Status On-going      PT SHORT TERM GOAL #3   Title perform 5x sit to stand in < or = to 14 seconds to improve balance    Status Achieved             PT Long Term Goals - 12/28/19 1223      PT LONG TERM GOAL #1   Title be independent in advanced HEP    Status On-going      PT LONG TERM GOAL #2   Title perform 5x sit to stand in < or = to 12 seconds to reduce fall risk    Status Achieved      PT LONG TERM GOAL #3   Title perform 6 minute walk test and cover 1500 feet to show improved endurance    Status New      PT LONG TERM GOAL #4   Title report a 70% reduction in fatigue with standing and walking for daily home and community tasks    Baseline still limited to 10 min    Status On-going      PT LONG TERM GOAL #5   Title perform regular walking program or swim program and verbalize how to advance for endurance gains    Status On-going                 Plan - 12/28/19 1354    Clinical Impression Statement Patinet presents with c/o increased low back pain when she is standing and walking with normal ADLS. We focused this visit on neutral posture, diaphragmatic breathing, transverse abominus strengthening and some ADL modifications. Starting a walking program was also encouraged again with accountability. She did well with todays treatment and will benefit from continued education on TA, ADL mods and breathing.    Personal Factors and Comorbidities Comorbidity 2    Comorbidities plantar fasciitis, history of breast cancer, LBP    Examination-Activity Limitations Stand;Locomotion Level;Squat;Stairs    Examination-Participation Restrictions Cleaning;Community Activity;Meal Prep;Laundry    Stability/Clinical Decision Making Evolving/Moderate complexity    PT Frequency 2x / week    PT Duration 8 weeks    PT  Treatment/Interventions ADLs/Self Care Home Management;Gait training;Stair training;Functional mobility training;Neuromuscular re-education;Balance training;Therapeutic exercise;Therapeutic activities;Patient/family education;Manual techniques;Passive range of   motion;Aquatic Therapy    PT Next Visit Plan cont with TA progression, diaphragmatic breathing, ADL modifications. Recert sent for aquatic therapy.    PT Home Exercise Plan Access Code [email or web address] + [email or web address] ([facility] 12 week program), discussed pool ther ex if she joins [employer]    Consulted and Agree with Plan of Care Patient           Patient will benefit from skilled therapeutic intervention in order to improve the following deficits and impairments:  Decreased activity tolerance, Decreased strength, Decreased endurance  Visit Diagnosis: Muscle weakness (generalized) - Plan: PT plan of care cert/re-cert  Difficulty in walking, not elsewhere classified - Plan: PT plan of care cert/re-cert     Problem List Patient Active Problem List   Diagnosis Date Noted  . Acute hypoxemic respiratory failure due to COVID-19 ([name]) 02/21/2019  . Class 3 severe obesity due to excess calories with serious comorbidity and body mass index (BMI) of 50.0 to 59.9 in adult ([name]) 02/18/2019  . COVID-19 virus infection 02/18/2019  . Genetic testing 11/22/2015  . Family history of breast cancer   . Breast cancer of upper-inner quadrant of right female breast ([name]) 10/28/2015  . Postoperative state 09/08/2014    Julie Riddles PT 12/28/2019, 2:04 PM PHYSICAL THERAPY DISCHARGE SUMMARY  Visits from Start of Care: 5  Current functional level related to goals / functional outcomes: See above for most current PT status.  Pt called to request D/C due to feeling better.     Remaining deficits: See above for most current PT status.     Education / Equipment: HEP Plan: Patient agrees to discharge.  Patient goals were partially met. Patient is  being discharged due to the patient's request.  ?????        Kelly Takacs, PT 01/05/20 12:00 PM  [facility] [address], [address] , [place], [address] Phone: [phone]   Fax:  [phone]  Name: Kaymarie Lynn Wiehe MRN: [id number] Date of Birth: 05/01/1965   

## 2019-12-28 NOTE — Patient Instructions (Addendum)

## 2019-12-30 ENCOUNTER — Encounter: Payer: No Typology Code available for payment source | Admitting: Physical Therapy

## 2020-01-04 ENCOUNTER — Encounter: Payer: No Typology Code available for payment source | Admitting: Physical Therapy

## 2020-01-06 ENCOUNTER — Encounter: Payer: No Typology Code available for payment source | Admitting: Physical Therapy

## 2020-01-11 ENCOUNTER — Encounter: Payer: No Typology Code available for payment source | Admitting: Physical Therapy

## 2020-01-18 ENCOUNTER — Encounter: Payer: No Typology Code available for payment source | Admitting: Physical Therapy

## 2020-01-19 ENCOUNTER — Other Ambulatory Visit (HOSPITAL_COMMUNITY): Payer: Self-pay | Admitting: Family Medicine

## 2020-01-19 MED FILL — PROPRANOLOL HCL ER 60 MG CP: 60 | 30 days supply | Qty: 30 | Fill #0

## 2020-01-20 ENCOUNTER — Encounter: Payer: No Typology Code available for payment source | Admitting: Physical Therapy

## 2020-02-16 ENCOUNTER — Other Ambulatory Visit (HOSPITAL_COMMUNITY): Payer: Self-pay | Admitting: Family Medicine

## 2020-02-16 MED FILL — PROPRANOLOL HCL ER 60 MG CP: 60 | 30 days supply | Qty: 30 | Fill #1

## 2020-02-16 MED FILL — VENLAFAXINE HCL ER 150 MG C: 150 | 90 days supply | Qty: 90 | Fill #0

## 2020-03-10 MED FILL — TAMOXIFEN 20 MG TABLET: 20 | 90 days supply | Qty: 90 | Fill #1

## 2020-03-22 ENCOUNTER — Other Ambulatory Visit (HOSPITAL_COMMUNITY): Payer: Self-pay | Admitting: Family Medicine

## 2020-03-22 MED FILL — PROPRANOLOL HCL ER 60 MG CP: 60 | 30 days supply | Qty: 30 | Fill #0

## 2020-03-29 ENCOUNTER — Other Ambulatory Visit (HOSPITAL_COMMUNITY): Payer: Self-pay | Admitting: Family Medicine

## 2020-03-29 MED FILL — SYNTHROID 112 MCG TABLET: 112 | 90 days supply | Qty: 90 | Fill #0

## 2020-04-06 ENCOUNTER — Other Ambulatory Visit (HOSPITAL_COMMUNITY): Payer: Self-pay | Admitting: Dentistry

## 2020-04-06 MED FILL — AMOXICILLIN 500 MG CAPSULE: 500 | 8 days supply | Qty: 25 | Fill #0

## 2020-04-25 MED FILL — PROPRANOLOL HCL ER 60 MG CP: 60 | 30 days supply | Qty: 30 | Fill #1

## 2020-05-13 ENCOUNTER — Other Ambulatory Visit (HOSPITAL_BASED_OUTPATIENT_CLINIC_OR_DEPARTMENT_OTHER): Payer: Self-pay

## 2020-05-17 ENCOUNTER — Other Ambulatory Visit: Payer: Self-pay | Admitting: Hematology and Oncology

## 2020-05-17 DIAGNOSIS — Z1231 Encounter for screening mammogram for malignant neoplasm of breast: Secondary | ICD-10-CM

## 2020-05-22 ENCOUNTER — Other Ambulatory Visit (HOSPITAL_COMMUNITY): Payer: Self-pay

## 2020-05-22 MED FILL — Venlafaxine HCl Cap ER 24HR 150 MG (Base Equivalent): ORAL | 90 days supply | Qty: 90 | Fill #0 | Status: AC

## 2020-05-23 ENCOUNTER — Other Ambulatory Visit (HOSPITAL_COMMUNITY): Payer: Self-pay

## 2020-05-25 ENCOUNTER — Other Ambulatory Visit (HOSPITAL_COMMUNITY): Payer: Self-pay

## 2020-05-26 ENCOUNTER — Other Ambulatory Visit (HOSPITAL_COMMUNITY): Payer: Self-pay

## 2020-05-26 MED ORDER — PROPRANOLOL HCL ER 60 MG PO CP24
ORAL_CAPSULE | ORAL | 1 refills | Status: DC
  Filled 2020-05-26 – 2020-06-03 (×2): qty 30, 30d supply, fill #0
  Filled 2020-07-01: qty 30, 30d supply, fill #1

## 2020-06-03 ENCOUNTER — Other Ambulatory Visit (HOSPITAL_COMMUNITY): Payer: Self-pay

## 2020-06-13 MED FILL — Tamoxifen Citrate Tab 20 MG (Base Equivalent): ORAL | 90 days supply | Qty: 90 | Fill #0 | Status: AC

## 2020-06-14 ENCOUNTER — Other Ambulatory Visit (HOSPITAL_COMMUNITY): Payer: Self-pay

## 2020-07-01 ENCOUNTER — Other Ambulatory Visit (HOSPITAL_COMMUNITY): Payer: Self-pay

## 2020-07-02 ENCOUNTER — Other Ambulatory Visit (HOSPITAL_COMMUNITY): Payer: Self-pay

## 2020-07-05 ENCOUNTER — Other Ambulatory Visit (HOSPITAL_COMMUNITY): Payer: Self-pay

## 2020-07-05 MED ORDER — LEVOTHYROXINE SODIUM 112 MCG PO TABS
ORAL_TABLET | ORAL | 0 refills | Status: DC
  Filled 2020-07-05: qty 30, 30d supply, fill #0

## 2020-07-07 ENCOUNTER — Ambulatory Visit: Payer: No Typology Code available for payment source

## 2020-08-02 ENCOUNTER — Other Ambulatory Visit (HOSPITAL_COMMUNITY): Payer: Self-pay

## 2020-08-03 ENCOUNTER — Other Ambulatory Visit (HOSPITAL_COMMUNITY): Payer: Self-pay

## 2020-08-03 MED ORDER — PROPRANOLOL HCL ER 60 MG PO CP24
ORAL_CAPSULE | ORAL | 0 refills | Status: DC
  Filled 2020-08-03: qty 15, 15d supply, fill #0

## 2020-08-03 MED ORDER — LEVOTHYROXINE SODIUM 112 MCG PO TABS
112.0000 ug | ORAL_TABLET | Freq: Every morning | ORAL | 0 refills | Status: DC
Start: 2020-08-03 — End: 2020-08-16
  Filled 2020-08-03: qty 30, 30d supply, fill #0

## 2020-08-10 ENCOUNTER — Other Ambulatory Visit: Payer: Self-pay

## 2020-08-10 ENCOUNTER — Ambulatory Visit
Admission: RE | Admit: 2020-08-10 | Discharge: 2020-08-10 | Disposition: A | Discharge status: Home / Self Care | Payer: 59 | Source: Ambulatory Visit | Attending: Hematology and Oncology | Admitting: Hematology and Oncology

## 2020-08-10 DIAGNOSIS — Z1231 Encounter for screening mammogram for malignant neoplasm of breast: Secondary | ICD-10-CM

## 2020-08-11 ENCOUNTER — Other Ambulatory Visit: Payer: Self-pay | Admitting: Hematology and Oncology

## 2020-08-11 DIAGNOSIS — R921 Mammographic calcification found on diagnostic imaging of breast: Secondary | ICD-10-CM

## 2020-08-11 DIAGNOSIS — Z853 Personal history of malignant neoplasm of breast: Secondary | ICD-10-CM

## 2020-08-16 ENCOUNTER — Other Ambulatory Visit (HOSPITAL_COMMUNITY): Payer: Self-pay

## 2020-08-16 DIAGNOSIS — Z853 Personal history of malignant neoplasm of breast: Secondary | ICD-10-CM | POA: Diagnosis not present

## 2020-08-16 DIAGNOSIS — F411 Generalized anxiety disorder: Secondary | ICD-10-CM | POA: Diagnosis not present

## 2020-08-16 DIAGNOSIS — E039 Hypothyroidism, unspecified: Secondary | ICD-10-CM | POA: Diagnosis not present

## 2020-08-16 DIAGNOSIS — Z6841 Body Mass Index (BMI) 40.0 and over, adult: Secondary | ICD-10-CM | POA: Diagnosis not present

## 2020-08-16 DIAGNOSIS — R7303 Prediabetes: Secondary | ICD-10-CM | POA: Diagnosis not present

## 2020-08-16 MED ORDER — VENLAFAXINE HCL ER 150 MG PO CP24
150.0000 mg | ORAL_CAPSULE | Freq: Every day | ORAL | 1 refills | Status: DC
  Filled 2020-08-16: qty 90, 90d supply, fill #0
  Filled 2020-12-07: qty 90, 90d supply, fill #1

## 2020-08-16 MED ORDER — PROPRANOLOL HCL ER 60 MG PO CP24
60.0000 mg | ORAL_CAPSULE | Freq: Every day | ORAL | 1 refills | Status: DC
  Filled 2020-08-16: qty 90, 90d supply, fill #0
  Filled 2020-12-07: qty 90, 90d supply, fill #1

## 2020-08-16 MED ORDER — LEVOTHYROXINE SODIUM 112 MCG PO TABS
ORAL_TABLET | ORAL | 1 refills | Status: DC
  Filled 2020-08-16 – 2020-09-04 (×2): qty 90, 90d supply, fill #0
  Filled 2020-12-14: qty 90, 90d supply, fill #1

## 2020-08-17 ENCOUNTER — Other Ambulatory Visit (HOSPITAL_COMMUNITY): Payer: Self-pay

## 2020-08-26 ENCOUNTER — Other Ambulatory Visit: Payer: Self-pay

## 2020-08-26 ENCOUNTER — Ambulatory Visit
Admission: RE | Admit: 2020-08-26 | Discharge: 2020-08-26 | Disposition: A | Discharge status: Home / Self Care | Payer: 59 | Source: Ambulatory Visit | Attending: Hematology and Oncology | Admitting: Hematology and Oncology

## 2020-08-26 ENCOUNTER — Other Ambulatory Visit: Payer: Self-pay | Admitting: Hematology and Oncology

## 2020-08-26 DIAGNOSIS — R922 Inconclusive mammogram: Secondary | ICD-10-CM | POA: Diagnosis not present

## 2020-08-26 DIAGNOSIS — Z853 Personal history of malignant neoplasm of breast: Secondary | ICD-10-CM

## 2020-08-26 DIAGNOSIS — R921 Mammographic calcification found on diagnostic imaging of breast: Secondary | ICD-10-CM

## 2020-08-29 ENCOUNTER — Other Ambulatory Visit (HOSPITAL_COMMUNITY): Payer: Self-pay

## 2020-08-30 ENCOUNTER — Other Ambulatory Visit (HOSPITAL_COMMUNITY): Payer: Self-pay

## 2020-09-02 ENCOUNTER — Other Ambulatory Visit (HOSPITAL_COMMUNITY): Payer: Self-pay

## 2020-09-05 ENCOUNTER — Other Ambulatory Visit (HOSPITAL_COMMUNITY): Payer: Self-pay

## 2020-09-05 MED ORDER — ALBUTEROL SULFATE HFA 108 (90 BASE) MCG/ACT IN AERS
INHALATION_SPRAY | RESPIRATORY_TRACT | 1 refills | Status: AC
  Filled 2020-09-05: qty 18, 25d supply, fill #0

## 2020-09-08 DIAGNOSIS — F411 Generalized anxiety disorder: Secondary | ICD-10-CM | POA: Diagnosis not present

## 2020-09-08 DIAGNOSIS — R7303 Prediabetes: Secondary | ICD-10-CM | POA: Diagnosis not present

## 2020-09-08 DIAGNOSIS — Z853 Personal history of malignant neoplasm of breast: Secondary | ICD-10-CM | POA: Diagnosis not present

## 2020-09-08 DIAGNOSIS — E039 Hypothyroidism, unspecified: Secondary | ICD-10-CM | POA: Diagnosis not present

## 2020-09-08 DIAGNOSIS — Z6841 Body Mass Index (BMI) 40.0 and over, adult: Secondary | ICD-10-CM | POA: Diagnosis not present

## 2020-09-21 MED FILL — Tamoxifen Citrate Tab 20 MG (Base Equivalent): ORAL | 90 days supply | Qty: 90 | Fill #1 | Status: AC

## 2020-09-22 ENCOUNTER — Other Ambulatory Visit (HOSPITAL_COMMUNITY): Payer: Self-pay

## 2020-09-28 NOTE — Progress Notes (Signed)
Patient Care Team: Holland Commons, FNP as PCP - General (Internal Medicine) Erroll Luna, MD as Consulting Physician (General Surgery) Nicholas Lose, MD as Consulting Physician (Hematology and Oncology) Gery Pray, MD as Consulting Physician (Radiation Oncology) Gardenia Phlegm, NP as Nurse Practitioner (Hematology and Oncology)  DIAGNOSIS:    ICD-10-CM   1. Malignant neoplasm of upper-inner quadrant of right breast in female, estrogen receptor positive ([place])  C50.211    Z17.0       SUMMARY OF ONCOLOGIC HISTORY: Oncology History  Breast cancer of upper-inner quadrant of right female breast ([place])  10/27/2015 Initial Diagnosis   Screening detected right breast calcifications spanning 4 mm, biopsy intermediate grade DCIS with small focus of invasive ductal carcinoma ER 95%, PR 95%, HER-2 negative ratio 1.16, Ki-67 2%, T1a N0 stage IA   11/09/2015 Genetic Testing   Genetic testing was normal, and did not reveal a deleterious mutation.  Genes tested:  ATM, BARD1, BRCA1, BRCA2, BRIP1, CDH1, CHEK2, EPCAM, FANCC, MLH1, MSH2, MSH6, NBN, PALB2, PMS2, PTEN, RAD51C, RAD51D, TP53, and XRCC2.     12/08/2015 Surgery   Right lumpectomy ([place]): IDC grade 1, 0.6 cm, DCIS with calcifications, intermediate grade, margins negative, 0/7 lymph nodes negative, ER 95%, PR 95%, HER-2 negative ratio 1.16, Ki-67 2%, T1 BN 0 stage IA   01/19/2016 - 03/07/2016 Radiation Therapy   Adjuvant radiation ([place]):1) Right breast: 50.4 Gy in 28 fractions.  2) Right breast boost: 10 Gy in 5 fractions.   03/12/2016 -  Anti-estrogen oral therapy   Tamoxifen 87m daily.  Planned treatment duration: 5 years.     CHIEF COMPLIANT: Follow-up of right breast cancer on tamoxifen therapy  INTERVAL HISTORY: Marie Odwyeris a 55y.o. with above-mentioned history of right breast cancer treated with lumpectomy, radiation, and who is currently on tamoxifen. Mammogram on 08/26/20 new likely early  dystrophic calcifications along the anterior margin of the right breast lumpectomy site. She presents to the clinic today for follow-up.   ALLERGIES:  is allergic to no known allergies.  MEDICATIONS:  Current Outpatient Medications  Medication Sig Dispense Refill   albuterol (PROAIR HFA) 108 (90 Base) MCG/ACT inhaler Inhale 2 puffs by mouth every 6 hours as needed for cough/wheezing 18 g 1   ALPRAZolam (XANAX) 0.5 MG tablet Take 0.5 mg by mouth daily as needed for anxiety.     amoxicillin (AMOXIL) 500 MG capsule TAKE 1 CAPSULE BY MOUTH 3 TIMES DAILY UNTIL GONE 25 capsule 0   EFFEXOR XR 150 MG 24 hr capsule Take 150 mg by mouth daily.     guaiFENesin-dextromethorphan (ROBITUSSIN DM) 100-10 MG/5ML syrup Take 10 mLs by mouth every 6 (six) hours as needed for cough. 118 mL 0   ibuprofen (ADVIL,MOTRIN) 200 MG tablet Take 400-800 mg by mouth every 6 (six) hours as needed for cramping.     levothyroxine (SYNTHROID) 112 MCG tablet TAKE 1 TABLET BY MOUTH ON AN EMPTY STOMACH IN THE MORNING 90 tablet 0   levothyroxine (SYNTHROID) 112 MCG tablet TAKE 1 TABLET BY MOUTH ONCE DAILY IN THE MORNING ON AN EMPTY STOMACH 90 tablet 1   levothyroxine (SYNTHROID) 112 MCG tablet Take 1 tablet by mouth in the morning on an empty stomach daily 90 tablet 1   loratadine (CLARITIN) 10 MG tablet Take 10 mg by mouth daily.     Multiple Vitamins-Minerals (MULTIVITAMIN PO) Take 1 tablet by mouth daily.     propranolol ER (INDERAL LA) 60 MG 24 hr capsule Take 60  mg by mouth daily.     propranolol ER (INDERAL LA) 60 MG 24 hr capsule TAKE 1 CAPSULE BY MOUTH DAILY 30 capsule 1   propranolol ER (INDERAL LA) 60 MG 24 hr capsule TAKE 1 CAPSULE BY MOUTH ONCE DAILY 30 capsule 1   propranolol ER (INDERAL LA) 60 MG 24 hr capsule TAKE 1 CAPSULE BY MOUTH DAILY 30 capsule 0   propranolol ER (INDERAL LA) 60 MG 24 hr capsule Take 1 capsule by mouth daily. 90 capsule 1   SYNTHROID 112 MCG tablet Take 112 mcg by mouth daily.     tamoxifen  (NOLVADEX) 20 MG tablet TAKE 1 TABLET BY MOUTH ONCE DAILY 90 tablet 3   venlafaxine XR (EFFEXOR-XR) 150 MG 24 hr capsule Take 1 capsule by mouth daily with food 90 capsule 1   No current facility-administered medications for this visit.    PHYSICAL EXAMINATION: ECOG PERFORMANCE STATUS: 1 - Symptomatic but completely ambulatory  Vitals:   09/29/20 1519  BP: 131/80  Pulse: 84  Resp: 18  Temp: (!) 97.5 F (36.4 C)  SpO2: 95%   Filed Weights   09/29/20 1519  Weight: (!) 351 lb 12.8 oz (159.6 kg)    BREAST: No palpable masses or nodules in either right or left breasts. No palpable axillary supraclavicular or infraclavicular adenopathy no breast tenderness or nipple discharge. (exam performed in the presence of a chaperone)  LABORATORY DATA:  I have reviewed the data as listed CMP Latest Ref Rng & Units 02/23/2019 02/22/2019 02/21/2019  Glucose 70 - 99 mg/dL 194(H) 179(H) 113(H)  BUN 6 - 20 mg/dL '12 11 10  ' Creatinine 0.44 - 1.00 mg/dL 0.69 0.89 0.87  Sodium 135 - 145 mmol/L 139 137 136  Potassium 3.5 - 5.1 mmol/L 4.1 3.8 3.6  Chloride 98 - 111 mmol/L 107 104 103  CO2 22 - 32 mmol/L '24 24 23  ' Calcium 8.9 - 10.3 mg/dL 8.7(L) 8.4(L) 8.4(L)  Total Protein 6.5 - 8.1 g/dL 6.6 6.6 6.9  Total Bilirubin 0.3 - 1.2 mg/dL 0.3 0.6 0.4  Alkaline Phos 38 - 126 U/L 56 60 62  AST 15 - 41 U/L 44(H) 59(H) 61(H)  ALT 0 - 44 U/L 46(H) 49(H) 49(H)    Lab Results  Component Value Date   WBC 4.1 02/23/2019   HGB 13.2 02/23/2019   HCT 41.6 02/23/2019   MCV 90.4 02/23/2019   PLT 172 02/23/2019   NEUTROABS 3.3 02/23/2019    ASSESSMENT & PLAN:  Breast cancer of upper-inner quadrant of right female breast ([place]) Right lumpectomy 12/08/2015: IDC grade 1, 0.6 cm, DCIS with calcifications, intermediate grade, margins negative, 0/7 lymph nodes negative, ER 95%, PR 95%, HER-2 negative ratio 1.16, Ki-67 2%, T1 BN 0 stage IA Adjuvant radiation: 01/19/2016 to 03/07/2016   Treatment plan: Antiestrogen therapy  with tamoxifen 20 mg daily started 04/12/2016 We will finish 5 years of therapy by December 2022 She will stop it at that time.   Tamoxifen toxicities: Mild to moderate hot flashes   Breast cancer surveillance: 1.  08/26/2020: Mammogram,  right breast dystrophic calcifications at the lumpectomy site, 40-monthmammogram recommended, density category C. 2.   09/29/2020: Breast exam: Benign    Return to clinic on an as-needed basis      No orders of the defined types were placed in this encounter.  The patient has a good understanding of the overall plan. she agrees with it. she will call with any problems that may develop before the next  visit here.  Total time spent: 20 mins including face to face time and time spent for planning, charting and coordination of care  Rulon Eisenmenger, MD, MPH 09/29/2020  I, Thana Ates, am acting as scribe for Dr. Nicholas Lose.  I have reviewed the above documentation for accuracy and completeness, and I agree with the above.

## 2020-09-29 ENCOUNTER — Other Ambulatory Visit: Payer: Self-pay

## 2020-09-29 ENCOUNTER — Inpatient Hospital Stay: Payer: 59 | Attending: Hematology and Oncology | Admitting: Hematology and Oncology

## 2020-09-29 DIAGNOSIS — C50211 Malignant neoplasm of upper-inner quadrant of right female breast: Secondary | ICD-10-CM | POA: Insufficient documentation

## 2020-09-29 DIAGNOSIS — Z170 Estrogen receptor positive status [ER+]: Secondary | ICD-10-CM | POA: Diagnosis not present

## 2020-09-29 DIAGNOSIS — Z79899 Other long term (current) drug therapy: Secondary | ICD-10-CM | POA: Diagnosis not present

## 2020-09-29 DIAGNOSIS — R232 Flushing: Secondary | ICD-10-CM | POA: Diagnosis not present

## 2020-09-29 NOTE — Assessment & Plan Note (Signed)
Right lumpectomy 12/08/2015: IDC grade 1, 0.6 cm, DCIS with calcifications, intermediate grade, margins negative, 0/7 lymph nodes negative, ER 95%, PR 95%, HER-2 negative ratio 1.16, Ki-67 2%, T1 BN 0 stage IA Adjuvant radiation: 11/30/2017to01/17/2018  Treatment plan:Antiestrogen therapy with tamoxifen 20 mg dailystarted 04/12/2016 We will finish 5 years of therapy by December 2022 She will stop it at that time.  Tamoxifentoxicities: Severe hot flashes.  In spite of 150 mg of Effexor.  Breast cancer surveillance: 1.08/26/2020: Mammogram,  right breast dystrophic calcifications at the lumpectomy site, 78-monthmammogram recommended, density category C. 2. 09/29/2020: Breast exam: Benign   Return to clinic in1year

## 2020-11-03 ENCOUNTER — Other Ambulatory Visit (HOSPITAL_COMMUNITY): Payer: Self-pay

## 2020-11-03 DIAGNOSIS — L237 Allergic contact dermatitis due to plants, except food: Secondary | ICD-10-CM | POA: Diagnosis not present

## 2020-11-03 DIAGNOSIS — Z6841 Body Mass Index (BMI) 40.0 and over, adult: Secondary | ICD-10-CM | POA: Diagnosis not present

## 2020-11-03 MED ORDER — PREDNISONE 10 MG (21) PO TBPK
ORAL_TABLET | ORAL | 0 refills | Status: DC
  Filled 2020-11-03: qty 21, 6d supply, fill #0

## 2020-11-03 MED ORDER — TRIAMCINOLONE ACETONIDE 0.5 % EX CREA
TOPICAL_CREAM | CUTANEOUS | 0 refills | Status: DC
  Filled 2020-11-03: qty 15, 5d supply, fill #0

## 2020-11-03 MED ORDER — HYDROXYZINE HCL 25 MG PO TABS
ORAL_TABLET | ORAL | 0 refills | Status: DC
  Filled 2020-11-03: qty 20, 10d supply, fill #0

## 2020-11-05 DIAGNOSIS — Z0289 Encounter for other administrative examinations: Secondary | ICD-10-CM

## 2020-11-10 ENCOUNTER — Encounter (INDEPENDENT_AMBULATORY_CARE_PROVIDER_SITE_OTHER): Payer: Self-pay | Admitting: Family Medicine

## 2020-11-10 ENCOUNTER — Ambulatory Visit (INDEPENDENT_AMBULATORY_CARE_PROVIDER_SITE_OTHER): Payer: 59 | Admitting: Family Medicine

## 2020-11-10 ENCOUNTER — Other Ambulatory Visit: Payer: Self-pay

## 2020-11-10 VITALS — BP 107/71 | HR 67 | Temp 98.1°F | Ht 66.0 in | Wt 346.0 lb

## 2020-11-10 DIAGNOSIS — Z1331 Encounter for screening for depression: Secondary | ICD-10-CM

## 2020-11-10 DIAGNOSIS — R0602 Shortness of breath: Secondary | ICD-10-CM

## 2020-11-10 DIAGNOSIS — R5383 Other fatigue: Secondary | ICD-10-CM

## 2020-11-10 DIAGNOSIS — Z803 Family history of malignant neoplasm of breast: Secondary | ICD-10-CM | POA: Diagnosis not present

## 2020-11-10 DIAGNOSIS — E039 Hypothyroidism, unspecified: Secondary | ICD-10-CM

## 2020-11-10 DIAGNOSIS — Z9189 Other specified personal risk factors, not elsewhere classified: Secondary | ICD-10-CM | POA: Diagnosis not present

## 2020-11-10 DIAGNOSIS — F411 Generalized anxiety disorder: Secondary | ICD-10-CM

## 2020-11-10 DIAGNOSIS — E1169 Type 2 diabetes mellitus with other specified complication: Secondary | ICD-10-CM | POA: Diagnosis not present

## 2020-11-10 DIAGNOSIS — F32A Depression, unspecified: Secondary | ICD-10-CM

## 2020-11-10 DIAGNOSIS — E782 Mixed hyperlipidemia: Secondary | ICD-10-CM

## 2020-11-10 DIAGNOSIS — E119 Type 2 diabetes mellitus without complications: Secondary | ICD-10-CM | POA: Insufficient documentation

## 2020-11-10 DIAGNOSIS — Z6841 Body Mass Index (BMI) 40.0 and over, adult: Secondary | ICD-10-CM

## 2020-11-11 NOTE — Progress Notes (Signed)
Dear Dr. Radene Wilson,   Thank you for referring Marie Wilson to our clinic. The following note includes my evaluation and treatment recommendations.  Chief Complaint:   OBESITY Marie Wilson (MR# [id number]) is a 55 y.o. female who presents for evaluation and treatment of obesity and related comorbidities. Current BMI is Body mass index is 55.85 kg/m. Marie Wilson has been struggling with her weight for many years and has been unsuccessful in either losing weight, maintaining weight loss, or reaching her healthy weight goal.  Marie Wilson is currently in the action stage of change and ready to dedicate time achieving and maintaining a healthier weight. Marie Wilson is interested in becoming our patient and working on intensive lifestyle modifications including (but not limited to) diet and exercise for weight loss.  Marie Wilson works full-time from home and lives with her 77 year old son. She craves carbs/sugary foods. Pt dislikes fish. Her worst habits are candy and soda. Pt drinks 2-3 two liter Pepsis a week, and is not sure she can give them up. PCP is Dr. Radene Wilson with Marie Wilson.  Marie Wilson's habits were reviewed today and are as follows: her desired weight loss is 166 lbs, she has been heavy most of her life, she started gaining weight in her 30's, her heaviest weight ever was 346 pounds, she is a picky eater and doesn't like to eat healthier foods, she has significant food cravings issues, she skips meals frequently, she is frequently drinking liquids with calories, she frequently makes poor food choices, she has problems with excessive hunger, she frequently eats larger portions than normal, she has binge eating behaviors, and she struggles with emotional eating.  Depression Screen Marie Wilson's Food and Mood (modified PHQ-9) score was 15.  Depression screen [facility] 2/9 11/10/2020  Decreased Interest 2  Down, Depressed, Hopeless 1  PHQ - 2 Score 3  Altered sleeping 2  Tired, decreased energy 3  Change in  appetite 2  Feeling bad or failure about yourself  2  Trouble concentrating 0  Moving slowly or fidgety/restless 2  Suicidal thoughts 1  PHQ-9 Score 15  Difficult doing work/chores Not difficult at all   Subjective:   1. Other fatigue [name] admits to daytime somnolence and admits to waking up still tired. Patent has a history of symptoms of daytime fatigue and morning fatigue. Marie Wilson generally gets 6 hours of sleep per night, and states that she has poor sleep quality. Snoring is present. Apneic episodes are "possibly" present. Epworth Sleepiness Score is 11.  2. Shortness of breath on exertion Marie Wilson notes increasing shortness of breath with exercising and seems to be worsening over time with weight gain. She notes getting out of breath sooner with activity than she used to. This has gotten worse recently. [name] denies shortness of breath at rest or orthopnea.  3. Type 2 diabetes mellitus with other specified complication, without long-term current use of insulin ([name])- diet controlled currently A few years ago, pt's A1c was over 7.0, and more recently it was 6.2.  4. Hypothyroidism, unspecified type Marie Wilson takes Synthroid 112 mcg.   5. GAD (generalized anxiety disorder) Pt does not currently have a counselor, and she denies a need for one. Medication: Propranolol, Effexor  6. Family history of breast cancer Marie Wilson takes tamoxifen.  7. Depression- emotional eating PHQ = 15. Pt denies suicidal or homicidal ideations.  8. Mixed diabetic hyperlipidemia associated with type 2 diabetes mellitus ([place]) Medication: None  9. At risk for heart disease [name] is at higher than  average risk for developing diabetes due to untreated diabetes mellitus and hyperlipidemia.   Assessment/Plan:   1. Other fatigue [name] does feel that her weight is causing her energy to be lower than it should be. Fatigue may be related to obesity, depression or many other causes. Labs will be ordered, and in  the meanwhile, [name] will focus on self care including making healthy food choices, increasing physical activity and focusing on stress reduction. Pt advised to follow up with PCP regarding possible sleep apnea evaluation. Check labs today.  - Vitamin B12 - EKG 12-Lead - Folate - VITAMIN D 25 Hydroxy (Vit-D Deficiency, Fractures)  2. Shortness of breath on exertion [name] does feel that she gets out of breath more easily that she used to when she exercises. [name]'s shortness of breath appears to be obesity related and exercise induced. She has agreed to work on weight loss and gradually increase exercise to treat her exercise induced shortness of breath. Will continue to monitor closely. Pt advised to follow up with PCP regarding possible sleep apnea evaluation.  3. Type 2 diabetes mellitus with other specified complication, without long-term current use of insulin ([name])- diet controlled currently Good blood sugar control is important to decrease the likelihood of diabetic complications such as nephropathy, neuropathy, limb loss, blindness, coronary artery disease, and death. Intensive lifestyle modification including diet, exercise and weight loss are the first line of treatment for diabetes. Check labs today.  - Hemoglobin A1c - Insulin, random  4. Hypothyroidism, unspecified type Patient with long-standing hypothyroidism, on levothyroxine therapy. She appears euthyroid. Orders and follow up as documented in patient record.  Counseling Good thyroid control is important for overall health. Supratherapeutic thyroid levels are dangerous and will not improve weight loss results. The correct way to take levothyroxine is fasting, with water, separated by at least 30 minutes from breakfast, and separated by more than 4 hours from calcium, iron, multivitamins, acid reflux medications (PPIs).  Check labs today.  - T4, free - TSH  5. GAD (generalized anxiety disorder) Behavior modification  techniques were discussed today to help [name] deal with her anxiety.  Orders and follow up as documented in patient record. Continue current treatment plan.  6. Family history of breast cancer Continue Tamoxifen.  7. Depression- emotional eating Behavior modification techniques were discussed today to help Marie Wilson deal with her emotional/non-hunger eating behaviors.  Orders and follow up as documented in patient record. Refer to Dr. Mallie Mussel. Pt states Effexor is not as effective as prior mood medications, but will be able to change after tamoxifen treatment.  8. Mixed diabetic hyperlipidemia associated with type 2 diabetes mellitus ([place]) Cardiovascular risk and specific lipid/LDL goals reviewed.  We discussed several lifestyle modifications today and Barrie will continue to work on prudent nutritional plan, exercise and weight loss efforts. Orders and follow up as documented in patient record.   Counseling Intensive lifestyle modifications are the first line treatment for this issue. Dietary changes: Increase soluble fiber. Decrease simple carbohydrates. Exercise changes: Moderate to vigorous-intensity aerobic activity 150 minutes per week if tolerated. Lipid-lowering medications: see documented in medical record. Check labs today.  - CBC with Differential/Platelet - Comprehensive metabolic panel - Lipid Panel With LDL/HDL Ratio  9. At risk for heart disease Marie Wilson was given approximately 23 minutes of coronary artery disease prevention counseling today. She is 56 y.o. female and has risk factors for heart disease including obesity. We discussed intensive lifestyle modifications today with an emphasis on specific weight loss instructions and strategies.  Repetitive spaced learning was employed today to elicit superior memory formation and behavioral change.  10. Obesity with current BMI of 55.9  Marie Wilson is currently in the action stage of change and her goal is to continue with weight  loss efforts. I recommend Marie Wilson begin the structured treatment plan as follows:  She has agreed to the Category 3 Plan.  Exercise goals:  As is    Behavioral modification strategies: planning for success.  She was informed of the importance of frequent follow-up visits to maximize her success with intensive lifestyle modifications for her multiple health conditions. She was informed we would discuss her lab results at her next visit unless there is a critical issue that needs to be addressed sooner. Marie Wilson agreed to keep her next visit at the agreed upon time to discuss these results.  Objective:   Blood pressure 107/71, pulse 67, temperature 98.1 F (36.7 C), height 5\' 6"  (1.676 m), weight (!) 346 lb (156.9 kg), SpO2 95 %. Body mass index is 55.85 kg/m.  EKG: Normal sinus rhythm, rate 74.  Indirect Calorimeter completed today shows a VO2 of 321 and a REE of 2218.  Her calculated basal metabolic rate is 1696 thus her basal metabolic rate is better than expected.  General: Cooperative, alert, well developed, in no acute distress. HEENT: Conjunctivae and lids unremarkable. Cardiovascular: Regular rhythm.  Lungs: Normal work of breathing. Neurologic: No focal deficits.   Lab Results  Component Value Date   CREATININE 0.89 11/10/2020   BUN 14 11/10/2020   NA 140 11/10/2020   K 3.9 11/10/2020   CL 98 11/10/2020   CO2 25 11/10/2020   Lab Results  Component Value Date   ALT 47 (H) 11/10/2020   AST 64 (H) 11/10/2020   ALKPHOS 91 11/10/2020   BILITOT 0.5 11/10/2020   Lab Results  Component Value Date   HGBA1C 6.3 (H) 11/10/2020   HGBA1C 6.0 (H) 02/22/2019   HGBA1C 5.8 (H) 12/08/2015   Lab Results  Component Value Date   INSULIN WILL FOLLOW 11/10/2020   Lab Results  Component Value Date   TSH 3.110 11/10/2020   Lab Results  Component Value Date   CHOL 158 11/10/2020   HDL 48 11/10/2020   LDLCALC 79 11/10/2020   TRIG 182 (H) 11/10/2020   Lab Results  Component  Value Date   WBC 11.6 (H) 11/10/2020   HGB 15.4 11/10/2020   HCT 47.0 (H) 11/10/2020   MCV 89 11/10/2020   PLT 349 11/10/2020   Lab Results  Component Value Date   FERRITIN 508 (H) 02/21/2019    Attestation Statements:   Reviewed by clinician on day of visit: allergies, medications, problem list, medical history, surgical history, family history, social history, and previous encounter notes.  Coral Ceo, CMA, am acting as transcriptionist for [employer], DO.  I have reviewed the above documentation for accuracy and completeness, and I agree with the above. Marjory Sneddon, D.O.  The [place] was signed into law in 2016 which includes the topic of electronic health records.  This provides immediate access to information in MyChart.  This includes consultation notes, operative notes, office notes, lab results and pathology reports.  If you have any questions about what you read please let us know at your next visit so we can discuss your concerns and take corrective action if need be.  We are right here with you.

## 2020-11-12 LAB — COMPREHENSIVE METABOLIC PANEL
ALT: 47 IU/L — ABNORMAL HIGH (ref 0–32)
AST: 64 IU/L — ABNORMAL HIGH (ref 0–40)
Albumin/Globulin Ratio: 1.4 (ref 1.2–2.2)
Albumin: 4 g/dL (ref 3.8–4.9)
Alkaline Phosphatase: 91 IU/L (ref 44–121)
BUN/Creatinine Ratio: 16 (ref 9–23)
BUN: 14 mg/dL (ref 6–24)
Bilirubin Total: 0.5 mg/dL (ref 0.0–1.2)
CO2: 25 mmol/L (ref 20–29)
Calcium: 9.2 mg/dL (ref 8.7–10.2)
Chloride: 98 mmol/L (ref 96–106)
Creatinine, Ser: 0.89 mg/dL (ref 0.57–1.00)
Globulin, Total: 2.8 g/dL (ref 1.5–4.5)
Glucose: 104 mg/dL — ABNORMAL HIGH (ref 65–99)
Potassium: 3.9 mmol/L (ref 3.5–5.2)
Sodium: 140 mmol/L (ref 134–144)
Total Protein: 6.8 g/dL (ref 6.0–8.5)
eGFR: 77 mL/min/{1.73_m2} (ref >59)

## 2020-11-12 LAB — CBC WITH DIFFERENTIAL/PLATELET
Basophils Absolute: 0.1 10*3/uL (ref 0.0–0.2)
Basos: 1 %
EOS (ABSOLUTE): 0.4 10*3/uL (ref 0.0–0.4)
Eos: 3 %
Hematocrit: 47 % — ABNORMAL HIGH (ref 34.0–46.6)
Hemoglobin: 15.4 g/dL (ref 11.1–15.9)
Immature Grans (Abs): 0.1 10*3/uL (ref 0.0–0.1)
Immature Granulocytes: 1 %
Lymphocytes Absolute: 2.9 10*3/uL (ref 0.7–3.1)
Lymphs: 25 %
MCH: 29.2 pg (ref 26.6–33.0)
MCHC: 32.8 g/dL (ref 31.5–35.7)
MCV: 89 fL (ref 79–97)
Monocytes Absolute: 0.5 10*3/uL (ref 0.1–0.9)
Monocytes: 5 %
Neutrophils Absolute: 7.7 10*3/uL — ABNORMAL HIGH (ref 1.4–7.0)
Neutrophils: 65 %
Platelets: 349 10*3/uL (ref 150–450)
RBC: 5.27 x10E6/uL (ref 3.77–5.28)
RDW: 13.1 % (ref 11.7–15.4)
WBC: 11.6 10*3/uL — ABNORMAL HIGH (ref 3.4–10.8)

## 2020-11-12 LAB — HEMOGLOBIN A1C
Est. average glucose Bld gHb Est-mCnc: 134 mg/dL
Hgb A1c MFr Bld: 6.3 % — ABNORMAL HIGH (ref 4.8–5.6)

## 2020-11-12 LAB — LIPID PANEL WITH LDL/HDL RATIO
Cholesterol, Total: 158 mg/dL (ref 100–199)
HDL: 48 mg/dL (ref >39)
LDL Chol Calc (NIH): 79 mg/dL (ref 0–99)
LDL/HDL Ratio: 1.6 ratio (ref 0.0–3.2)
Triglycerides: 182 mg/dL — ABNORMAL HIGH (ref 0–149)
VLDL Cholesterol Cal: 31 mg/dL (ref 5–40)

## 2020-11-12 LAB — FOLATE: Folate: 15.6 ng/mL (ref >3.0)

## 2020-11-12 LAB — T4, FREE: Free T4: 1.51 ng/dL (ref 0.82–1.77)

## 2020-11-12 LAB — VITAMIN D 25 HYDROXY (VIT D DEFICIENCY, FRACTURES): Vit D, 25-Hydroxy: 26.9 ng/mL — ABNORMAL LOW (ref 30.0–100.0)

## 2020-11-12 LAB — VITAMIN B12: Vitamin B-12: 389 pg/mL (ref 232–1245)

## 2020-11-12 LAB — TSH: TSH: 3.11 u[IU]/mL (ref 0.450–4.500)

## 2020-11-12 LAB — INSULIN, RANDOM: INSULIN: 18.2 u[IU]/mL (ref 2.6–24.9)

## 2020-11-23 ENCOUNTER — Ambulatory Visit (INDEPENDENT_AMBULATORY_CARE_PROVIDER_SITE_OTHER): Payer: 59 | Admitting: Family Medicine

## 2020-11-23 ENCOUNTER — Other Ambulatory Visit: Payer: Self-pay

## 2020-11-23 ENCOUNTER — Other Ambulatory Visit (HOSPITAL_COMMUNITY): Payer: Self-pay

## 2020-11-23 ENCOUNTER — Encounter (INDEPENDENT_AMBULATORY_CARE_PROVIDER_SITE_OTHER): Payer: Self-pay | Admitting: Family Medicine

## 2020-11-23 VITALS — BP 133/84 | HR 69 | Temp 97.4°F | Ht 66.0 in | Wt 345.0 lb

## 2020-11-23 DIAGNOSIS — Z9189 Other specified personal risk factors, not elsewhere classified: Secondary | ICD-10-CM

## 2020-11-23 DIAGNOSIS — E1169 Type 2 diabetes mellitus with other specified complication: Secondary | ICD-10-CM | POA: Diagnosis not present

## 2020-11-23 DIAGNOSIS — K760 Fatty (change of) liver, not elsewhere classified: Secondary | ICD-10-CM | POA: Diagnosis not present

## 2020-11-23 DIAGNOSIS — F411 Generalized anxiety disorder: Secondary | ICD-10-CM | POA: Diagnosis not present

## 2020-11-23 DIAGNOSIS — E782 Mixed hyperlipidemia: Secondary | ICD-10-CM

## 2020-11-23 DIAGNOSIS — Z6841 Body Mass Index (BMI) 40.0 and over, adult: Secondary | ICD-10-CM

## 2020-11-23 DIAGNOSIS — E559 Vitamin D deficiency, unspecified: Secondary | ICD-10-CM | POA: Diagnosis not present

## 2020-11-23 DIAGNOSIS — E038 Other specified hypothyroidism: Secondary | ICD-10-CM

## 2020-11-23 MED ORDER — VITAMIN D (ERGOCALCIFEROL) 1.25 MG (50000 UNIT) PO CAPS
50000.0000 [IU] | ORAL_CAPSULE | ORAL | 0 refills | Status: DC
  Filled 2020-11-23: qty 4, 28d supply, fill #0

## 2020-11-24 NOTE — Progress Notes (Signed)
Chief Complaint:   OBESITY Marie Wilson is here to discuss her progress with her obesity treatment plan along with follow-up of her obesity related diagnoses. Marie Wilson is on the Category 3 Plan and states she is following her eating plan approximately 20% of the time. Marie Wilson states she is not currently exercising.  Today's visit was #: 2 Starting weight: 346 lbs Starting date: 11/10/2020 Today's weight: 345 lbs Today's date: 11/23/2020 Total lbs lost to date: 1 Total lbs lost since last in-office visit: 1  Interim History: Marie Wilson is here today for her first follow-up office visit since starting the program with [place].  All blood work/ lab tests that were recently ordered by myself or an outside provider were reviewed with patient today per their request.   Extended time was spent counseling her on all new disease processes that were discovered or preexisting ones that are affected by BMI.  she understands that many of these abnormalities will need to monitored regularly along with the current treatment plan of prudent dietary changes, in which we are making each and every office visit, to improve these health parameters. Marie Wilson states she wasn't able to really follow meal plan due to stressors in life. Her dad went into Hospice in [place], while pt stayed here in [place].  Subjective:   1. Type 2 diabetes mellitus with other specified complication, without long-term current use of insulin (Marie Wilson) Discussed labs with patient today. Managed by PCP. Marie Wilson is  not on medication and is not checking blood sugars at home, as she feels she doesn't need to. Her A1c is 6.3.  2. Mixed diabetic hyperlipidemia associated with type 2 diabetes mellitus (Marie Wilson) Discussed labs with patient today. Managed by PCP. Pt is not on statin therapy. Marie Wilson has hyperlipidemia and has been trying to improve her cholesterol levels with intensive lifestyle modification including a low saturated fat diet, exercise and  weight loss. She denies any chest pain, claudication or myalgias.  Lab Results  Component Value Date   ALT 47 (H) 11/10/2020   AST 64 (H) 11/10/2020   ALKPHOS 91 11/10/2020   BILITOT 0.5 11/10/2020   Lab Results  Component Value Date   CHOL 158 11/10/2020   HDL 48 11/10/2020   LDLCALC 79 11/10/2020   TRIG 182 (H) 11/10/2020   3. Other specified hypothyroidism Discussed labs with patient today. Labs within normal limits. ECG is within normal limits. Managed by PCP. Marie Wilson has no concerns. Medication: Synthroid 112 mcg  4. GAD (generalized anxiety disorder), with emotional eating Discussed labs with patient today. Marie Wilson did more emotional eating over the past few weeks due to stressors. She was referred to Marie Wilson last [place] but pt has yet to make an appt with her. She has no appt with her mood counselor either due to no time to do so.  5. NAFLD (nonalcoholic fatty liver disease) New. Discussed labs with patient today. Marie Wilson denies alcohol use at all. She was never told she had fatty liver in the past, but ALT and AST were elevated slightly in prior labs.   6. Vitamin D deficiency New. Discussed labs with patient today. Marie Wilson reports fatigue and generalized muscle aches.   7. At risk for heart disease Marie Wilson is at a higher than average risk for cardiovascular disease due to hyperlipidemia and diabetes.   Assessment/Plan:  No orders of the defined types were placed in this encounter.   There are no discontinued medications.   Meds ordered this encounter  Medications   Vitamin D, Ergocalciferol, (DRISDOL) 1.25 MG (50000 UNIT) CAPS capsule    Sig: Take 1 capsule (50,000 Units total) by mouth every 7 (seven) days.    Dispense:  4 capsule    Refill:  0    30 d supply;  ** OV for RF **   Do not send RF request     1. Type 2 diabetes mellitus with other specified complication, without long-term current use of insulin (Marie Wilson) Pt advised to discuss with PCP if a glucometer is  needed or not. Recommend first line treatment is healthier eating. Follow meal plan and we will consider meds in the future to help with weight loss.  2. Mixed diabetic hyperlipidemia associated with type 2 diabetes mellitus (Marie Wilson) Cardiovascular risk and specific lipid/LDL goals reviewed.  We discussed several lifestyle modifications today and Marie Wilson will continue to work on diet, exercise and weight loss efforts. Orders and follow up as documented in patient record. Pt advised to discuss with PCP whether or not they recommend statin due to diabetes mellitus.  Counseling Intensive lifestyle modifications are the first line treatment for this issue. Dietary changes: Increase soluble fiber. Decrease simple carbohydrates. Exercise changes: Moderate to vigorous-intensity aerobic activity 150 minutes per week if tolerated. Lipid-lowering medications: see documented in medical record.  3. Other specified hypothyroidism Patient with long-standing hypothyroidism, on levothyroxine therapy. She appears euthyroid. Orders and follow up as documented in patient record.  Counseling Good thyroid control is important for overall health. Supratherapeutic thyroid levels are dangerous and will not improve weight loss results. The correct way to take levothyroxine is fasting, with water, separated by at least 30 minutes from breakfast, and separated by more than 4 hours from calcium, iron, multivitamins, acid reflux medications (PPIs).   4. GAD (generalized anxiety disorder), with emotional eating Make follow up with Marie Wilson for emotional eating strategies/counseling. If pt feels increased stress and emotions are not well controlled in the future, I recommend CBT and separate counselor for mood management. Prudent nutritional plan and weight loss will help as well.  5. NAFLD (nonalcoholic fatty liver disease) We discussed the diagnosis of non-alcoholic fatty liver disease today and how this condition is  obesity related. Marie Wilson was educated the importance of weight loss. Marie Wilson agreed to continue with her weight loss efforts with healthier diet/prudent nutritional plan and exercise as an essential part of her treatment plan.  6. Vitamin D deficiency Plan: - Discussed importance of vitamin D to their health and well-being.  - possible symptoms of low Vitamin D can be low energy, depressed mood, muscle aches, joint aches, osteoporosis etc. - low Vitamin D levels may be linked to an increased risk of cardiovascular events and even increased risk of cancers- such as colon and breast.  - I recommend pt take a 50,000 IU weekly prescription vit D - see script below   - Informed patient this may be a lifelong thing, and she was encouraged to continue to take the medicine until told otherwise.   - we will need to monitor levels regularly (every 3-4 mo on average) to keep levels within normal limits.  - weight loss will likely improve availability of vitamin D, thus encouraged Marie Wilson to continue with meal plan and their weight loss efforts to further improve this condition - pt's questions and concerns regarding this condition addressed.  Start- Vitamin D, Ergocalciferol, (DRISDOL) 1.25 MG (50000 UNIT) CAPS capsule; Take 1 capsule (50,000 Units total) by mouth every 7 (seven) days.  Dispense: 4 capsule; Refill: 0  7. At risk for heart disease Due to Marie Wilson's current state of health and medical condition(s), she is at a higher risk for heart disease.  This puts the patient at much greater risk to subsequently develop cardiopulmonary conditions that can significantly affect patient's quality of life in a negative manner.    At least 23 minutes were spent on counseling Marie Wilson about these concerns today, and I stressed the importance of reversing risks factors of obesity, especially truncal and visceral fat, hypertension, hyperlipidemia, and pre-diabetes.  The initial goal is to lose at least 5-10% of starting  weight to help reduce these risk factors.  Counseling:  Intensive lifestyle modifications were discussed with Marie Wilson as the most appropriate first line of treatment.  she will continue to work on diet, exercise, and weight loss efforts.  We will continue to reassess these conditions on a fairly regular basis in an attempt to decrease the patient's overall morbidity and mortality.  Evidence-based interventions for health behavior change were utilized today including the discussion of self monitoring techniques, problem-solving barriers, and SMART goal setting techniques.  Specifically, regarding patient's less desirable eating habits and patterns, we employed the technique of small changes when Marie Wilson has not been able to fully commit to her prudent nutritional plan.  8. Obesity with current BMI of 55.7  Marie Wilson is currently in the action stage of change. As such, her goal is to continue with weight loss efforts. She has agreed to the Category 3 Plan.   Exercise goals:  As is  Behavioral modification strategies: increasing lean protein intake, decreasing simple carbohydrates, meal planning and cooking strategies, keeping healthy foods in the home, and planning for success.  Marie Wilson has agreed to follow-up with our clinic in 2 weeks. She was informed of the importance of frequent follow-up visits to maximize her success with intensive lifestyle modifications for her multiple health conditions.   Objective:   Blood pressure 133/84, pulse 69, temperature (!) 97.4 F (36.3 C), height 5\' 6"  (1.676 m), weight (!) 345 lb (156.5 kg), SpO2 97 %. Body mass index is 55.68 kg/m.  General: Cooperative, alert, well developed, in no acute distress. HEENT: Conjunctivae and lids unremarkable. Cardiovascular: Regular rhythm.  Lungs: Normal work of breathing. Neurologic: No focal deficits.   Lab Results  Component Value Date   CREATININE 0.89 11/10/2020   BUN 14 11/10/2020   NA 140 11/10/2020   K 3.9  11/10/2020   CL 98 11/10/2020   CO2 25 11/10/2020   Lab Results  Component Value Date   ALT 47 (H) 11/10/2020   AST 64 (H) 11/10/2020   ALKPHOS 91 11/10/2020   BILITOT 0.5 11/10/2020   Lab Results  Component Value Date   HGBA1C 6.3 (H) 11/10/2020   HGBA1C 6.0 (H) 02/22/2019   HGBA1C 5.8 (H) 12/08/2015   Lab Results  Component Value Date   INSULIN 18.2 11/10/2020   Lab Results  Component Value Date   TSH 3.110 11/10/2020   Lab Results  Component Value Date   CHOL 158 11/10/2020   HDL 48 11/10/2020   LDLCALC 79 11/10/2020   TRIG 182 (H) 11/10/2020   Lab Results  Component Value Date   VD25OH 26.9 (L) 11/10/2020   Lab Results  Component Value Date   WBC 11.6 (H) 11/10/2020   HGB 15.4 11/10/2020   HCT 47.0 (H) 11/10/2020   MCV 89 11/10/2020   PLT 349 11/10/2020   Lab Results  Component Value Date  FERRITIN 508 (H) 02/21/2019    Attestation Statements:   Reviewed by clinician on day of visit: allergies, medications, problem list, medical history, surgical history, family history, social history, and previous encounter notes.  Coral Ceo, CMA, am acting as transcriptionist for [employer], DO.  I have reviewed the above documentation for accuracy and completeness, and I agree with the above. Marie Wilson, D.O.  The [place] was signed into law in 2016 which includes the topic of electronic health records.  This provides immediate access to information in MyChart.  This includes consultation notes, operative notes, office notes, lab results and pathology reports.  If you have any questions about what you read please let us know at your next visit so we can discuss your concerns and take corrective action if need be.  We are right here with you.

## 2020-12-07 ENCOUNTER — Other Ambulatory Visit (HOSPITAL_COMMUNITY): Payer: Self-pay

## 2020-12-12 ENCOUNTER — Ambulatory Visit (INDEPENDENT_AMBULATORY_CARE_PROVIDER_SITE_OTHER): Payer: 59 | Admitting: Bariatrics

## 2020-12-15 ENCOUNTER — Other Ambulatory Visit (HOSPITAL_COMMUNITY): Payer: Self-pay

## 2020-12-15 ENCOUNTER — Other Ambulatory Visit (HOSPITAL_BASED_OUTPATIENT_CLINIC_OR_DEPARTMENT_OTHER): Payer: Self-pay

## 2020-12-15 MED ORDER — FLUARIX QUADRIVALENT 0.5 ML IM SUSY
PREFILLED_SYRINGE | INTRAMUSCULAR | 0 refills | Status: DC
  Filled 2020-12-15: qty 0.5, 1d supply, fill #0

## 2020-12-26 ENCOUNTER — Other Ambulatory Visit: Payer: Self-pay

## 2020-12-26 ENCOUNTER — Other Ambulatory Visit (HOSPITAL_COMMUNITY): Payer: Self-pay

## 2020-12-26 ENCOUNTER — Ambulatory Visit (INDEPENDENT_AMBULATORY_CARE_PROVIDER_SITE_OTHER): Payer: 59 | Admitting: Bariatrics

## 2020-12-26 ENCOUNTER — Encounter (INDEPENDENT_AMBULATORY_CARE_PROVIDER_SITE_OTHER): Payer: Self-pay | Admitting: Bariatrics

## 2020-12-26 VITALS — BP 110/82 | HR 59 | Temp 97.5°F | Ht 66.0 in | Wt 336.0 lb

## 2020-12-26 DIAGNOSIS — Z6841 Body Mass Index (BMI) 40.0 and over, adult: Secondary | ICD-10-CM

## 2020-12-26 DIAGNOSIS — K760 Fatty (change of) liver, not elsewhere classified: Secondary | ICD-10-CM

## 2020-12-26 DIAGNOSIS — R632 Polyphagia: Secondary | ICD-10-CM

## 2020-12-26 DIAGNOSIS — E559 Vitamin D deficiency, unspecified: Secondary | ICD-10-CM

## 2020-12-26 DIAGNOSIS — F509 Eating disorder, unspecified: Secondary | ICD-10-CM | POA: Insufficient documentation

## 2020-12-26 MED ORDER — TIRZEPATIDE 2.5 MG/0.5ML ~~LOC~~ SOAJ
2.5000 mg | SUBCUTANEOUS | 0 refills | Status: DC
  Filled 2020-12-26: qty 2, 28d supply, fill #0

## 2020-12-26 MED ORDER — VITAMIN D (ERGOCALCIFEROL) 1.25 MG (50000 UNIT) PO CAPS
50000.0000 [IU] | ORAL_CAPSULE | ORAL | 0 refills | Status: DC
  Filled 2020-12-26: qty 4, 28d supply, fill #0

## 2020-12-26 NOTE — Progress Notes (Signed)
Chief Complaint:   OBESITY Marie Wilson is here to discuss her progress with her obesity treatment plan along with follow-up of her obesity related diagnoses. Marie Wilson is on the Category 3 Plan and states she is following her eating plan approximately 20% of the time. Marie Wilson states she is walking for 15 minutes 3 times per week.  Today's visit was #: 3 Starting weight: 346 lbs Starting date: 11/10/2020 Today's weight: 336 lbs Today's date: 12/26/2020 Total lbs lost to date: 10 lbs Total lbs lost since last in-office visit: 9 lbs  Interim History: Marie Wilson is down another 9 lbs since her last visit. She cut out sodas. She is getting more water.   Subjective:   1. Vitamin D deficiency Marie Wilson is taking Vitamin D currently.   2. NAFLD (nonalcoholic fatty liver disease) Marie Wilson is working losing weight and increasing activities.  Marie Wilson denies a history of pancreatitis, and thyroid cancer.( modularly  ) per self or family and no MEN symdrome, or triglycerides over 500.   Assessment/Plan:   1. Vitamin D deficiency Low Vitamin D level contributes to fatigue and are associated with obesity, breast, and colon cancer.We will refill prescription Vitamin D 50,000 IU every week for 1 month with no refills and Khila will follow-up for routine testing of Vitamin D, at least 2-3 times per year to avoid over-replacement.  - Vitamin D, Ergocalciferol, (DRISDOL) 1.25 MG (50000 UNIT) CAPS capsule; Take 1 capsule (50,000 Units total) by mouth every 7 (seven) days Dispense: 4 capsule; Refill: 0  2. NAFLD (nonalcoholic fatty liver disease) Marie Wilson will increase activities and she will continue with weight loss. We discussed the likely diagnosis of non-alcoholic fatty liver disease today and how this condition is obesity related. Marie Wilson was educated the importance of weight loss. Marie Wilson agreed to continue with her weight loss efforts with healthier diet and exercise as an essential part of her  treatment plan.   3. Polyphagia Intensive lifestyle modifications are the first line treatment for this issue. We discussed several lifestyle modifications today and she will continue to work on diet, exercise and weight loss efforts. Marie Wilson will increase protein. She will increase her water intake. We will refill Mounjaro 2.5 mg weekly with no refills. Orders and follow up as documented in patient record.  Counseling Polyphagia is excessive hunger. Causes can include: low blood sugars, hypERthyroidism, PMS, lack of sleep, stress, insulin resistance, diabetes, certain medications, and diets that are deficient in protein and fiber.    - tirzepatide (MOUNJARO) 2.5 MG/0.5ML Pen; Inject 2.5 mg into the skin once a week.  Dispense: 2 mL; Refill: 0  4. Class 3 severe obesity with serious comorbidity and body mass index (BMI) of 50.0 to 59.9 in adult, unspecified obesity type [facility]) Marie Wilson is currently in the action stage of change. As such, her goal is to continue with weight loss efforts. She has agreed to the Category 3 Plan.   Marie Wilson will continue meal planning and intentional eating. She will continue to decrease sodas.   Exercise goals:  As is.  Behavioral modification strategies: increasing lean protein intake, decreasing simple carbohydrates, increasing vegetables, increasing water intake, decreasing eating out, no skipping meals, meal planning and cooking strategies, keeping healthy foods in the home, and planning for success.  Marie Wilson has agreed to follow-up with our clinic in 2 weeks with myself or Dr. Raliegh Scarlet. She was informed of the importance of frequent follow-up visits to maximize her success with intensive lifestyle modifications for her multiple  health conditions.   Objective:   Blood pressure 110/82, pulse (!) 59, temperature (!) 97.5 F (36.4 C), height 5\' 6"  (1.676 m), weight (!) 336 lb (152.4 kg), SpO2 96 %. Body mass index is 54.23 kg/m.  General: Cooperative, alert, well  developed, in no acute distress. HEENT: Conjunctivae and lids unremarkable. Cardiovascular: Regular rhythm.  Lungs: Normal work of breathing. Neurologic: No focal deficits.   Lab Results  Component Value Date   CREATININE 0.89 11/10/2020   BUN 14 11/10/2020   NA 140 11/10/2020   K 3.9 11/10/2020   CL 98 11/10/2020   CO2 25 11/10/2020   Lab Results  Component Value Date   ALT 47 (H) 11/10/2020   AST 64 (H) 11/10/2020   ALKPHOS 91 11/10/2020   BILITOT 0.5 11/10/2020   Lab Results  Component Value Date   HGBA1C 6.3 (H) 11/10/2020   HGBA1C 6.0 (H) 02/22/2019   HGBA1C 5.8 (H) 12/08/2015   Lab Results  Component Value Date   INSULIN 18.2 11/10/2020   Lab Results  Component Value Date   TSH 3.110 11/10/2020   Lab Results  Component Value Date   CHOL 158 11/10/2020   HDL 48 11/10/2020   LDLCALC 79 11/10/2020   TRIG 182 (H) 11/10/2020   Lab Results  Component Value Date   VD25OH 26.9 (L) 11/10/2020   Lab Results  Component Value Date   WBC 11.6 (H) 11/10/2020   HGB 15.4 11/10/2020   HCT 47.0 (H) 11/10/2020   MCV 89 11/10/2020   PLT 349 11/10/2020   Lab Results  Component Value Date   FERRITIN 508 (H) 02/21/2019   Attestation Statements:   Reviewed by clinician on day of visit: allergies, medications, problem list, medical history, surgical history, family history, social history, and previous encounter notes.  I, Lizbeth Bark, RMA, am acting as Location manager for [employer], DO.   I have reviewed the above documentation for accuracy and completeness, and I agree with the above. Jearld Lesch, DO

## 2020-12-27 ENCOUNTER — Encounter (INDEPENDENT_AMBULATORY_CARE_PROVIDER_SITE_OTHER): Payer: Self-pay | Admitting: Bariatrics

## 2021-01-10 ENCOUNTER — Ambulatory Visit (INDEPENDENT_AMBULATORY_CARE_PROVIDER_SITE_OTHER): Payer: 59 | Admitting: Bariatrics

## 2021-01-30 ENCOUNTER — Other Ambulatory Visit (INDEPENDENT_AMBULATORY_CARE_PROVIDER_SITE_OTHER): Payer: Self-pay | Admitting: Bariatrics

## 2021-01-30 ENCOUNTER — Other Ambulatory Visit (HOSPITAL_COMMUNITY): Payer: Self-pay

## 2021-01-30 DIAGNOSIS — R632 Polyphagia: Secondary | ICD-10-CM

## 2021-01-30 DIAGNOSIS — E559 Vitamin D deficiency, unspecified: Secondary | ICD-10-CM

## 2021-01-30 MED FILL — Tirzepatide Soln Auto-injector 2.5 MG/0.5ML: SUBCUTANEOUS | 28 days supply | Qty: 2 | Fill #0 | Status: AC

## 2021-01-30 NOTE — Telephone Encounter (Signed)
LAST APPOINTMENT DATE: 12/26/20 NEXT APPOINTMENT DATE: 02/22/21   [place] [place] [address] Phone: [phone] Fax: [phone]  Patient is requesting a refill of the following medications: Requested Prescriptions   Pending Prescriptions Disp Refills   MOUNJARO 2.5 MG/0.5ML Pen [Pharmacy Med Name: tirzepatide [facility]) 2.5 MG/0.5ML Pen] 2 mL 0    Sig: Inject 2.5 mg into the skin once a week.    Date last filled: 12/26/20 Previously prescribed by Dr. Owens Shark  Lab Results  Component Value Date   HGBA1C 6.3 (H) 11/10/2020   HGBA1C 6.0 (H) 02/22/2019   HGBA1C 5.8 (H) 12/08/2015   Lab Results  Component Value Date   LDLCALC 79 11/10/2020   CREATININE 0.89 11/10/2020   Lab Results  Component Value Date   VD25OH 26.9 (L) 11/10/2020    BP Readings from Last 3 Encounters:  12/26/20 110/82  11/23/20 133/84  11/10/20 107/71

## 2021-01-30 NOTE — Telephone Encounter (Signed)
LAST APPOINTMENT DATE: 12/26/20 NEXT APPOINTMENT DATE: 02/22/2021   [address] [place] [address] Phone: [phone] Fax: [phone]  Patient is requesting a refill of the following medications: Requested Prescriptions   Pending Prescriptions Disp Refills   Vitamin D, Ergocalciferol, (DRISDOL) 1.25 MG (50000 UNIT) CAPS capsule 4 capsule 0    Sig: Take 1 capsule (50,000 Units total) by mouth every 7 (seven) days.  Need office visit for more reflills   tirzepatide (MOUNJARO) 2.5 MG/0.5ML Pen 2 mL 0    Sig: Inject 2.5 mg into the skin once a week.    Date last filled: 12/26/20 Previously prescribed by DR Owens Shark  Lab Results  Component Value Date   HGBA1C 6.3 (H) 11/10/2020   HGBA1C 6.0 (H) 02/22/2019   HGBA1C 5.8 (H) 12/08/2015   Lab Results  Component Value Date   LDLCALC 79 11/10/2020   CREATININE 0.89 11/10/2020   Lab Results  Component Value Date   VD25OH 26.9 (L) 11/10/2020    BP Readings from Last 3 Encounters:  12/26/20 110/82  11/23/20 133/84  11/10/20 107/71

## 2021-02-16 DIAGNOSIS — H52223 Regular astigmatism, bilateral: Secondary | ICD-10-CM | POA: Diagnosis not present

## 2021-02-16 DIAGNOSIS — H5213 Myopia, bilateral: Secondary | ICD-10-CM | POA: Diagnosis not present

## 2021-02-16 DIAGNOSIS — H524 Presbyopia: Secondary | ICD-10-CM | POA: Diagnosis not present

## 2021-02-22 ENCOUNTER — Ambulatory Visit (INDEPENDENT_AMBULATORY_CARE_PROVIDER_SITE_OTHER): Payer: No Typology Code available for payment source | Admitting: Bariatrics

## 2021-02-22 ENCOUNTER — Other Ambulatory Visit (HOSPITAL_COMMUNITY): Payer: Self-pay

## 2021-02-22 ENCOUNTER — Encounter (INDEPENDENT_AMBULATORY_CARE_PROVIDER_SITE_OTHER): Payer: Self-pay | Admitting: Bariatrics

## 2021-02-22 ENCOUNTER — Other Ambulatory Visit: Payer: Self-pay

## 2021-02-22 VITALS — BP 116/80 | HR 71 | Temp 97.6°F | Ht 66.0 in | Wt 325.0 lb

## 2021-02-22 DIAGNOSIS — R632 Polyphagia: Secondary | ICD-10-CM | POA: Diagnosis not present

## 2021-02-22 DIAGNOSIS — Z6841 Body Mass Index (BMI) 40.0 and over, adult: Secondary | ICD-10-CM

## 2021-02-22 DIAGNOSIS — K760 Fatty (change of) liver, not elsewhere classified: Secondary | ICD-10-CM

## 2021-02-22 DIAGNOSIS — E1169 Type 2 diabetes mellitus with other specified complication: Secondary | ICD-10-CM | POA: Diagnosis not present

## 2021-02-22 MED ORDER — TIRZEPATIDE 5 MG/0.5ML ~~LOC~~ SOAJ
5.0000 mg | SUBCUTANEOUS | 0 refills | Status: DC
  Filled 2021-02-22: qty 2, 28d supply, fill #0

## 2021-02-22 NOTE — Progress Notes (Signed)
Chief Complaint:   OBESITY Shahad is here to discuss her progress with her obesity treatment plan along with follow-up of her obesity related diagnoses. Mersadez is on the Category 3 Plan and states she is following her eating plan approximately 15% of the time. Adasia states she is doing 0 minutes 0 times per week.  Today's visit was #: 4 Starting weight: 346 lbs Starting date: 11/10/2020 Today's weight: 325 lbs Today's date: 02/22/2021 Total lbs lost to date: 21 lbs Total lbs lost since last in-office visit: 11 lbs  Interim History: Crucita is down another 11 lbs and doing well overall. She ate more carbohydrates over the holidays and regimented eating.   Subjective:   1. Polyphagia Ginnifer denies side effects. She is currently taking Mounjaro.   2. NAFLD (nonalcoholic fatty liver disease) Naika is working on weight loss.   3. Type 2 diabetes mellitus with other specified complication, without long-term current use of insulin ([name]) Krishauna is taking Mounjaro currently.   Assessment/Plan:   1. Polyphagia We will refill Mounjaro 5 mg with no refills Intensive lifestyle modifications are the first line treatment for this issue. We discussed several lifestyle modifications today and she will continue to work on diet, exercise and weight loss efforts. Orders and follow up as documented in patient record.  Counseling Polyphagia is excessive hunger. Causes can include: low blood sugars, hypERthyroidism, PMS, lack of sleep, stress, insulin resistance, diabetes, certain medications, and diets that are deficient in protein and fiber.   - tirzepatide (MOUNJARO) 5 MG/0.5ML Pen; Inject 5 mg into the skin once a week.  Dispense: 2 mL; Refill: 0  2. NAFLD (nonalcoholic fatty liver disease) We discussed the likely diagnosis of non-alcoholic fatty liver disease today and how this condition is obesity related. Iveth was educated the importance of weight loss. Nyima agreed to continue with her  weight loss efforts with healthier diet and exercise as an essential part of her treatment plan. She will add activities.   3. Type 2 diabetes mellitus with other specified complication, without long-term current use of insulin ([name]) Ameilia will continue taking her medications. She will keep carbohydrates low. We will refill Mounjaro 5 mg with no refills. Good blood sugar control is important to decrease the likelihood of diabetic complications such as nephropathy, neuropathy, limb loss, blindness, coronary artery disease, and death. Intensive lifestyle modification including diet, exercise and weight loss are the first line of treatment for diabetes.   - tirzepatide [facility]) 5 MG/0.5ML Pen; Inject 5 mg into the skin once a week.  Dispense: 2 mL; Refill: 0  4. Obesity, current BMI 52.5 Juliauna is currently in the action stage of change. As such, her goal is to continue with weight loss efforts. She has agreed to the Category 3 Plan.   Mahasin will continue meal planning and she will continue intentional eating. Her goal is to get below 300 lbs.   Exercise goals: No exercise has been prescribed at this time.  Behavioral modification strategies: increasing lean protein intake, decreasing simple carbohydrates, increasing vegetables, increasing water intake, decreasing eating out, no skipping meals, meal planning and cooking strategies, keeping healthy foods in the home, and planning for success.  Rettie has agreed to follow-up with our clinic in 2 weeks with myself or Mina Marble, NP or Dr. Raliegh Scarlet (fasting). She was informed of the importance of frequent follow-up visits to maximize her success with intensive lifestyle modifications for her multiple health conditions.   Objective:   Blood pressure  116/80, pulse 71, temperature 97.6 F (36.4 C), height 5\' 6"  (1.676 m), weight (!) 325 lb (147.4 kg), SpO2 96 %. Body mass index is 52.46 kg/m.  General: Cooperative, alert, well developed, in no  acute distress. HEENT: Conjunctivae and lids unremarkable. Cardiovascular: Regular rhythm.  Lungs: Normal work of breathing. Neurologic: No focal deficits.   Lab Results  Component Value Date   CREATININE 0.89 11/10/2020   BUN 14 11/10/2020   NA 140 11/10/2020   K 3.9 11/10/2020   CL 98 11/10/2020   CO2 25 11/10/2020   Lab Results  Component Value Date   ALT 47 (H) 11/10/2020   AST 64 (H) 11/10/2020   ALKPHOS 91 11/10/2020   BILITOT 0.5 11/10/2020   Lab Results  Component Value Date   HGBA1C 6.3 (H) 11/10/2020   HGBA1C 6.0 (H) 02/22/2019   HGBA1C 5.8 (H) 12/08/2015   Lab Results  Component Value Date   INSULIN 18.2 11/10/2020   Lab Results  Component Value Date   TSH 3.110 11/10/2020   Lab Results  Component Value Date   CHOL 158 11/10/2020   HDL 48 11/10/2020   LDLCALC 79 11/10/2020   TRIG 182 (H) 11/10/2020   Lab Results  Component Value Date   VD25OH 26.9 (L) 11/10/2020   Lab Results  Component Value Date   WBC 11.6 (H) 11/10/2020   HGB 15.4 11/10/2020   HCT 47.0 (H) 11/10/2020   MCV 89 11/10/2020   PLT 349 11/10/2020   Lab Results  Component Value Date   FERRITIN 508 (H) 02/21/2019   Attestation Statements:   Reviewed by clinician on day of visit: allergies, medications, problem list, medical history, surgical history, family history, social history, and previous encounter notes.  I, Lizbeth Bark, RMA, am acting as Location manager for [employer], DO.  I have reviewed the above documentation for accuracy and completeness, and I agree with the above. Jearld Lesch, DO

## 2021-02-23 ENCOUNTER — Encounter (INDEPENDENT_AMBULATORY_CARE_PROVIDER_SITE_OTHER): Payer: Self-pay | Admitting: Bariatrics

## 2021-03-01 ENCOUNTER — Ambulatory Visit
Admission: RE | Admit: 2021-03-01 | Discharge: 2021-03-01 | Disposition: A | Discharge status: Home / Self Care | Payer: No Typology Code available for payment source | Source: Ambulatory Visit | Attending: Hematology and Oncology | Admitting: Hematology and Oncology

## 2021-03-01 ENCOUNTER — Other Ambulatory Visit: Payer: Self-pay | Admitting: Hematology and Oncology

## 2021-03-01 DIAGNOSIS — R921 Mammographic calcification found on diagnostic imaging of breast: Secondary | ICD-10-CM

## 2021-03-01 DIAGNOSIS — Z853 Personal history of malignant neoplasm of breast: Secondary | ICD-10-CM

## 2021-03-08 ENCOUNTER — Encounter (INDEPENDENT_AMBULATORY_CARE_PROVIDER_SITE_OTHER): Payer: Self-pay

## 2021-03-08 ENCOUNTER — Ambulatory Visit (INDEPENDENT_AMBULATORY_CARE_PROVIDER_SITE_OTHER): Payer: No Typology Code available for payment source | Admitting: Bariatrics

## 2021-03-13 ENCOUNTER — Other Ambulatory Visit (HOSPITAL_COMMUNITY): Payer: Self-pay

## 2021-03-15 ENCOUNTER — Other Ambulatory Visit (HOSPITAL_COMMUNITY): Payer: Self-pay

## 2021-03-15 MED ORDER — VENLAFAXINE HCL ER 150 MG PO CP24
ORAL_CAPSULE | ORAL | 0 refills | Status: DC
  Filled 2021-03-15: qty 90, 90d supply, fill #0

## 2021-03-15 MED ORDER — PROPRANOLOL HCL ER 60 MG PO CP24
60.0000 mg | ORAL_CAPSULE | Freq: Every day | ORAL | 0 refills | Status: DC
  Filled 2021-03-15: qty 90, 90d supply, fill #0

## 2021-03-15 MED ORDER — LEVOTHYROXINE SODIUM 112 MCG PO TABS
ORAL_TABLET | ORAL | 0 refills | Status: DC
  Filled 2021-03-15: qty 90, 90d supply, fill #0

## 2021-03-16 ENCOUNTER — Other Ambulatory Visit (HOSPITAL_COMMUNITY): Payer: Self-pay

## 2021-03-16 MED ORDER — MOUNJARO 5 MG/0.5ML ~~LOC~~ SOAJ
SUBCUTANEOUS | 3 refills | Status: AC
  Filled 2021-03-16: qty 2, 28d supply, fill #0
  Filled 2021-04-20: qty 2, 28d supply, fill #1
  Filled 2021-05-16: qty 2, 28d supply, fill #2
  Filled 2021-06-12 – 2021-09-13 (×2): qty 2, 28d supply, fill #3

## 2021-04-20 ENCOUNTER — Other Ambulatory Visit (HOSPITAL_COMMUNITY): Payer: Self-pay

## 2021-05-16 ENCOUNTER — Other Ambulatory Visit (HOSPITAL_COMMUNITY): Payer: Self-pay

## 2021-05-31 LAB — EXTERNAL GENERIC LAB PROCEDURE

## 2021-06-10 ENCOUNTER — Encounter (HOSPITAL_BASED_OUTPATIENT_CLINIC_OR_DEPARTMENT_OTHER): Payer: Self-pay

## 2021-06-10 DIAGNOSIS — R0683 Snoring: Secondary | ICD-10-CM

## 2021-06-10 DIAGNOSIS — R5383 Other fatigue: Secondary | ICD-10-CM

## 2021-06-10 DIAGNOSIS — G4710 Hypersomnia, unspecified: Secondary | ICD-10-CM

## 2021-06-12 ENCOUNTER — Other Ambulatory Visit (HOSPITAL_COMMUNITY): Payer: Self-pay

## 2021-06-13 ENCOUNTER — Observation Stay (HOSPITAL_BASED_OUTPATIENT_CLINIC_OR_DEPARTMENT_OTHER)
Admission: EM | Admit: 2021-06-13 | Discharge: 2021-06-14 | Disposition: A | Discharge status: Home / Self Care | Payer: No Typology Code available for payment source | Attending: Surgery | Admitting: Surgery

## 2021-06-13 ENCOUNTER — Emergency Department (HOSPITAL_BASED_OUTPATIENT_CLINIC_OR_DEPARTMENT_OTHER): Payer: No Typology Code available for payment source | Admitting: Certified Registered Nurse Anesthetist

## 2021-06-13 ENCOUNTER — Encounter (HOSPITAL_BASED_OUTPATIENT_CLINIC_OR_DEPARTMENT_OTHER): Payer: Self-pay

## 2021-06-13 ENCOUNTER — Emergency Department (HOSPITAL_COMMUNITY): Payer: No Typology Code available for payment source | Admitting: Certified Registered Nurse Anesthetist

## 2021-06-13 ENCOUNTER — Encounter (HOSPITAL_COMMUNITY)
Admission: EM | Disposition: A | Discharge status: Home / Self Care | Payer: Self-pay | Source: Home / Self Care | Attending: Emergency Medicine

## 2021-06-13 ENCOUNTER — Ambulatory Visit: Admit: 2021-06-13 | Payer: No Typology Code available for payment source | Admitting: Surgery

## 2021-06-13 ENCOUNTER — Other Ambulatory Visit: Payer: Self-pay

## 2021-06-13 ENCOUNTER — Emergency Department (HOSPITAL_BASED_OUTPATIENT_CLINIC_OR_DEPARTMENT_OTHER): Payer: No Typology Code available for payment source

## 2021-06-13 DIAGNOSIS — Z8616 Personal history of COVID-19: Secondary | ICD-10-CM | POA: Diagnosis not present

## 2021-06-13 DIAGNOSIS — I10 Essential (primary) hypertension: Secondary | ICD-10-CM

## 2021-06-13 DIAGNOSIS — Z7985 Long-term (current) use of injectable non-insulin antidiabetic drugs: Secondary | ICD-10-CM | POA: Diagnosis not present

## 2021-06-13 DIAGNOSIS — K3580 Unspecified acute appendicitis: Secondary | ICD-10-CM | POA: Diagnosis present

## 2021-06-13 DIAGNOSIS — E039 Hypothyroidism, unspecified: Secondary | ICD-10-CM

## 2021-06-13 DIAGNOSIS — K37 Unspecified appendicitis: Secondary | ICD-10-CM | POA: Diagnosis not present

## 2021-06-13 DIAGNOSIS — Z79899 Other long term (current) drug therapy: Secondary | ICD-10-CM | POA: Diagnosis not present

## 2021-06-13 DIAGNOSIS — E119 Type 2 diabetes mellitus without complications: Secondary | ICD-10-CM | POA: Insufficient documentation

## 2021-06-13 DIAGNOSIS — D638 Anemia in other chronic diseases classified elsewhere: Secondary | ICD-10-CM | POA: Diagnosis not present

## 2021-06-13 DIAGNOSIS — K353 Acute appendicitis with localized peritonitis, without perforation or gangrene: Principal | ICD-10-CM

## 2021-06-13 DIAGNOSIS — Z853 Personal history of malignant neoplasm of breast: Secondary | ICD-10-CM | POA: Diagnosis not present

## 2021-06-13 DIAGNOSIS — R1031 Right lower quadrant pain: Secondary | ICD-10-CM | POA: Diagnosis present

## 2021-06-13 HISTORY — PX: LAPAROSCOPIC APPENDECTOMY: SHX408

## 2021-06-13 LAB — URINALYSIS, ROUTINE W REFLEX MICROSCOPIC
Bilirubin Urine: NEGATIVE
Glucose, UA: NEGATIVE mg/dL
Hgb urine dipstick: NEGATIVE
Ketones, ur: NEGATIVE mg/dL
Nitrite: NEGATIVE
Protein, ur: 30 mg/dL — AB
Specific Gravity, Urine: 1.02 (ref 1.005–1.030)
pH: 5.5 (ref 5.0–8.0)

## 2021-06-13 LAB — CREATININE, SERUM
Creatinine, Ser: 1.04 mg/dL — ABNORMAL HIGH (ref 0.44–1.00)
GFR, Estimated: >60 mL/min (ref >60)

## 2021-06-13 LAB — CBG MONITORING, ED: Glucose-Capillary: 104 mg/dL — ABNORMAL HIGH (ref 70–99)

## 2021-06-13 LAB — CBC WITH DIFFERENTIAL/PLATELET
Abs Immature Granulocytes: 0.09 10*3/uL — ABNORMAL HIGH (ref 0.00–0.07)
Basophils Absolute: 0.1 10*3/uL (ref 0.0–0.1)
Basophils Relative: 1 %
Eosinophils Absolute: 0.1 10*3/uL (ref 0.0–0.5)
Eosinophils Relative: 0 %
HCT: 47.1 % — ABNORMAL HIGH (ref 36.0–46.0)
Hemoglobin: 15.5 g/dL — ABNORMAL HIGH (ref 12.0–15.0)
Immature Granulocytes: 1 %
Lymphocytes Relative: 15 %
Lymphs Abs: 3.1 10*3/uL (ref 0.7–4.0)
MCH: 28.4 pg (ref 26.0–34.0)
MCHC: 32.9 g/dL (ref 30.0–36.0)
MCV: 86.3 fL (ref 80.0–100.0)
Monocytes Absolute: 1.2 10*3/uL — ABNORMAL HIGH (ref 0.1–1.0)
Monocytes Relative: 6 %
Neutro Abs: 15.3 10*3/uL — ABNORMAL HIGH (ref 1.7–7.7)
Neutrophils Relative %: 77 %
Platelets: 289 10*3/uL (ref 150–400)
RBC: 5.46 MIL/uL — ABNORMAL HIGH (ref 3.87–5.11)
RDW: 13.2 % (ref 11.5–15.5)
WBC: 19.8 10*3/uL — ABNORMAL HIGH (ref 4.0–10.5)
nRBC: 0 % (ref 0.0–0.2)

## 2021-06-13 LAB — CBC
HCT: 43.7 % (ref 36.0–46.0)
Hemoglobin: 14.5 g/dL (ref 12.0–15.0)
MCH: 29.4 pg (ref 26.0–34.0)
MCHC: 33.2 g/dL (ref 30.0–36.0)
MCV: 88.5 fL (ref 80.0–100.0)
Platelets: 274 10*3/uL (ref 150–400)
RBC: 4.94 MIL/uL (ref 3.87–5.11)
RDW: 13 % (ref 11.5–15.5)
WBC: 13.6 10*3/uL — ABNORMAL HIGH (ref 4.0–10.5)
nRBC: 0 % (ref 0.0–0.2)

## 2021-06-13 LAB — COMPREHENSIVE METABOLIC PANEL
ALT: 13 U/L (ref 0–44)
AST: 17 U/L (ref 15–41)
Albumin: 3.7 g/dL (ref 3.5–5.0)
Alkaline Phosphatase: 56 U/L (ref 38–126)
Anion gap: 11 (ref 5–15)
BUN: 9 mg/dL (ref 6–20)
CO2: 23 mmol/L (ref 22–32)
Calcium: 9.5 mg/dL (ref 8.9–10.3)
Chloride: 103 mmol/L (ref 98–111)
Creatinine, Ser: 1.02 mg/dL — ABNORMAL HIGH (ref 0.44–1.00)
GFR, Estimated: >60 mL/min (ref >60)
Glucose, Bld: 100 mg/dL — ABNORMAL HIGH (ref 70–99)
Potassium: 3.6 mmol/L (ref 3.5–5.1)
Sodium: 137 mmol/L (ref 135–145)
Total Bilirubin: 1.1 mg/dL (ref 0.3–1.2)
Total Protein: 6.9 g/dL (ref 6.5–8.1)

## 2021-06-13 LAB — GLUCOSE, CAPILLARY
Glucose-Capillary: 100 mg/dL — ABNORMAL HIGH (ref 70–99)
Glucose-Capillary: 113 mg/dL — ABNORMAL HIGH (ref 70–99)

## 2021-06-13 LAB — LIPASE, BLOOD: Lipase: <10 U/L — ABNORMAL LOW (ref 11–51)

## 2021-06-13 SURGERY — APPENDECTOMY, LAPAROSCOPIC
Anesthesia: General | Site: Abdomen

## 2021-06-13 MED ORDER — ALBUTEROL SULFATE (2.5 MG/3ML) 0.083% IN NEBU: 3.0000 mL | INHALATION_SOLUTION | Freq: Four times a day (QID) | RESPIRATORY_TRACT | Status: DC | PRN

## 2021-06-13 MED ORDER — ONDANSETRON HCL 4 MG/2ML IJ SOLN: 4.0000 mg | Freq: Four times a day (QID) | INTRAMUSCULAR | Status: DC | PRN

## 2021-06-13 MED ORDER — LIDOCAINE 2% (20 MG/ML) 5 ML SYRINGE
INTRAMUSCULAR | Status: DC | PRN
  Administered 2021-06-13: 60 mg via INTRAVENOUS

## 2021-06-13 MED ORDER — CHLORHEXIDINE GLUCONATE CLOTH 2 % EX PADS
6.0000 | MEDICATED_PAD | Freq: Once | CUTANEOUS | Status: DC
  Filled 2021-06-13: qty 6

## 2021-06-13 MED ORDER — CELECOXIB 200 MG PO CAPS
ORAL_CAPSULE | ORAL | Status: AC
  Administered 2021-06-13: 200 mg via ORAL
  Filled 2021-06-13: qty 1

## 2021-06-13 MED ORDER — DOCUSATE SODIUM 100 MG PO CAPS
100.0000 mg | ORAL_CAPSULE | Freq: Two times a day (BID) | ORAL | Status: DC
  Administered 2021-06-14: 100 mg via ORAL
  Filled 2021-06-13 (×2): qty 1

## 2021-06-13 MED ORDER — PHENYLEPHRINE 80 MCG/ML (10ML) SYRINGE FOR IV PUSH (FOR BLOOD PRESSURE SUPPORT)
PREFILLED_SYRINGE | INTRAVENOUS | Status: DC | PRN
  Administered 2021-06-13: 160 ug via INTRAVENOUS

## 2021-06-13 MED ORDER — HYDROMORPHONE HCL 1 MG/ML IJ SOLN
0.2500 mg | INTRAMUSCULAR | Status: DC | PRN
  Administered 2021-06-13 (×3): 0.5 mg via INTRAVENOUS
  Administered 2021-06-13 (×2): 0.25 mg via INTRAVENOUS

## 2021-06-13 MED ORDER — MIDAZOLAM HCL 2 MG/2ML IJ SOLN
INTRAMUSCULAR | Status: DC | PRN
  Administered 2021-06-13: 2 mg via INTRAVENOUS

## 2021-06-13 MED ORDER — LACTATED RINGERS IV SOLN: INTRAVENOUS | Status: DC

## 2021-06-13 MED ORDER — HYDROMORPHONE HCL 1 MG/ML IJ SOLN
INTRAMUSCULAR | Status: AC
  Filled 2021-06-13: qty 1

## 2021-06-13 MED ORDER — PROPOFOL 10 MG/ML IV BOLUS
INTRAVENOUS | Status: DC | PRN
  Administered 2021-06-13: 200 mg via INTRAVENOUS

## 2021-06-13 MED ORDER — 0.9 % SODIUM CHLORIDE (POUR BTL) OPTIME
TOPICAL | Status: DC | PRN
  Administered 2021-06-13: 1000 mL

## 2021-06-13 MED ORDER — SODIUM CHLORIDE 0.9 % IV SOLN: INTRAVENOUS | Status: DC

## 2021-06-13 MED ORDER — ENOXAPARIN SODIUM 40 MG/0.4ML IJ SOSY: 40.0000 mg | PREFILLED_SYRINGE | Freq: Every day | INTRAMUSCULAR | Status: DC

## 2021-06-13 MED ORDER — SIMETHICONE 80 MG PO CHEW
80.0000 mg | CHEWABLE_TABLET | Freq: Four times a day (QID) | ORAL | Status: DC | PRN
Start: 2021-06-13 — End: 2021-06-14

## 2021-06-13 MED ORDER — ACETAMINOPHEN 500 MG PO TABS
ORAL_TABLET | ORAL | Status: AC
  Administered 2021-06-13: 1000 mg via ORAL
  Filled 2021-06-13: qty 2

## 2021-06-13 MED ORDER — KETOROLAC TROMETHAMINE 15 MG/ML IJ SOLN
15.0000 mg | Freq: Three times a day (TID) | INTRAMUSCULAR | Status: DC
  Administered 2021-06-13 – 2021-06-14 (×2): 15 mg via INTRAVENOUS
  Filled 2021-06-13: qty 1

## 2021-06-13 MED ORDER — MIDAZOLAM HCL 2 MG/2ML IJ SOLN
INTRAMUSCULAR | Status: AC
  Filled 2021-06-13: qty 2

## 2021-06-13 MED ORDER — KETOROLAC TROMETHAMINE 15 MG/ML IJ SOLN
INTRAMUSCULAR | Status: AC
  Filled 2021-06-13: qty 1

## 2021-06-13 MED ORDER — ACETAMINOPHEN 325 MG PO TABS
650.0000 mg | ORAL_TABLET | Freq: Four times a day (QID) | ORAL | Status: DC
  Administered 2021-06-14 (×3): 650 mg via ORAL
  Filled 2021-06-13 (×3): qty 2

## 2021-06-13 MED ORDER — MORPHINE SULFATE (PF) 2 MG/ML IV SOLN
INTRAVENOUS | Status: AC
Start: 2021-06-13 — End: 2021-06-14
  Filled 2021-06-13: qty 1

## 2021-06-13 MED ORDER — POTASSIUM CHLORIDE IN NACL 20-0.9 MEQ/L-% IV SOLN: INTRAVENOUS | Status: DC

## 2021-06-13 MED ORDER — DEXAMETHASONE SODIUM PHOSPHATE 10 MG/ML IJ SOLN
INTRAMUSCULAR | Status: DC | PRN
  Administered 2021-06-13: 5 mg via INTRAVENOUS

## 2021-06-13 MED ORDER — SODIUM CHLORIDE 0.9 % IR SOLN
Status: DC | PRN
  Administered 2021-06-13: 1000 mL

## 2021-06-13 MED ORDER — ONDANSETRON HCL 4 MG/2ML IJ SOLN
INTRAMUSCULAR | Status: DC | PRN
  Administered 2021-06-13: 4 mg via INTRAVENOUS

## 2021-06-13 MED ORDER — FENTANYL CITRATE (PF) 250 MCG/5ML IJ SOLN
INTRAMUSCULAR | Status: AC
  Filled 2021-06-13: qty 5

## 2021-06-13 MED ORDER — OXYCODONE HCL 5 MG PO TABS
5.0000 mg | ORAL_TABLET | ORAL | Status: DC | PRN
  Administered 2021-06-13: 5 mg via ORAL

## 2021-06-13 MED ORDER — SUCCINYLCHOLINE CHLORIDE 200 MG/10ML IV SOSY
PREFILLED_SYRINGE | INTRAVENOUS | Status: DC | PRN
  Administered 2021-06-13: 140 mg via INTRAVENOUS

## 2021-06-13 MED ORDER — BUPIVACAINE-EPINEPHRINE 0.5% -1:200000 IJ SOLN
INTRAMUSCULAR | Status: AC
  Filled 2021-06-13: qty 1

## 2021-06-13 MED ORDER — OXYCODONE HCL 5 MG PO TABS
ORAL_TABLET | ORAL | Status: AC
  Filled 2021-06-13: qty 1

## 2021-06-13 MED ORDER — CHLORHEXIDINE GLUCONATE 0.12 % MT SOLN
15.0000 mL | Freq: Once | OROMUCOSAL | Status: AC
  Administered 2021-06-13: 15 mL via OROMUCOSAL
  Filled 2021-06-13: qty 15

## 2021-06-13 MED ORDER — PROPRANOLOL HCL ER 60 MG PO CP24
60.0000 mg | ORAL_CAPSULE | Freq: Every day | ORAL | Status: DC
  Filled 2021-06-13: qty 1

## 2021-06-13 MED ORDER — ACETAMINOPHEN 500 MG PO TABS: 1000.0000 mg | ORAL_TABLET | Freq: Once | ORAL | Status: AC

## 2021-06-13 MED ORDER — MORPHINE SULFATE (PF) 2 MG/ML IV SOLN
2.0000 mg | INTRAVENOUS | Status: DC | PRN
  Administered 2021-06-13: 2 mg via INTRAVENOUS

## 2021-06-13 MED ORDER — BUPIVACAINE-EPINEPHRINE 0.5% -1:200000 IJ SOLN
INTRAMUSCULAR | Status: DC | PRN
  Administered 2021-06-13: 30 mL

## 2021-06-13 MED ORDER — SODIUM CHLORIDE 0.9 % IV SOLN
2.0000 g | Freq: Once | INTRAVENOUS | Status: AC
  Administered 2021-06-13: 2 g via INTRAVENOUS
  Filled 2021-06-13: qty 20

## 2021-06-13 MED ORDER — METHOCARBAMOL 1000 MG/10ML IJ SOLN
500.0000 mg | Freq: Four times a day (QID) | INTRAMUSCULAR | Status: DC | PRN
Start: 2021-06-13 — End: 2021-06-14
  Filled 2021-06-13 (×2): qty 5

## 2021-06-13 MED ORDER — VENLAFAXINE HCL ER 150 MG PO CP24
150.0000 mg | ORAL_CAPSULE | Freq: Every day | ORAL | Status: DC
  Administered 2021-06-14: 150 mg via ORAL
  Filled 2021-06-13: qty 1

## 2021-06-13 MED ORDER — FENTANYL CITRATE (PF) 250 MCG/5ML IJ SOLN
INTRAMUSCULAR | Status: DC | PRN
  Administered 2021-06-13: 100 ug via INTRAVENOUS

## 2021-06-13 MED ORDER — PROCHLORPERAZINE EDISYLATE 10 MG/2ML IJ SOLN
10.0000 mg | INTRAMUSCULAR | Status: DC | PRN
Start: 2021-06-13 — End: 2021-06-14

## 2021-06-13 MED ORDER — AMISULPRIDE (ANTIEMETIC) 5 MG/2ML IV SOLN: 10.0000 mg | Freq: Once | INTRAVENOUS | Status: DC | PRN

## 2021-06-13 MED ORDER — ROCURONIUM BROMIDE 10 MG/ML (PF) SYRINGE
PREFILLED_SYRINGE | INTRAVENOUS | Status: DC | PRN
  Administered 2021-06-13: 60 mg via INTRAVENOUS

## 2021-06-13 MED ORDER — SUGAMMADEX SODIUM 200 MG/2ML IV SOLN
INTRAVENOUS | Status: DC | PRN
  Administered 2021-06-13: 300 mg via INTRAVENOUS

## 2021-06-13 MED ORDER — IOHEXOL 300 MG/ML  SOLN
100.0000 mL | Freq: Once | INTRAMUSCULAR | Status: AC | PRN
  Administered 2021-06-13: 100 mL via INTRAVENOUS

## 2021-06-13 MED ORDER — METRONIDAZOLE 500 MG/100ML IV SOLN
500.0000 mg | Freq: Once | INTRAVENOUS | Status: AC
  Administered 2021-06-13: 500 mg via INTRAVENOUS
  Filled 2021-06-13: qty 100

## 2021-06-13 MED ORDER — HYDROMORPHONE HCL 1 MG/ML IJ SOLN: 0.5000 mg | INTRAMUSCULAR | Status: DC | PRN

## 2021-06-13 MED ORDER — LEVOTHYROXINE SODIUM 112 MCG PO TABS
112.0000 ug | ORAL_TABLET | Freq: Every day | ORAL | Status: DC
  Administered 2021-06-14: 112 ug via ORAL
  Filled 2021-06-13: qty 1

## 2021-06-13 MED ORDER — GABAPENTIN 300 MG PO CAPS
300.0000 mg | ORAL_CAPSULE | Freq: Three times a day (TID) | ORAL | Status: DC
Start: 2021-06-13 — End: 2021-06-14
  Administered 2021-06-14 (×2): 300 mg via ORAL
  Filled 2021-06-13 (×2): qty 1

## 2021-06-13 MED ORDER — OXYCODONE HCL 5 MG PO TABS: 10.0000 mg | ORAL_TABLET | ORAL | Status: DC | PRN

## 2021-06-13 MED ORDER — ORAL CARE MOUTH RINSE: 15.0000 mL | Freq: Once | OROMUCOSAL | Status: AC

## 2021-06-13 MED ORDER — CELECOXIB 200 MG PO CAPS: 200.0000 mg | ORAL_CAPSULE | Freq: Once | ORAL | Status: AC

## 2021-06-13 SURGICAL SUPPLY — 64 items
ADH SKN CLS APL DERMABOND .7 (GAUZE/BANDAGES/DRESSINGS) ×1
ADH SKN CLS LQ APL DERMABOND (GAUZE/BANDAGES/DRESSINGS) ×1
APL PRP STRL LF DISP 70% ISPRP (MISCELLANEOUS) ×1
APPLIER CLIP 5 13 M/L LIGAMAX5 (MISCELLANEOUS)
APPLIER CLIP ROT 10 11.4 M/L (STAPLE)
APR CLP MED LRG 11.4X10 (STAPLE)
APR CLP MED LRG 5 ANG JAW (MISCELLANEOUS)
BAG COUNTER SPONGE SURGICOUNT (BAG) ×3 IMPLANT
BAG SPEC RTRVL LRG 6X4 10 (ENDOMECHANICALS) ×1
BAG SPNG CNTER NS LX DISP (BAG) ×1
CANISTER SUCT 3000ML PPV (MISCELLANEOUS) ×3 IMPLANT
CHLORAPREP W/TINT 26 (MISCELLANEOUS) ×3 IMPLANT
CLIP APPLIE 5 13 M/L LIGAMAX5 (MISCELLANEOUS) IMPLANT
CLIP APPLIE ROT 10 11.4 M/L (STAPLE) IMPLANT
COVER SURGICAL LIGHT HANDLE (MISCELLANEOUS) ×3 IMPLANT
CUTTER FLEX LINEAR 45M (STAPLE) ×1 IMPLANT
DERMABOND ADHESIVE PROPEN (GAUZE/BANDAGES/DRESSINGS) ×1
DERMABOND ADVANCED (GAUZE/BANDAGES/DRESSINGS) ×1
DERMABOND ADVANCED .7 DNX12 (GAUZE/BANDAGES/DRESSINGS) ×2 IMPLANT
DERMABOND ADVANCED .7 DNX6 (GAUZE/BANDAGES/DRESSINGS) IMPLANT
DRAIN CHANNEL 19F RND (DRAIN) ×1 IMPLANT
DRSG TEGADERM 2-3/8X2-3/4 SM (GAUZE/BANDAGES/DRESSINGS) ×1 IMPLANT
ELECT REM PT RETURN 9FT ADLT (ELECTROSURGICAL) ×2
ELECTRODE REM PT RTRN 9FT ADLT (ELECTROSURGICAL) ×2 IMPLANT
EVACUATOR SILICONE 100CC (DRAIN) ×1 IMPLANT
GAUZE SPONGE 2X2 8PLY STRL LF (GAUZE/BANDAGES/DRESSINGS) IMPLANT
GLOVE SURG SIGNA 7.5 PF LTX (GLOVE) ×3 IMPLANT
GOWN STRL REUS W/ TWL LRG LVL3 (GOWN DISPOSABLE) ×4 IMPLANT
GOWN STRL REUS W/ TWL XL LVL3 (GOWN DISPOSABLE) ×2 IMPLANT
GOWN STRL REUS W/TWL LRG LVL3 (GOWN DISPOSABLE) ×2
GOWN STRL REUS W/TWL XL LVL3 (GOWN DISPOSABLE) ×2
GRASPER SUT TROCAR 14GX15 (MISCELLANEOUS) ×1 IMPLANT
KIT BASIN OR (CUSTOM PROCEDURE TRAY) ×3 IMPLANT
KIT TURNOVER KIT B (KITS) ×3 IMPLANT
NDL INSUFFLATION 14GA 120MM (NEEDLE) IMPLANT
NEEDLE INSUFFLATION 14GA 120MM (NEEDLE) ×2 IMPLANT
NS IRRIG 1000ML POUR BTL (IV SOLUTION) ×3 IMPLANT
PAD ARMBOARD 7.5X6 YLW CONV (MISCELLANEOUS) ×6 IMPLANT
POUCH SPECIMEN RETRIEVAL 10MM (ENDOMECHANICALS) ×3 IMPLANT
RELOAD 45 VASCULAR/THIN (ENDOMECHANICALS) ×2 IMPLANT
RELOAD STAPLE 45 2.5 WHT GRN (ENDOMECHANICALS) IMPLANT
RELOAD STAPLE 45 3.5 BLU ETS (ENDOMECHANICALS) IMPLANT
RELOAD STAPLE 60 2.6 WHT THN (STAPLE) IMPLANT
RELOAD STAPLE TA45 3.5 REG BLU (ENDOMECHANICALS) IMPLANT
RELOAD STAPLER WHITE 60MM (STAPLE) ×1 IMPLANT
SET IRRIG TUBING LAPAROSCOPIC (IRRIGATION / IRRIGATOR) ×3 IMPLANT
SET TUBE SMOKE EVAC HIGH FLOW (TUBING) ×3 IMPLANT
SHEARS HARMONIC ACE PLUS 36CM (ENDOMECHANICALS) ×3 IMPLANT
SLEEVE ENDOPATH XCEL 5M (ENDOMECHANICALS) ×3 IMPLANT
SPECIMEN JAR SMALL (MISCELLANEOUS) ×3 IMPLANT
SPONGE GAUZE 2X2 STER 10/PKG (GAUZE/BANDAGES/DRESSINGS) ×1
STAPLE ECHEON FLEX 60 POW ENDO (STAPLE) ×1 IMPLANT
STAPLER RELOAD WHITE 60MM (STAPLE) ×2
SUT ETHILON 2 0 FS 18 (SUTURE) ×1 IMPLANT
SUT MON AB 4-0 PC3 18 (SUTURE) ×3 IMPLANT
SUT VIC AB 2-0 SH 27 (SUTURE) ×2
SUT VIC AB 2-0 SH 27X BRD (SUTURE) IMPLANT
TOWEL GREEN STERILE (TOWEL DISPOSABLE) ×3 IMPLANT
TOWEL GREEN STERILE FF (TOWEL DISPOSABLE) ×3 IMPLANT
TRAY LAPAROSCOPIC MC (CUSTOM PROCEDURE TRAY) ×3 IMPLANT
TROCAR XCEL 12X100 BLDLESS (ENDOMECHANICALS) ×1 IMPLANT
TROCAR XCEL BLUNT TIP 100MML (ENDOMECHANICALS) ×3 IMPLANT
TROCAR XCEL NON-BLD 5MMX100MML (ENDOMECHANICALS) ×3 IMPLANT
WATER STERILE IRR 1000ML POUR (IV SOLUTION) ×3 IMPLANT

## 2021-06-13 NOTE — Progress Notes (Signed)
Acute appendicitis.  Recommend laparoscopic appendectomy.  Discussed surgery, risks, benefits and alternatives with patient and son in pre-op.  Will proceed as scheduled. ? ?Felicie Morn, MD ?General, Bariatric and Minimally Invasive Surgery ?[facility], [place] ? ?

## 2021-06-13 NOTE — Anesthesia Preprocedure Evaluation (Addendum)
Anesthesia Evaluation  ?Patient identified by MRN, date of birth, ID band ?Patient awake ? ? ? ?Reviewed: ?Allergy & Precautions, H&P , NPO status , Patient's Chart, lab work & pertinent test results, Unable to perform ROS - Chart review only ? ?Airway ?Mallampati: II ? ? ?Neck ROM: full ? ? ? Dental ?  ?Pulmonary ?neg pulmonary ROS,  ?  ?breath sounds clear to auscultation ? ? ? ? ? ? Cardiovascular ?hypertension, Pt. on home beta blockers ? ?Rhythm:regular Rate:Normal ? ? ?  ?Neuro/Psych ?PSYCHIATRIC DISORDERS Anxiety negative neurological ROS ?   ? GI/Hepatic ?negative GI ROS, Neg liver ROS,   ?Endo/Other  ?diabetesHypothyroidism Morbid obesity ? Renal/GU ?negative Renal ROS  ? ?  ?Musculoskeletal ?negative musculoskeletal ROS ?(+)  ? Abdominal ?(+) + obese,   ?Peds ? Hematology ? ?(+) Blood dyscrasia, anemia ,   ?Anesthesia Other Findings ? ? Reproductive/Obstetrics ? ?  ? ? ? ? ? ? ? ? ? ? ? ? ? ?  ?  ? ? ? ? ? ? ? ?Anesthesia Physical ?Anesthesia Plan ? ?ASA: 3 ? ?Anesthesia Plan: General  ? ?Post-op Pain Management: Ofirmev IV (intra-op)*  ? ?Induction: Intravenous, Rapid sequence and Cricoid pressure planned ? ?PONV Risk Score and Plan: 4 or greater and Ondansetron, Dexamethasone, Treatment may vary due to age or medical condition and Midazolam ? ?Airway Management Planned: Oral ETT ? ?Additional Equipment:  ? ?Intra-op Plan:  ? ?Post-operative Plan: Extubation in OR ? ?Informed Consent: I have reviewed the patients History and Physical, chart, labs and discussed the procedure including the risks, benefits and alternatives for the proposed anesthesia with the patient or authorized representative who has indicated his/her understanding and acceptance.  ? ? ? ?Dental advisory given ? ?Plan Discussed with: CRNA, Anesthesiologist and Surgeon ? ?Anesthesia Plan Comments:   ? ? ? ? ? ?Anesthesia Quick Evaluation ? ?

## 2021-06-13 NOTE — ED Provider Notes (Signed)
?[facility] EMERGENCY DEPT ?Provider Note ? ? ?CSN: [id number] ?Arrival date & time: 06/13/21  1153 ? ?  ? ?History ? ?Chief Complaint  ?Patient presents with  ? Abdominal Pain  ? ? ?Marie Wilson is a 56 y.o. female who presents emergency department complaining of abdominal pain.  Patient states that her symptoms began last night, with pain in her lower abdomen.  Pain was initially intermittent, and is now more focused in her right lower quadrant.  She went to her primary doctor today, and was sent to the ER for imaging to rule out appendicitis.  Denies fever.  Has some mild nausea, and states she had 2 episodes of emesis last night.  No diarrhea. ? ? ?Abdominal Pain ?Associated symptoms: constipation, nausea and vomiting   ?Associated symptoms: no chest pain, no diarrhea, no dysuria, no fever, no hematuria and no shortness of breath   ? ?  ? ?Home Medications ?Prior to Admission medications   ?Medication Sig Start Date End Date Taking? Authorizing Provider  ?albuterol (PROAIR HFA) 108 (90 Base) MCG/ACT inhaler Inhale 2 puffs by mouth every 6 hours as needed for cough/wheezing 09/05/20     ?influenza vac split quadrivalent PF (FLUARIX QUADRIVALENT) 0.5 ML injection Inject into the muscle. 12/15/20     ?levothyroxine (SYNTHROID) 112 MCG tablet Take 1 tablet by mouth in the morning on an empty stomach daily 03/15/21     ?Multiple Vitamins-Minerals (MULTIVITAMIN PO) Take 1 tablet by mouth daily.    [provider]  ?propranolol ER (INDERAL LA) 60 MG 24 hr capsule Take 1 capsule by mouth daily. 03/15/21     ?tirzepatide (MOUNJARO) 5 MG/0.5ML Pen Inject 5 mg under the skin once a week as directed. 03/16/21     ?venlafaxine XR (EFFEXOR-XR) 150 MG 24 hr capsule Take 1 capsule by mouth daily with food 03/15/21     ?   ? ?Allergies    ?No known allergies   ? ?Review of Systems   ?Review of Systems  ?Constitutional:  Negative for fever.  ?Respiratory:  Negative for shortness of breath.    ?Cardiovascular:  Negative for chest pain.  ?Gastrointestinal:  Positive for abdominal pain, constipation, nausea and vomiting. Negative for blood in stool and diarrhea.  ?Genitourinary:  Negative for dysuria, flank pain, hematuria and urgency.  ?All other systems reviewed and are negative. ? ?Physical Exam ?Updated Vital Signs ?BP 95/84   Pulse 77   Temp 98.3 ?F (36.8 ?C)   Resp 18   Ht '5\' 6"'$  (1.676 m)   Wt (!) 147.4 kg   LMP  (LMP Unknown)   SpO2 97%   BMI 52.45 kg/m?  ?Physical Exam ?Vitals and nursing note reviewed.  ?Constitutional:   ?   Appearance: Normal appearance.  ?HENT:  ?   Head: Normocephalic and atraumatic.  ?Eyes:  ?   Conjunctiva/sclera: Conjunctivae normal.  ?Cardiovascular:  ?   Rate and Rhythm: Normal rate and regular rhythm.  ?Pulmonary:  ?   Effort: Pulmonary effort is normal. No respiratory distress.  ?   Breath sounds: Normal breath sounds.  ?Abdominal:  ?   General: There is no distension.  ?   Palpations: Abdomen is soft.  ?   Tenderness: There is abdominal tenderness in the right lower quadrant and suprapubic area. There is no right CVA tenderness, left CVA tenderness, guarding or rebound.  ?Skin: ?   General: Skin is warm and dry.  ?Neurological:  ?   General: No focal deficit present.  ?  Mental Status: She is alert.  ? ? ?ED Results / Procedures / Treatments   ?Labs ?(all labs ordered are listed, but only abnormal results are displayed) ?Labs Reviewed  ?CBC WITH DIFFERENTIAL/PLATELET - Abnormal; Notable for the following components:  ?    Result Value  ? WBC 19.8 (*)   ? RBC 5.46 (*)   ? Hemoglobin 15.5 (*)   ? HCT 47.1 (*)   ? Neutro Abs 15.3 (*)   ? Monocytes Absolute 1.2 (*)   ? Abs Immature Granulocytes 0.09 (*)   ? All other components within normal limits  ?URINALYSIS, ROUTINE W REFLEX MICROSCOPIC - Abnormal; Notable for the following components:  ? APPearance HAZY (*)   ? Protein, ur 30 (*)   ? Leukocytes,Ua LARGE (*)   ? Bacteria, UA MANY (*)   ? All other components  within normal limits  ?COMPREHENSIVE METABOLIC PANEL - Abnormal; Notable for the following components:  ? Glucose, Bld 100 (*)   ? Creatinine, Ser 1.02 (*)   ? All other components within normal limits  ?LIPASE, BLOOD - Abnormal; Notable for the following components:  ? Lipase <10 (*)   ? All other components within normal limits  ? ? ?EKG ?None ? ?Radiology ?CT ABDOMEN PELVIS W CONTRAST ? ?Result Date: 06/13/2021 ?CLINICAL DATA:  Abdominal pain, nausea and vomiting since last night history of right breast cancer EXAM: CT ABDOMEN AND PELVIS WITH CONTRAST TECHNIQUE: Multidetector CT imaging of the abdomen and pelvis was performed using the standard protocol following bolus administration of intravenous contrast. RADIATION DOSE REDUCTION: This exam was performed according to the departmental dose-optimization program which includes automated exposure control, adjustment of the mA and/or kV according to patient size and/or use of iterative reconstruction technique. CONTRAST:  158m OMNIPAQUE IOHEXOL 300 MG/ML  SOLN COMPARISON:  None. FINDINGS: Lower chest: No acute abnormality. Hepatobiliary: No focal liver abnormality is seen. Status post cholecystectomy. No biliary dilatation. Pancreas: Unremarkable. No pancreatic ductal dilatation or surrounding inflammatory changes. Spleen: Normal in size without significant abnormality. Adrenals/Urinary Tract: Adrenal glands are unremarkable. Kidneys are normal, without renal calculi, solid lesion, or hydronephrosis. Bladder is unremarkable. Stomach/Bowel: Stomach is within normal limits. Thickened appendix measuring up to 1.2 cm in caliber with adjacent fat stranding (series 2, image 65). No evidence of bowel wall thickening, distention, or inflammatory changes. Sigmoid diverticula. Vascular/Lymphatic: No significant vascular findings are present. No enlarged abdominal or pelvic lymph nodes. Reproductive: Status post hysterectomy. Other: No abdominal wall hernia or abnormality. No  ascites. Musculoskeletal: No acute or significant osseous findings. IMPRESSION: Thickened appendix measuring up to 1.2 cm in caliber with adjacent fat stranding. Findings are consistent with acute appendicitis. No evidence of perforation or abscess. Electronically Signed   By: ADelanna AhmadiM.D.   On: 06/13/2021 14:24   ? ?Procedures ?Procedures  ? ? ?Medications Ordered in ED ?Medications  ?cefTRIAXone (ROCEPHIN) 2 g in sodium chloride 0.9 % 100 mL IVPB (2 g Intravenous New Bag/Given 06/13/21 1442)  ?  And  ?metroNIDAZOLE (FLAGYL) IVPB 500 mg (500 mg Intravenous New Bag/Given 06/13/21 1444)  ?Chlorhexidine Gluconate Cloth 2 % PADS 6 each (has no administration in time range)  ?ondansetron (ZOFRAN) injection 4 mg (has no administration in time range)  ?morphine (PF) 2 MG/ML injection 2-4 mg (has no administration in time range)  ?0.9 % NaCl with KCl 20 mEq/ L  infusion (has no administration in time range)  ?iohexol (OMNIPAQUE) 300 MG/ML solution 100 mL (100 mLs Intravenous Contrast Given 06/13/21  1411)  ? ? ?ED Course/ Medical Decision Making/ A&P ?  ?                        ?Medical Decision Making ?Amount and/or Complexity of Data Reviewed ?Labs: ordered. ?Radiology: ordered. ? ?Risk ?Prescription drug management. ? ? ?This patient presents to the ED for concern of abdominal pain, this involves an extensive number of treatment options, and is a complaint that carries with it a high risk of complications and morbidity. The emergent differential diagnosis prior to evaluation includes, but is not limited to,  AAA, mesenteric ischemia, appendicitis, diverticulitis, DKA, gastritis/gastroenteritis, nephrolithiasis, pancreatitis, peritonitis, constipation, UTI, SBO/LBO, biliary disease, IBD/IBS, PUD, hepatitis, PID. This is not an exhaustive differential.  ? ?Past Medical History / Co-morbidities / Social History: ?Anxiety, hypothyroidism, HTN, diabetes diet controlled, HLD ? ?Additional history: ?Chart reviewed. Unable to  view note from [facility]. My attending physician received a call that patient was to receive abdominal imaging to rule out appendicitis.  ? ?Physical Exam: ?Physical exam performed. The pertinent findings include: Ab

## 2021-06-13 NOTE — ED Triage Notes (Signed)
Patient here POV from Home. ? ?Endorses, Last PM, Cramping-Like Pain in Lower ABD. Pain is now more oriented to RLQ. Pain is Intermittent in [place]. ? ?Visited PCP today and sent for Imaging. No Known Fevers. Mild Nausea. 1-2 Episodes of Emesis Last PM. No Diarrhea. Possible Constipation.  ? ?NAD Noted during Triage. A&Ox4. GCS 15. Ambulatory. ?

## 2021-06-13 NOTE — Op Note (Signed)
? ?Patient: Marie Wilson (03-22-1965, [id number]) ? ?Date of Surgery: 06/13/2021  ? ?Preoperative Diagnosis: Appendicitis  ? ?Postoperative Diagnosis: Appendicitis  ? ?Surgical Procedure: APPENDECTOMY LAPAROSCOPIC:   ? ?Operative Team Members:  ?Surgeon(s) and Role: ?   * Rhyatt Muska, Nickola Major, MD - Primary  ? ?Anesthesiologist: Albertha Ghee, MD ?CRNA: Alain Marion, CRNA; Imagene Riches, CRNA  ? ?Anesthesia: General  ? ?Fluids:  ?Total I/O ?In: 1000 [I.V.:1000] ?Out: -  ? ?Complications: None ? ?Drains:  (19 Fr) Blake drain(s) in the right pericolic gutter   ? ?Specimen:  ?ID Type Source Tests Collected by Time Destination  ?1 : appendix GI Appendix SURGICAL PATHOLOGY Seif Teichert, Nickola Major, MD 06/13/2021 2055   ?  ? ?Disposition:  PACU - hemodynamically stable. ? ?Plan of Care: Admit for overnight observation ? ? ? ?Indications for Procedure: Marie Wilson is a 56 y.o. female who presented with abdominal pain.  History, physical and imaging was concerning for appendicitis, so laparoscopic appendectomy was recommended for the patient.  The procedure itself, as well as the risks, benefits and alternatives were discussed with the patient.  Risks discussed included but were not limited to the risk of bleeding, infection, damage to nearby structures, need to convert to open procedure, incisional hernia, and the need for additional procedures or surgeries.  With this discussion complete and all questions answered the patient granted consent to proceed. ? ?Findings: Inflamed appendix.  Purulence in the pelvis ? ?Infection status: ?Patient: Marie Wilson Emergency General Surgery Service Patient ?Case: Emergent ?Infection Present At Time Of Surgery (PATOS): Purulent Peritonitis ? ? ?Description of Procedure:  ? ?On the date stated above, the patient was taken to the operating room suite and placed in supine positioning with the left arm tucked.  Sequential compression devices were placed on the lower extremities to  prevent blood clots.  General endotracheal anesthesia was induced.  The patient urinated just prior to surgery so a foley catheter was not placed.  Preoperative antibiotics were given.  The patient's abdomen was prepped and draped in the usual sterile fashion.  A time-out was completed verifying the correct patient, procedure, positioning and equipment needed for the case. ? ?We began by anesthetizing the skin with local anesthetic and then making a 5 mm incision just below the umbilicus.  We dissected through the subcutaneous tissues to the fascia.  The fascia was grasped and elevated using a Kocher clamp.  A Veress needle was inserted into the abdomen and the abdomen was insufflated to 15 mmHg.  A 5 mm trocar was inserted in this position under optical guidance and then the abdomen was inspected.  There was no trauma to the underlying viscera with initial trocar placement.  Any abnormal findings, other than inflammation in the right lower quadrant, are listed above in the findings section.  Two additional trocars were placed, one 5 mm trocar suprapubically and one 12 mm trocar in the left lower quadrant.  These were placed under direct vision without any trauma to the underlying viscera.   ? ?The patient was then placed in head down, left side down positioning.  The appendix was identified and dissected free from its attachments to the abdominal wall, small intestine and cecum.  A window was created in the mesoappendix using blunt dissection.  The mesoappendix was divided using the harmonic scalpel.  The appendix was dissected off of the surrounding fatty tissue to mobilize away from the terminal ileum and isolate the base the appendix.  The appendix  was elevated and a 45 mm stapler was clamped on the base the appendix.  The appendix tore as this was clamped and the appendix was removed from the field as a specimen.  A 60 mm stapler was then brought onto the field.  A 2-0 Vicryl suture was placed through the hole  in the cecum where the base the appendix was.  I held up on this suture was able to fire a 60 mm white load of the endoscopic linear stapler across a healthy line of cecum.  The staple line was well formed and hemostatic.  The appendix was removed using the Endo Catch bag.  The cecal cuff and Vicryl suture were removed.  A suction irrigator was used to clean the operative field.  There was good hemostasis.  A 19 French channel drain was brought through the suprapubic port site and positioned in the operative field in the right paracolic gutter.  It was secured to the skin using a 2-0 nylon suture.  The 12 mm port site was closed at the fascial level using a 0 Vicryl suture on a PMI suture passer.  The skin was closed using subcuticular Monocryl suture and Dermabond.  All sponge needle counts were correct at the end of this case.  Sterile dressing was applied.  The patient was woke from anesthesia and transferred the postanesthesia care unit in stable condition. ? ?Louanna Raw, MD ?General, Bariatric, & Minimally Invasive Surgery ?[facility], [place] ? ?

## 2021-06-13 NOTE — Anesthesia Procedure Notes (Signed)
Procedure Name: Intubation ?Date/Time: 06/13/2021 8:27 PM ?Performed by: Imagene Riches, CRNA ?Pre-anesthesia Checklist: Patient identified, Emergency Drugs available, Suction available and Patient being monitored ?Patient Re-evaluated:Patient Re-evaluated prior to induction ?Oxygen Delivery Method: Circle System Utilized ?Preoxygenation: Pre-oxygenation with 100% oxygen ?Induction Type: IV induction, Rapid sequence and Cricoid Pressure applied ?Laryngoscope Size: Sabra Heck ?Grade View: Grade I ?Tube type: Oral ?Tube size: 7.0 mm ?Number of attempts: 1 ?Airway Equipment and Method: Stylet and Oral airway ?Placement Confirmation: ETT inserted through vocal cords under direct vision, positive ETCO2 and breath sounds checked- equal and bilateral ?Secured at: 21 cm ?Tube secured with: Tape ?Dental Injury: Teeth and Oropharynx as per pre-operative assessment  ? ? ? ? ?

## 2021-06-13 NOTE — Transfer of Care (Signed)
Immediate Anesthesia Transfer of Care Note ? ?Patient: Marie Wilson ? ?Procedure(s) Performed: APPENDECTOMY LAPAROSCOPIC (Abdomen) ? ?Patient Location: PACU ? ?Anesthesia Type:General ? ?Level of Consciousness: awake, alert  and oriented ? ?Airway & Oxygen Therapy: Patient Spontanous Breathing and Patient connected to face mask oxygen ? ?Post-op Assessment: Report given to RN and Post -op Vital signs reviewed and stable ? ?Post vital signs: Reviewed and stable ? ?Last Vitals:  ?Vitals Value Taken Time  ?BP 113/81 06/13/21 2150  ?Temp 36.4 ?C 06/13/21 2150  ?Pulse 80 06/13/21 2150  ?Resp 16 06/13/21 2152  ?SpO2 94 % 06/13/21 2150  ?Vitals shown include unvalidated device data. ? ?Last Pain:  ?Vitals:  ? 06/13/21 2150  ?TempSrc:   ?PainSc: 6   ?   ? ?  ? ?Complications: No notable events documented. ?

## 2021-06-13 NOTE — H&P (Signed)
[facility] ?Admission Note ? ?Marie Wilson ?November 10, 1965  ?[id number].   ? ?Requesting MD: Lennice Sites ?Chief Complaint/Reason for Consult: appendicitis  ? ?HPI:  ?Marie Wilson is a 56yo PMH HTN, HLD, GAD, and h/o breast cancer treated with lumpectomy, radiation, and tamoxifen, who was transferred from [facility] to [facility] for evaluation by general surgery for acute appendicitis. Patient reports acute onset abdominal pain that began last night. Pain was initially intermittent, now more constant and in the RLQ. Associated with mild nausea and 2 episodes of emesis last night. Denies fever, chills, diarrhea. ?In the ED her vital signs were found to be stable. CT scan shows a thickened appendix measuring up to 1.2 cm in caliber with adjacent fat stranding consistent with acute appendicitis; no evidence of perforation or abscess. WBC 19.8, Cr 1.02. ?She is a former breast cancer patient of Dr. Josetta Huddle.  She has had a previous robotic TAH/BSO ? ? ? ?Abdominal surgical history: c section, cholecystectomy ?Anticoagulants: none ? ?Review of Systems  ?Gastrointestinal:  Positive for abdominal pain, nausea and vomiting.  ? ?All systems reviewed and otherwise negative except for as above ? ?Family History  ?Problem Relation Age of Onset  ? Cancer Mother   ? Breast cancer Mother 10  ? Heart disease Father   ? Hyperlipidemia Father   ? Hypertension Father   ? Breast cancer Sister 25  ? Hodgkin's lymphoma Sister   ?     dx in her 29s  ? Lung cancer Maternal Uncle   ? Lung cancer Paternal Uncle   ? Breast cancer Cousin 68  ?     maternal first cousin  ? Thyroid cancer Cousin 56  ?     maternal first cousin  ? Heart disease Other   ? ? ?Past Medical History:  ?Diagnosis Date  ? Anemia   ? one time  ? Anxiety   ? Back pain   ? Breast cancer ([place])   ? right 2017  ? Breast cancer of upper-inner quadrant of right female breast ([place]) 10/28/2015  ? Cancer [facility])   ? skin  ? Diabetes mellitus without complication ([place])   ?  diet controlled- recent A1C=5  ? Family history of breast cancer   ? GAD (generalized anxiety disorder)   ? History of breast cancer   ? History of radiation therapy 01/19/16-03/07/16  ? right breast 50.4 Gy in 28 fractions, right breast boost 10 Gy in 5 fractions  ? Hyperlipidemia   ? Hypertension   ? uses inderal for anxiety prevention which helps maintain BP  ? Hypothyroidism   ? Other fatigue   ? Personal history of radiation therapy   ? Pneumonia   ? Prediabetes   ? Shortness of breath on exertion   ? ? ?Past Surgical History:  ?Procedure Laterality Date  ? BARTHOLIN GLAND CYST EXCISION    ? BREAST BIOPSY Right 10/2015  ? BREAST BIOPSY Right 2020  ? x2  ? BREAST LUMPECTOMY Right   ? right 2017  ? BREAST LUMPECTOMY WITH RADIOACTIVE SEED AND SENTINEL LYMPH NODE BIOPSY Right 12/08/2015  ? Procedure: BREAST LUMPECTOMY WITH RADIOACTIVE SEED AND SENTINEL LYMPH NODE BIOPSY; WITH LYMPHATIC MAPPING;  Surgeon: Erroll Luna, MD;  Location: [facility];  Service: General;  Laterality: Right;  ? CESAREAN SECTION    ? CHOLECYSTECTOMY    ? ROBOTIC ASSISTED TOTAL HYSTERECTOMY Bilateral 09/08/2014  ? Procedure: ROBOTIC ASSISTED TOTAL HYSTERECTOMY WITH BILATERAL SALPINGECTOMY WITH LYSIS OF ADHESIONS;  Surgeon: Princess Bruins, MD;  Location: [facility];  Service: Gynecology;  Laterality: Bilateral;  ? skin growth    ? ? ?Social History:  reports that she has never smoked. She has never used smokeless tobacco. She reports that she does not drink alcohol and does not use drugs. ? ?Allergies:  ?Allergies  ?Allergen Reactions  ? No Known Allergies   ? ? ?(Not in a hospital admission) ? ? ?Prior to Admission medications   ?Medication Sig Start Date End Date Taking? Authorizing Provider  ?levothyroxine (SYNTHROID) 112 MCG tablet Take 1 tablet by mouth in the morning on an empty stomach daily 03/15/21  Yes   ?Multiple Vitamins-Minerals (MULTIVITAMIN PO) Take 1 tablet by mouth daily.   Yes [provider]  ?propranolol ER (INDERAL LA)  60 MG 24 hr capsule Take 1 capsule by mouth daily. 03/15/21  Yes   ?tirzepatide (MOUNJARO) 5 MG/0.5ML Pen Inject 5 mg under the skin once a week as directed. 03/16/21  Yes   ?venlafaxine XR (EFFEXOR-XR) 150 MG 24 hr capsule Take 1 capsule by mouth daily with food 03/15/21  Yes   ?albuterol (PROAIR HFA) 108 (90 Base) MCG/ACT inhaler Inhale 2 puffs by mouth every 6 hours as needed for cough/wheezing 09/05/20     ?influenza vac split quadrivalent PF (FLUARIX QUADRIVALENT) 0.5 ML injection Inject into the muscle. 12/15/20     ? ? ?Blood pressure 118/89, pulse 79, temperature 98.3 ?F (36.8 ?C), resp. rate 17, height '5\' 6"'$  (1.676 m), weight (!) 147.4 kg, SpO2 98 %. ?Physical Exam: ?General: pleasant, WD/WN female who is laying in bed in NAD ?HEENT: head is normocephalic, atraumatic.  Sclera are noninjected.  Pupils equal and round reactive.  Ears and nose without any masses or lesions.  Mouth is pink and moist.  ?Heart: regular, rate, and rhythm.  Normal s1,s2. No obvious murmurs, gallops, or rubs noted.  Palpable pedal pulses bilaterally  ?Lungs: CTAB, no wheezes, rhonchi, or rales noted.  Respiratory effort nonlabored ?Abd: soft with tenderness and guarding in the RLQ ?MS: no BUE/BLE edema, calves soft and nontender ?Skin: warm and dry with no masses, lesions, or rashes ?Psych: A&Ox4 with an appropriate affect ? ?Results for orders placed or performed during the hospital encounter of 06/13/21 (from the past 48 hour(s))  ?CBC with Differential     Status: Abnormal  ? Collection Time: 06/13/21 12:45 PM  ?Result Value Ref Range  ? WBC 19.8 (H) 4.0 - 10.5 K/uL  ? RBC 5.46 (H) 3.87 - 5.11 MIL/uL  ? Hemoglobin 15.5 (H) 12.0 - 15.0 g/dL  ? HCT 47.1 (H) 36.0 - 46.0 %  ? MCV 86.3 80.0 - 100.0 fL  ? MCH 28.4 26.0 - 34.0 pg  ? MCHC 32.9 30.0 - 36.0 g/dL  ? RDW 13.2 11.5 - 15.5 %  ? Platelets 289 150 - 400 K/uL  ? nRBC 0.0 0.0 - 0.2 %  ? Neutrophils Relative % 77 %  ? Neutro Abs 15.3 (H) 1.7 - 7.7 K/uL  ? Lymphocytes Relative 15 %  ?  Lymphs Abs 3.1 0.7 - 4.0 K/uL  ? Monocytes Relative 6 %  ? Monocytes Absolute 1.2 (H) 0.1 - 1.0 K/uL  ? Eosinophils Relative 0 %  ? Eosinophils Absolute 0.1 0.0 - 0.5 K/uL  ? Basophils Relative 1 %  ? Basophils Absolute 0.1 0.0 - 0.1 K/uL  ? Immature Granulocytes 1 %  ? Abs Immature Granulocytes 0.09 (H) 0.00 - 0.07 K/uL  ?  Comment: Performed at [facility], [id number]  [address] [place], [place] [address]  ?Urinalysis, Routine w reflex microscopic Urine, Clean Catch     Status: Abnormal  ? Collection Time: 06/13/21 12:45 PM  ?Result Value Ref Range  ? Color, Urine YELLOW YELLOW  ? APPearance HAZY (A) CLEAR  ? Specific Gravity, Urine 1.020 1.005 - 1.030  ? pH 5.5 5.0 - 8.0  ? Glucose, UA NEGATIVE NEGATIVE mg/dL  ? Hgb urine dipstick NEGATIVE NEGATIVE  ? Bilirubin Urine NEGATIVE NEGATIVE  ? Ketones, ur NEGATIVE NEGATIVE mg/dL  ? Protein, ur 30 (A) NEGATIVE mg/dL  ? Nitrite NEGATIVE NEGATIVE  ? Leukocytes,Ua LARGE (A) NEGATIVE  ? RBC / HPF 21-50 0 - 5 RBC/hpf  ? WBC, UA 21-50 0 - 5 WBC/hpf  ? Bacteria, UA MANY (A) NONE SEEN  ? Squamous Epithelial / LPF 11-20 0 - 5  ? Mucus PRESENT   ? Hyaline Casts, UA PRESENT   ?  Comment: Performed at [facility], [address] [place], [place] [address]  ?Comprehensive metabolic panel     Status: Abnormal  ? Collection Time: 06/13/21  1:15 PM  ?Result Value Ref Range  ? Sodium 137 135 - 145 mmol/L  ? Potassium 3.6 3.5 - 5.1 mmol/L  ? Chloride 103 98 - 111 mmol/L  ? CO2 23 22 - 32 mmol/L  ? Glucose, Bld 100 (H) 70 - 99 mg/dL  ?  Comment: Glucose reference range applies only to samples taken after fasting for at least 8 hours.  ? BUN 9 6 - 20 mg/dL  ? Creatinine, Ser 1.02 (H) 0.44 - 1.00 mg/dL  ? Calcium 9.5 8.9 - 10.3 mg/dL  ? Total Protein 6.9 6.5 - 8.1 g/dL  ? Albumin 3.7 3.5 - 5.0 g/dL  ? AST 17 15 - 41 U/L  ? ALT 13 0 - 44 U/L  ? Alkaline Phosphatase 56 38 - 126 U/L  ? Total Bilirubin 1.1 0.3 - 1.2 mg/dL  ? GFR, Estimated >60 >60 mL/min  ?   Comment: (NOTE) ?Calculated using the CKD-EPI Creatinine Equation (2021) ?  ? Anion gap 11 5 - 15  ?  Comment: Performed at [facility], [address], [place], [place] [address]

## 2021-06-14 ENCOUNTER — Encounter (HOSPITAL_COMMUNITY): Payer: Self-pay | Admitting: Surgery

## 2021-06-14 ENCOUNTER — Other Ambulatory Visit (HOSPITAL_COMMUNITY): Payer: Self-pay

## 2021-06-14 LAB — CBC
HCT: 44.3 % (ref 36.0–46.0)
Hemoglobin: 14.2 g/dL (ref 12.0–15.0)
MCH: 28.7 pg (ref 26.0–34.0)
MCHC: 32.1 g/dL (ref 30.0–36.0)
MCV: 89.5 fL (ref 80.0–100.0)
Platelets: 290 10*3/uL (ref 150–400)
RBC: 4.95 MIL/uL (ref 3.87–5.11)
RDW: 13 % (ref 11.5–15.5)
WBC: 13.6 10*3/uL — ABNORMAL HIGH (ref 4.0–10.5)
nRBC: 0 % (ref 0.0–0.2)

## 2021-06-14 LAB — BASIC METABOLIC PANEL
Anion gap: 8 (ref 5–15)
BUN: 8 mg/dL (ref 6–20)
CO2: 24 mmol/L (ref 22–32)
Calcium: 8.6 mg/dL — ABNORMAL LOW (ref 8.9–10.3)
Chloride: 104 mmol/L (ref 98–111)
Creatinine, Ser: 1.11 mg/dL — ABNORMAL HIGH (ref 0.44–1.00)
GFR, Estimated: 58 mL/min — ABNORMAL LOW (ref >60)
Glucose, Bld: 156 mg/dL — ABNORMAL HIGH (ref 70–99)
Potassium: 3.7 mmol/L (ref 3.5–5.1)
Sodium: 136 mmol/L (ref 135–145)

## 2021-06-14 MED ORDER — SODIUM CHLORIDE 0.9 % IV BOLUS
500.0000 mL | Freq: Once | INTRAVENOUS | Status: AC
  Administered 2021-06-14: 500 mL via INTRAVENOUS

## 2021-06-14 MED ORDER — SODIUM CHLORIDE 0.9 % IV SOLN
INTRAVENOUS | Status: DC
Start: 2021-06-14 — End: 2021-06-14

## 2021-06-14 MED ORDER — OXYCODONE HCL 5 MG PO TABS
5.0000 mg | ORAL_TABLET | Freq: Four times a day (QID) | ORAL | 0 refills | Status: AC | PRN
  Filled 2021-06-14: qty 20, 5d supply, fill #0

## 2021-06-14 MED ORDER — ACETAMINOPHEN 325 MG PO TABS: 650.0000 mg | ORAL_TABLET | Freq: Four times a day (QID) | ORAL | Status: AC | PRN

## 2021-06-14 NOTE — Progress Notes (Signed)
Mobility Specialist Progress Note: ? ? 06/14/21 1243  ?Mobility  ?Activity Ambulated with assistance in hallway  ?Level of Assistance Independent after set-up  ?Assistive Device None  ?Distance Ambulated (ft) 2200 ft  ?Activity Response Tolerated well  ?$Mobility charge 1 Mobility  ? ?Pt received in bed willing to participate in mobility. Complaints of 4/10 pain in lower abdomen. Left in bathroom with call bell in reach and all needs met.  ? ?Carson Day ?Mobility Specialist ?Primary Phone [phone] ? ?

## 2021-06-14 NOTE — Discharge Instructions (Signed)
[facility] LAPAROSCOPIC SURGERY: POST OP INSTRUCTIONS Always review your discharge instruction sheet given to you by the facility where your surgery was performed. IF YOU HAVE DISABILITY OR FAMILY LEAVE FORMS, YOU MUST BRING THEM TO THE OFFICE FOR PROCESSING.   DO NOT GIVE THEM TO YOUR DOCTOR.  PAIN CONTROL  First take acetaminophen (Tylenol) AND/or ibuprofen (Advil) to control your pain after surgery.  Follow directions on package.  Taking acetaminophen (Tylenol) and/or ibuprofen (Advil) regularly after surgery will help to control your pain and lower the amount of prescription pain medication you may need.  You should not take more than 3,000 mg (3 grams) of acetaminophen (Tylenol) in 24 hours.  You should not take ibuprofen (Advil), aleve, motrin, naprosyn or other NSAIDS if you have a history of stomach ulcers or chronic kidney disease.  A prescription for pain medication may be given to you upon discharge.  Take your pain medication as prescribed, if you still have uncontrolled pain after taking acetaminophen (Tylenol) or ibuprofen (Advil). Use ice packs to help control pain. If you need a refill on your pain medication, please contact your pharmacy.  They will contact our office to request authorization. Prescriptions will not be filled after 5pm or on week-ends.  HOME MEDICATIONS Take your usually prescribed medications unless otherwise directed.  DIET You should follow a light diet the first few days after arrival home.  Be sure to include lots of fluids daily. Avoid fatty, fried foods.   CONSTIPATION It is common to experience some constipation after surgery and if you are taking pain medication.  Increasing fluid intake and taking a stool softener (such as Colace) will usually help or prevent this problem from occurring.  A mild laxative (Milk of Magnesia or Miralax) should be taken according to package instructions if there are no bowel movements after 48  hours.  WOUND/INCISION CARE Most patients will experience some swelling and bruising in the area of the incisions.  Ice packs will help.  Swelling and bruising can take several days to resolve.  Unless discharge instructions indicate otherwise, follow guidelines below  STERI-STRIPS - you may remove your outer bandages 48 hours after surgery, and you may shower at that time.  You have steri-strips (small skin tapes) in place directly over the incision.  These strips should be left on the skin for 7-10 days.   DERMABOND/SKIN GLUE - you may shower in 24 hours.  The glue will flake off over the next 2-3 weeks. Any sutures or staples will be removed at the office during your follow-up visit.  ACTIVITIES You may resume regular (light) daily activities beginning the next day--such as daily self-care, walking, climbing stairs--gradually increasing activities as tolerated.  You may have sexual intercourse when it is comfortable.  Refrain from any heavy lifting or straining until approved by your doctor. You may drive when you are no longer taking prescription pain medication, you can comfortably wear a seatbelt, and you can safely maneuver your car and apply brakes.  FOLLOW-UP You should see your doctor in the office for a follow-up appointment approximately 2-3 weeks after your surgery.  You should have been given your post-op/follow-up appointment when your surgery was scheduled.  If you did not receive a post-op/follow-up appointment, make sure that you call for this appointment within a day or two after you arrive home to insure a convenient appointment time.   WHEN TO CALL YOUR DOCTOR: Fever over 101.0 Inability to urinate Continued bleeding from incision.   Increased pain, redness, or drainage from the incision. Increasing abdominal pain  The clinic staff is available to answer your questions during regular business hours.  Please don't hesitate to call and ask to speak to one of the nurses for  clinical concerns.  If you have a medical emergency, go to the nearest emergency room or call 911.  A surgeon from [facility] is always on call at the hospital. [address], [address], [place], [place]  [address] ? [address] [address], [place], [place]   [address] [phone] ? [phone] ? FAX [phone] Web site: [email or web address]  

## 2021-06-14 NOTE — Progress Notes (Signed)
Pacu RN Report to floor given ? ?Gave report to [employer]. Room: 6N13.  Discussed surgery, meds given in OR and Pacu, VS, IV fluids given, EBL, urine output, pain and other pertinent information. Also discussed if pt had any family or friends here or belongings with them.  ? ?Discussed pt has 2 lapsites, one JP site, emptied 30 BRB. Gave Dilaudid '2mg'$ , Toradol during recovery and an hour later gave Morphine '2mg'$  IVP and Oxycodone '5mg'$  PO. Pain is low 3/10, VSS. Pt was on 2L [place], will leave off and assess when pt gets to 6N.  ? ?Son notified of Mom's room number. He will visit in AM.  ? ?Pt exits my care.   ?

## 2021-06-14 NOTE — Progress Notes (Signed)
Nsg Discharge Note ? ?Admit Date:  06/13/2021 ?Discharge date: 06/14/2021 ?  ?Marlane Mingle to be D/C'd Home per MD order.  AVS completed.   ?Removed IV-CDI. Reviewed d/c paperwork with patient. Reviewed aftercare for incisions. Sent patient home with supplies. Answered all question. Wheeled stable patient and belongings to main entrance where she was picked up by her son. ?Discharge Medication: ?Allergies as of 06/14/2021   ? ?   Reactions  ? No Known Allergies   ? ?  ? ?  ?Medication List  ?  ? ?STOP taking these medications   ? ?Fluarix Quadrivalent 0.5 ML injection ?Generic drug: influenza vac split quadrivalent PF ?  ? ?  ? ?TAKE these medications   ? ?acetaminophen 325 MG tablet ?Commonly known as: TYLENOL ?Take 2-3 tablets (650-975 mg total) by mouth every 6 (six) hours as needed. ?  ?albuterol 108 (90 Base) MCG/ACT inhaler ?Commonly known as: ProAir HFA ?Inhale 2 puffs by mouth every 6 hours as needed for cough/wheezing ?  ?Mounjaro 5 MG/0.5ML Pen ?Generic drug: tirzepatide ?Inject 5 mg under the skin once a week as directed. ?  ?MULTIVITAMIN PO ?Take 1 tablet by mouth daily. ?  ?oxyCODONE 5 MG immediate release tablet ?Commonly known as: Oxy IR/ROXICODONE ?Take 1 tablet (5 mg total) by mouth every 6 (six) hours as needed for moderate pain (pain not controlled with tylenol/ibuprofen). ?  ?propranolol ER 60 MG 24 hr capsule ?Commonly known as: INDERAL LA ?Take 1 capsule by mouth daily. ?  ?Synthroid 112 MCG tablet ?Generic drug: levothyroxine ?Take 1 tablet by mouth in the morning on an empty stomach daily ?  ?venlafaxine XR 150 MG 24 hr capsule ?Commonly known as: EFFEXOR-XR ?Take 1 capsule by mouth daily with food ?  ? ?  ? ? ?Discharge Assessment: ?Vitals:  ? 06/14/21 0737 06/14/21 0955  ?BP: 103/73   ?Pulse: (!) 57 69  ?Resp: 16   ?Temp: (!) 97.4 ?F (36.3 ?C)   ?SpO2: 93%   ? Skin clean, dry and intact without evidence of skin break down, no evidence of skin tears noted. ?IV catheter discontinued  intact. Site without signs and symptoms of complications - no redness or edema noted at insertion site, patient denies c/o pain - only slight tenderness at site.  Dressing with slight pressure applied. ? ?D/c Instructions-Education: ?Discharge instructions given to patient/family with verbalized understanding. ?D/c education completed with patient/family including follow up instructions, medication list, d/c activities limitations if indicated, with other d/c instructions as indicated by MD - patient able to verbalize understanding, all questions fully answered. ?Patient instructed to return to ED, call 911, or call MD for any changes in condition.  ?Patient escorted via [place], and D/C home via private auto. ? ?Santa Lighter, RN ?06/14/2021 4:29 PM  ?

## 2021-06-14 NOTE — Plan of Care (Signed)

## 2021-06-14 NOTE — Discharge Summary (Addendum)
[facility] ?Discharge Summary  ? ?Patient ID: ?Marie Wilson ?MRN: [id number] ?DOB/AGE: Nov 17, 1965 56 y.o. ? ?Admit date: 06/13/2021 ?Discharge date: 06/14/2021 ? ?Admitting Diagnosis: ?Acute appendicitis  ? ?Discharge Diagnosis ?Patient Active Problem List  ? Diagnosis Date Noted  ? Acute appendicitis 06/13/2021  ? Disordered eating 12/26/2020  ? Hypothyroidism 11/10/2020  ? GAD (generalized anxiety disorder) 11/10/2020  ? Diabetes mellitus ([place]) 11/10/2020  ? Acute hypoxemic respiratory failure due to COVID-19 [facility]) 02/21/2019  ? Class 3 severe obesity due to excess calories with serious comorbidity and body mass index (BMI) of 50.0 to 59.9 in adult [facility]) 02/18/2019  ? COVID-19 virus infection 02/18/2019  ? Genetic testing 11/22/2015  ? Family history of breast cancer   ? Breast cancer of upper-inner quadrant of right female breast ([place]) 10/28/2015  ? Postoperative state 09/08/2014  ? ? ?Consultants ?None ?Imaging: ?CT ABDOMEN PELVIS W CONTRAST ? ?Result Date: 06/13/2021 ?CLINICAL DATA:  Abdominal pain, nausea and vomiting since last night history of right breast cancer EXAM: CT ABDOMEN AND PELVIS WITH CONTRAST TECHNIQUE: Multidetector CT imaging of the abdomen and pelvis was performed using the standard protocol following bolus administration of intravenous contrast. RADIATION DOSE REDUCTION: This exam was performed according to the departmental dose-optimization program which includes automated exposure control, adjustment of the mA and/or kV according to patient size and/or use of iterative reconstruction technique. CONTRAST:  17m OMNIPAQUE IOHEXOL 300 MG/ML  SOLN COMPARISON:  None. FINDINGS: Lower chest: No acute abnormality. Hepatobiliary: No focal liver abnormality is seen. Status post cholecystectomy. No biliary dilatation. Pancreas: Unremarkable. No pancreatic ductal dilatation or surrounding inflammatory changes. Spleen: Normal in size without significant abnormality. Adrenals/Urinary  Tract: Adrenal glands are unremarkable. Kidneys are normal, without renal calculi, solid lesion, or hydronephrosis. Bladder is unremarkable. Stomach/Bowel: Stomach is within normal limits. Thickened appendix measuring up to 1.2 cm in caliber with adjacent fat stranding (series 2, image 65). No evidence of bowel wall thickening, distention, or inflammatory changes. Sigmoid diverticula. Vascular/Lymphatic: No significant vascular findings are present. No enlarged abdominal or pelvic lymph nodes. Reproductive: Status post hysterectomy. Other: No abdominal wall hernia or abnormality. No ascites. Musculoskeletal: No acute or significant osseous findings. IMPRESSION: Thickened appendix measuring up to 1.2 cm in caliber with adjacent fat stranding. Findings are consistent with acute appendicitis. No evidence of perforation or abscess. Electronically Signed   By: ADelanna AhmadiM.D.   On: 06/13/2021 14:24   ? ?Procedures ?Dr. PPeggye Pitt4/25/23 - laparoscopic appendectomy, drain placement ? ?Hospital Course:  ?56[facility], [facility]  Workup showed acute appendicitis.  Patient was admitted and underwent procedure listed above. Intraoperatively the appendix tore as it was was being stapled at the base causing a hole in the cecum, this was successfully stapled with a 60 mm stapler and the decision was made to leave a drain in place at the end of the case. The patient tolerated procedure well and was transferred to the floor.  Diet was advanced as tolerated.  On POD#1 the patient was voiding well, tolerating diet, ambulating well, pain well controlled, vital signs stable, incisions c/d/i and felt stable for discharge home.  Patient will follow up in our office in 2 weeks and knows to call with questions or concerns.  She will call to confirm appointment date/time.   ? ?Of note, the patient had a small bump in her creatinine post-operatively (1.11 from 1.02) suspect this was  in the setting of dehydration as  she reported poor PO intake in the 36 hours leading up to surgery. She was given an IVF bolus and IVF in the perioperative and post-operative period and encouraged to follow up with her PCP for repeat BMP within 1 week. ? ? I have personally reviewed the patients medication history on the [place] controlled substance database. ? ?Physical Exam: ?General:  Alert, NAD, pleasant, comfortable ?Abd:  Soft, appropriately tender, incisions c/d/I, drain with small amt serosanguinous drainage ? ?Allergies as of 06/14/2021   ? ?   Reactions  ? No Known Allergies   ? ?  ? ?  ?Medication List  ?  ? ?STOP taking these medications   ? ?Fluarix Quadrivalent 0.5 ML injection ?Generic drug: influenza vac split quadrivalent PF ?  ? ?  ? ?TAKE these medications   ? ?acetaminophen 325 MG tablet ?Commonly known as: TYLENOL ?Take 2-3 tablets (650-975 mg total) by mouth every 6 (six) hours as needed. ?  ?albuterol 108 (90 Base) MCG/ACT inhaler ?Commonly known as: ProAir HFA ?Inhale 2 puffs by mouth every 6 hours as needed for cough/wheezing ?  ?Mounjaro 5 MG/0.5ML Pen ?Generic drug: tirzepatide ?Inject 5 mg under the skin once a week as directed. ?  ?MULTIVITAMIN PO ?Take 1 tablet by mouth daily. ?  ?oxyCODONE 5 MG immediate release tablet ?Commonly known as: Oxy IR/ROXICODONE ?Take 1 tablet (5 mg total) by mouth every 6 (six) hours as needed for moderate pain (pain not controlled with tylenol/ibuprofen). ?  ?propranolol ER 60 MG 24 hr capsule ?Commonly known as: INDERAL LA ?Take 1 capsule by mouth daily. ?  ?Synthroid 112 MCG tablet ?Generic drug: levothyroxine ?Take 1 tablet by mouth in the morning on an empty stomach daily ?  ?venlafaxine XR 150 MG 24 hr capsule ?Commonly known as: EFFEXOR-XR ?Take 1 capsule by mouth daily with food ?  ? ?  ? ? ? ? Follow-up Information   ? ? Maczis, Puja Gosai, PA-C. Go on 06/29/2021.   ?Specialty: General Surgery ?Why: at 4:00 pm for post-operative follow up. please arrive  15 minutes early. ?Contact information: ?[address] ?[address] ?[place] [address] ?[phone] ? ? ?  ?  ? ? [facility], [place]. Go on 06/23/2021.   ?Specialty: General Surgery ?Why: at 10:30 AM for drain removal by a nurse. please arrive 20-30 minutes early. ?Contact information: ?[address] ?[address] ?[place] ?[phone] ? ?  ?  ? ?  ?  ? ?  ? ? ?Signed: ?Margie Billet, PA-C ?[facility] ?06/14/2021, 10:55 AM ? ?

## 2021-06-14 NOTE — Plan of Care (Signed)
?  Problem: Education: ?Goal: Knowledge of General Education information will improve ?Description: Including pain rating scale, medication(s)/side effects and non-pharmacologic comfort measures ?06/14/2021 1512 by Santa Lighter, RN ?Outcome: Adequate for Discharge ?06/14/2021 1303 by Santa Lighter, RN ?Outcome: Progressing ?  ?Problem: Health Behavior/Discharge Planning: ?Goal: Ability to manage health-related needs will improve ?06/14/2021 1512 by Santa Lighter, RN ?Outcome: Adequate for Discharge ?06/14/2021 1303 by Santa Lighter, RN ?Outcome: Progressing ?  ?Problem: Clinical Measurements: ?Goal: Ability to maintain clinical measurements within normal limits will improve ?06/14/2021 1512 by Santa Lighter, RN ?Outcome: Adequate for Discharge ?06/14/2021 1303 by Santa Lighter, RN ?Outcome: Progressing ?Goal: Will remain free from infection ?06/14/2021 1512 by Santa Lighter, RN ?Outcome: Adequate for Discharge ?06/14/2021 1303 by Santa Lighter, RN ?Outcome: Progressing ?Goal: Diagnostic test results will improve ?Outcome: Adequate for Discharge ?Goal: Respiratory complications will improve ?Outcome: Adequate for Discharge ?Goal: Cardiovascular complication will be avoided ?Outcome: Adequate for Discharge ?  ?Problem: Activity: ?Goal: Risk for activity intolerance will decrease ?Outcome: Adequate for Discharge ?  ?Problem: Nutrition: ?Goal: Adequate nutrition will be maintained ?Outcome: Adequate for Discharge ?  ?Problem: Coping: ?Goal: Level of anxiety will decrease ?Outcome: Adequate for Discharge ?  ?Problem: Elimination: ?Goal: Will not experience complications related to bowel motility ?Outcome: Adequate for Discharge ?Goal: Will not experience complications related to urinary retention ?Outcome: Adequate for Discharge ?  ?Problem: Pain Managment: ?Goal: General experience of comfort will improve ?Outcome: Adequate for Discharge ?  ?Problem: Safety: ?Goal: Ability to remain free from injury will  improve ?Outcome: Adequate for Discharge ?  ?Problem: Skin Integrity: ?Goal: Risk for impaired skin integrity will decrease ?Outcome: Adequate for Discharge ?  ?

## 2021-06-15 ENCOUNTER — Other Ambulatory Visit (HOSPITAL_COMMUNITY): Payer: Self-pay

## 2021-06-15 LAB — SURGICAL PATHOLOGY

## 2021-06-15 NOTE — Anesthesia Postprocedure Evaluation (Addendum)
Anesthesia Post Note ? ?Patient: Marie Wilson ? ?Procedure(s) Performed: APPENDECTOMY LAPAROSCOPIC (Abdomen) ? ?  ? ?Patient location during evaluation: PACU ?Anesthesia Type: General ?Level of consciousness: awake and alert ?Pain management: pain level controlled ?Vital Signs Assessment: post-procedure vital signs reviewed and stable ?Respiratory status: spontaneous breathing, nonlabored ventilation, respiratory function stable and patient connected to nasal cannula oxygen ?Cardiovascular status: blood pressure returned to baseline and stable ?Postop Assessment: no apparent nausea or vomiting ?Anesthetic complications: no ? ? ?No notable events documented. ? ?Last Vitals:  ?Vitals:  ? 06/14/21 0737 06/14/21 0955  ?BP: 103/73   ?Pulse: (!) 57 69  ?Resp: 16   ?Temp: (!) 36.3 ?C   ?SpO2: 93%   ?  ?Last Pain:  ?Vitals:  ? 06/14/21 1206  ?TempSrc:   ?PainSc: 3   ? ? ?  ?  ?  ?  ?  ?  ? ?[facility] ? ? ? ? ?

## 2021-06-19 ENCOUNTER — Other Ambulatory Visit (HOSPITAL_COMMUNITY): Payer: Self-pay

## 2021-06-19 MED ORDER — VENLAFAXINE HCL ER 150 MG PO CP24
ORAL_CAPSULE | ORAL | 2 refills | Status: AC
Start: 2021-06-19 — End: ?
  Filled 2021-06-19: qty 90, 90d supply, fill #0
  Filled 2021-09-13: qty 90, 90d supply, fill #1
  Filled 2021-10-21 – 2021-12-11 (×2): qty 90, 90d supply, fill #2

## 2021-06-19 MED ORDER — PROPRANOLOL HCL ER 60 MG PO CP24
60.0000 mg | ORAL_CAPSULE | Freq: Every day | ORAL | 2 refills | Status: AC
  Filled 2021-06-19: qty 90, 90d supply, fill #0
  Filled 2021-09-13: qty 90, 90d supply, fill #1
  Filled 2021-10-21 – 2021-12-11 (×2): qty 90, 90d supply, fill #2

## 2021-06-19 MED ORDER — LEVOTHYROXINE SODIUM 112 MCG PO TABS
ORAL_TABLET | ORAL | 2 refills | Status: AC
Start: 2021-06-19 — End: ?
  Filled 2021-06-19: qty 90, 90d supply, fill #0
  Filled 2021-09-13: qty 90, 90d supply, fill #1
  Filled 2021-10-21 – 2021-12-11 (×2): qty 90, 90d supply, fill #2

## 2021-06-20 ENCOUNTER — Other Ambulatory Visit (HOSPITAL_COMMUNITY): Payer: Self-pay

## 2021-06-20 MED ORDER — MOUNJARO 5 MG/0.5ML ~~LOC~~ SOAJ
SUBCUTANEOUS | 0 refills | Status: DC
  Filled 2021-06-20: qty 2, 28d supply, fill #0

## 2021-07-24 ENCOUNTER — Other Ambulatory Visit (HOSPITAL_COMMUNITY): Payer: Self-pay

## 2021-07-24 MED ORDER — MOUNJARO 5 MG/0.5ML ~~LOC~~ SOAJ
SUBCUTANEOUS | 0 refills | Status: AC
  Filled 2021-07-24: qty 2, 28d supply, fill #0

## 2021-08-21 ENCOUNTER — Other Ambulatory Visit (HOSPITAL_COMMUNITY): Payer: Self-pay

## 2021-08-21 MED ORDER — MOUNJARO 5 MG/0.5ML ~~LOC~~ SOAJ
SUBCUTANEOUS | 1 refills | Status: DC
  Filled 2021-08-21: qty 6, 90d supply, fill #0

## 2021-08-24 ENCOUNTER — Ambulatory Visit
Admission: RE | Admit: 2021-08-24 | Discharge: 2021-08-24 | Disposition: A | Discharge status: Home / Self Care | Payer: No Typology Code available for payment source | Source: Ambulatory Visit | Attending: Family Medicine | Admitting: Family Medicine

## 2021-08-24 ENCOUNTER — Other Ambulatory Visit: Payer: Self-pay | Admitting: Family Medicine

## 2021-08-24 ENCOUNTER — Other Ambulatory Visit (HOSPITAL_COMMUNITY): Payer: Self-pay

## 2021-08-24 DIAGNOSIS — M25511 Pain in right shoulder: Secondary | ICD-10-CM

## 2021-08-24 MED ORDER — WEGOVY 1 MG/0.5ML ~~LOC~~ SOAJ
SUBCUTANEOUS | 1 refills | Status: DC
  Filled 2021-08-24: qty 2, 28d supply, fill #0

## 2021-08-24 MED ORDER — METHOCARBAMOL 500 MG PO TABS
ORAL_TABLET | ORAL | 1 refills | Status: AC
  Filled 2021-08-24: qty 30, 15d supply, fill #0
  Filled 2021-09-13: qty 30, 15d supply, fill #1

## 2021-08-24 MED ORDER — MELOXICAM 15 MG PO TABS
ORAL_TABLET | ORAL | 1 refills | Status: AC
  Filled 2021-08-24: qty 30, 30d supply, fill #0

## 2021-08-28 ENCOUNTER — Other Ambulatory Visit (HOSPITAL_COMMUNITY): Payer: Self-pay

## 2021-08-28 MED ORDER — MOUNJARO 7.5 MG/0.5ML ~~LOC~~ SOAJ
SUBCUTANEOUS | 2 refills | Status: DC
  Filled 2021-08-28: qty 2, 28d supply, fill #0
  Filled 2021-09-18 – 2021-09-20 (×3): qty 2, 28d supply, fill #1
  Filled 2021-10-21: qty 2, 28d supply, fill #2
  Filled ????-??-??: fill #1

## 2021-08-30 ENCOUNTER — Ambulatory Visit
Admission: RE | Admit: 2021-08-30 | Discharge: 2021-08-30 | Disposition: A | Discharge status: Home / Self Care | Payer: No Typology Code available for payment source | Source: Ambulatory Visit | Attending: Hematology and Oncology | Admitting: Hematology and Oncology

## 2021-08-30 DIAGNOSIS — R921 Mammographic calcification found on diagnostic imaging of breast: Secondary | ICD-10-CM

## 2021-08-30 DIAGNOSIS — Z853 Personal history of malignant neoplasm of breast: Secondary | ICD-10-CM

## 2021-09-01 ENCOUNTER — Other Ambulatory Visit (HOSPITAL_COMMUNITY): Payer: Self-pay

## 2021-09-12 ENCOUNTER — Other Ambulatory Visit (HOSPITAL_COMMUNITY): Payer: Self-pay

## 2021-09-13 ENCOUNTER — Other Ambulatory Visit (HOSPITAL_COMMUNITY): Payer: Self-pay

## 2021-09-13 MED ORDER — ALBUTEROL SULFATE HFA 108 (90 BASE) MCG/ACT IN AERS
INHALATION_SPRAY | RESPIRATORY_TRACT | 1 refills | Status: AC
  Filled 2021-09-13: qty 6.7, 25d supply, fill #0

## 2021-09-18 ENCOUNTER — Other Ambulatory Visit (HOSPITAL_COMMUNITY): Payer: Self-pay

## 2021-09-19 ENCOUNTER — Other Ambulatory Visit (HOSPITAL_COMMUNITY): Payer: Self-pay

## 2021-09-20 ENCOUNTER — Other Ambulatory Visit (HOSPITAL_COMMUNITY): Payer: Self-pay

## 2021-09-22 ENCOUNTER — Encounter (HOSPITAL_BASED_OUTPATIENT_CLINIC_OR_DEPARTMENT_OTHER): Payer: No Typology Code available for payment source | Admitting: Sleep Medicine

## 2021-09-25 ENCOUNTER — Other Ambulatory Visit (HOSPITAL_COMMUNITY): Payer: Self-pay

## 2021-09-27 ENCOUNTER — Encounter (INDEPENDENT_AMBULATORY_CARE_PROVIDER_SITE_OTHER): Payer: Self-pay

## 2021-09-29 ENCOUNTER — Other Ambulatory Visit (HOSPITAL_COMMUNITY): Payer: Self-pay

## 2021-10-18 ENCOUNTER — Ambulatory Visit (HOSPITAL_BASED_OUTPATIENT_CLINIC_OR_DEPARTMENT_OTHER): Payer: No Typology Code available for payment source | Attending: Sleep Medicine | Admitting: Sleep Medicine

## 2021-10-18 VITALS — Ht 66.0 in | Wt 264.0 lb

## 2021-10-18 DIAGNOSIS — G4733 Obstructive sleep apnea (adult) (pediatric): Secondary | ICD-10-CM | POA: Insufficient documentation

## 2021-10-18 DIAGNOSIS — G4710 Hypersomnia, unspecified: Secondary | ICD-10-CM | POA: Diagnosis present

## 2021-10-18 DIAGNOSIS — R5383 Other fatigue: Secondary | ICD-10-CM | POA: Insufficient documentation

## 2021-10-18 DIAGNOSIS — R0683 Snoring: Secondary | ICD-10-CM

## 2021-10-21 ENCOUNTER — Other Ambulatory Visit (HOSPITAL_COMMUNITY): Payer: Self-pay

## 2021-11-13 ENCOUNTER — Other Ambulatory Visit (HOSPITAL_COMMUNITY): Payer: Self-pay

## 2021-11-13 MED ORDER — MOUNJARO 7.5 MG/0.5ML ~~LOC~~ SOAJ
7.5000 mg | SUBCUTANEOUS | 2 refills | Status: DC
  Filled 2021-11-13: qty 2, 28d supply, fill #0
  Filled 2021-12-11: qty 2, 28d supply, fill #1
  Filled 2022-01-04: qty 2, 28d supply, fill #2

## 2021-11-16 NOTE — Procedures (Signed)
    NAME: Marie Wilson DATE OF BIRTH:  [birth year] MEDICAL RECORD NUMBER [id number]  LOCATION: [facility]  PHYSICIAN: Dann Ventress D Ellasyn Swilling  DATE OF STUDY: 10/18/2021  SLEEP STUDY TYPE: Out of Center Sleep Test                REFERRING PHYSICIAN: Elmarie Mainland, MD  CLINICAL INFORMATION Marie Wilson is a 56 year old Female and was referred to the sleep center for evaluation of possible OSA.  MEDICATIONS Patient self administered medications include: N/A.  SLEEP STUDY TECHNIQUE A multi-channel overnight portable sleep study was performed. The channels recorded were: nasal and oral airflow, thoracic and abdominal respiratory movement, and oxygen saturation with a pulse oximetry. Snoring and body position were also monitored.  TECHNICIAN COMMENTS Comments added by Technician: N/A Comments added by Scorer: N/A RECORDING SUMMARY The study was initiated at 10:50:21 PM and terminated at 7:20:27 AM. The total recorded time was 510.1 minutes. Time in bed was 342.7 minutes. RESPIRATORY PARAMETERS There were a total of apneacount apneas ( 30 obstructive, 0 mixed, 0 central) and 16 hypopneas. The apnea/hypopnea index (AHI) was 8.1 events/hour. The central sleep apnea index was 0 events/hour. Respiratory disturbances were associated with oxygen desaturation down to a nadir of 87%. The mean oxygen saturation during the study was 92%. The cumulative time under or = 88% oxygen saturation was 0.1 minutes.  CARDIAC DATA Mean heart rate during sleep was 72.1 bpm.  IMPRESSIONS - Patient snored 0.5% during the sleep. - Mild Oxygen Desaturation - Mild Obstructive Sleep apnea(OSA) - Normal sleep efficiency, short primary sleep latency, long REM sleep latency and no slow wave latency. DIAGNOSIS - Obstructive Sleep Apnea (G47.33) RECOMMENDATIONS - Therapeutic CPAP titration to determine optimal pressure required to alleviate sleep disordered breathing. - Positional therapy  avoiding supine position during sleep. - Oral appliance may be considered. - Avoid alcohol, sedatives and other CNS depressants that may worsen sleep apnea and disrupt normal sleep architecture. - Sleep hygiene should be reviewed to assess factors that may improve sleep quality. - Weight management and regular exercise should be initiated or continued. - Return to Sleep Center for re-evaluation after 4 weeks of therapy - Patient may benefit from in-lab study   Marie Rengel D Sundiata Ferrick, MD [name]  ELECTRONICALLY SIGNED ON:  11/16/2021, 1:30 PM [place] PH: [phone]   FX: [phone] [place]

## 2021-11-28 ENCOUNTER — Other Ambulatory Visit (HOSPITAL_COMMUNITY): Payer: Self-pay

## 2021-12-05 ENCOUNTER — Other Ambulatory Visit: Payer: Self-pay

## 2021-12-06 ENCOUNTER — Other Ambulatory Visit (HOSPITAL_BASED_OUTPATIENT_CLINIC_OR_DEPARTMENT_OTHER): Payer: Self-pay

## 2021-12-06 MED ORDER — INFLUENZA VAC SPLIT QUAD 0.5 ML IM SUSY
PREFILLED_SYRINGE | INTRAMUSCULAR | 0 refills | Status: AC
  Filled 2021-12-06: qty 0.5, 1d supply, fill #0

## 2021-12-11 ENCOUNTER — Other Ambulatory Visit (HOSPITAL_COMMUNITY): Payer: Self-pay

## 2022-01-04 ENCOUNTER — Other Ambulatory Visit (HOSPITAL_COMMUNITY): Payer: Self-pay

## 2022-01-09 ENCOUNTER — Other Ambulatory Visit (HOSPITAL_COMMUNITY): Payer: Self-pay

## 2022-01-15 ENCOUNTER — Other Ambulatory Visit (HOSPITAL_COMMUNITY): Payer: Self-pay

## 2022-02-05 ENCOUNTER — Other Ambulatory Visit (HOSPITAL_COMMUNITY): Payer: Self-pay

## 2022-02-05 MED ORDER — MOUNJARO 7.5 MG/0.5ML ~~LOC~~ SOAJ
7.5000 mg | SUBCUTANEOUS | 0 refills | Status: DC
  Filled 2022-02-05 (×2): qty 2, 28d supply, fill #0

## 2022-02-06 ENCOUNTER — Other Ambulatory Visit: Payer: Self-pay

## 2022-02-17 ENCOUNTER — Other Ambulatory Visit (HOSPITAL_COMMUNITY): Payer: Self-pay

## 2022-02-20 DIAGNOSIS — E0590 Thyrotoxicosis, unspecified without thyrotoxic crisis or storm: Secondary | ICD-10-CM | POA: Diagnosis not present

## 2022-02-20 DIAGNOSIS — I10 Essential (primary) hypertension: Secondary | ICD-10-CM | POA: Diagnosis not present

## 2022-02-20 DIAGNOSIS — H524 Presbyopia: Secondary | ICD-10-CM | POA: Diagnosis not present

## 2022-02-20 DIAGNOSIS — H5213 Myopia, bilateral: Secondary | ICD-10-CM | POA: Diagnosis not present

## 2022-02-20 DIAGNOSIS — H52222 Regular astigmatism, left eye: Secondary | ICD-10-CM | POA: Diagnosis not present

## 2022-03-05 ENCOUNTER — Other Ambulatory Visit (HOSPITAL_COMMUNITY): Payer: Self-pay

## 2022-03-06 ENCOUNTER — Other Ambulatory Visit (HOSPITAL_COMMUNITY): Payer: Self-pay

## 2022-03-06 MED ORDER — MOUNJARO 7.5 MG/0.5ML ~~LOC~~ SOAJ
7.5000 mg | SUBCUTANEOUS | 0 refills | Status: AC
  Filled 2022-03-06: qty 2, 28d supply, fill #0

## 2022-03-07 ENCOUNTER — Other Ambulatory Visit (HOSPITAL_COMMUNITY): Payer: Self-pay

## 2022-03-19 ENCOUNTER — Other Ambulatory Visit (HOSPITAL_COMMUNITY): Payer: Self-pay

## 2022-03-19 DIAGNOSIS — F411 Generalized anxiety disorder: Secondary | ICD-10-CM | POA: Diagnosis not present

## 2022-03-19 DIAGNOSIS — Z13220 Encounter for screening for lipoid disorders: Secondary | ICD-10-CM | POA: Diagnosis not present

## 2022-03-19 DIAGNOSIS — R7303 Prediabetes: Secondary | ICD-10-CM | POA: Diagnosis not present

## 2022-03-19 DIAGNOSIS — Z853 Personal history of malignant neoplasm of breast: Secondary | ICD-10-CM | POA: Diagnosis not present

## 2022-03-19 DIAGNOSIS — E039 Hypothyroidism, unspecified: Secondary | ICD-10-CM | POA: Diagnosis not present

## 2022-03-19 DIAGNOSIS — Z6837 Body mass index (BMI) 37.0-37.9, adult: Secondary | ICD-10-CM | POA: Diagnosis not present

## 2022-03-19 DIAGNOSIS — Z0000 Encounter for general adult medical examination without abnormal findings: Secondary | ICD-10-CM | POA: Diagnosis not present

## 2022-03-19 MED ORDER — VENLAFAXINE HCL ER 150 MG PO CP24
150.0000 mg | ORAL_CAPSULE | Freq: Every day | ORAL | 3 refills | Status: DC
  Filled 2022-03-19: qty 90, 90d supply, fill #0
  Filled 2022-06-29: qty 90, 90d supply, fill #1
  Filled 2022-10-01: qty 90, 90d supply, fill #2
  Filled 2022-12-26 (×2): qty 90, 90d supply, fill #3

## 2022-03-19 MED ORDER — PROPRANOLOL HCL ER 60 MG PO CP24
60.0000 mg | ORAL_CAPSULE | Freq: Every day | ORAL | 3 refills | Status: DC
  Filled 2022-03-19: qty 90, 90d supply, fill #0
  Filled 2022-06-29: qty 90, 90d supply, fill #1
  Filled 2022-10-01: qty 90, 90d supply, fill #2
  Filled 2022-12-26 (×2): qty 90, 90d supply, fill #3

## 2022-03-19 MED ORDER — MOUNJARO 10 MG/0.5ML ~~LOC~~ SOAJ
10.0000 mg | SUBCUTANEOUS | 3 refills | Status: DC
  Filled 2022-03-19 – 2022-03-29 (×2): qty 2, 28d supply, fill #0
  Filled 2022-04-25: qty 2, 28d supply, fill #1
  Filled 2022-05-23: qty 2, 28d supply, fill #2
  Filled 2022-06-18 – 2022-06-22 (×2): qty 2, 28d supply, fill #3

## 2022-03-19 MED ORDER — LEVOTHYROXINE SODIUM 112 MCG PO TABS
112.0000 ug | ORAL_TABLET | Freq: Every morning | ORAL | 3 refills | Status: AC
  Filled 2022-03-19: qty 90, 90d supply, fill #0

## 2022-03-28 ENCOUNTER — Other Ambulatory Visit (HOSPITAL_COMMUNITY): Payer: Self-pay

## 2022-03-29 ENCOUNTER — Other Ambulatory Visit (HOSPITAL_COMMUNITY): Payer: Self-pay

## 2022-03-30 ENCOUNTER — Other Ambulatory Visit: Payer: Self-pay

## 2022-04-03 ENCOUNTER — Other Ambulatory Visit: Payer: Self-pay

## 2022-04-16 ENCOUNTER — Other Ambulatory Visit (HOSPITAL_COMMUNITY): Payer: Self-pay

## 2022-04-16 ENCOUNTER — Other Ambulatory Visit: Payer: Self-pay

## 2022-04-17 ENCOUNTER — Other Ambulatory Visit: Payer: Self-pay

## 2022-04-17 ENCOUNTER — Other Ambulatory Visit (HOSPITAL_BASED_OUTPATIENT_CLINIC_OR_DEPARTMENT_OTHER): Payer: Self-pay

## 2022-04-17 ENCOUNTER — Other Ambulatory Visit (HOSPITAL_COMMUNITY): Payer: Self-pay

## 2022-04-17 MED ORDER — LEVOTHYROXINE SODIUM 112 MCG PO TABS
112.0000 ug | ORAL_TABLET | Freq: Every morning | ORAL | 3 refills | Status: AC
  Filled 2022-04-17 (×2): qty 90, 90d supply, fill #0
  Filled 2022-06-29: qty 90, 90d supply, fill #1
  Filled 2022-10-01: qty 90, 90d supply, fill #2

## 2022-04-25 ENCOUNTER — Other Ambulatory Visit (HOSPITAL_COMMUNITY): Payer: Self-pay

## 2022-05-23 ENCOUNTER — Other Ambulatory Visit (HOSPITAL_COMMUNITY): Payer: Self-pay

## 2022-06-18 ENCOUNTER — Other Ambulatory Visit (HOSPITAL_COMMUNITY): Payer: Self-pay

## 2022-06-22 ENCOUNTER — Other Ambulatory Visit (HOSPITAL_COMMUNITY): Payer: Self-pay

## 2022-06-26 ENCOUNTER — Other Ambulatory Visit (HOSPITAL_COMMUNITY): Payer: Self-pay

## 2022-06-29 ENCOUNTER — Other Ambulatory Visit (HOSPITAL_COMMUNITY): Payer: Self-pay

## 2022-07-18 ENCOUNTER — Other Ambulatory Visit: Payer: Self-pay

## 2022-07-18 ENCOUNTER — Other Ambulatory Visit (HOSPITAL_COMMUNITY): Payer: Self-pay

## 2022-07-18 MED ORDER — MOUNJARO 10 MG/0.5ML ~~LOC~~ SOAJ
10.0000 mg | SUBCUTANEOUS | 3 refills | Status: AC
  Filled 2022-07-18 – 2022-08-20 (×2): qty 2, 28d supply, fill #0
  Filled 2022-09-13 (×2): qty 2, 28d supply, fill #1

## 2022-07-24 ENCOUNTER — Other Ambulatory Visit (HOSPITAL_COMMUNITY): Payer: Self-pay

## 2022-07-26 ENCOUNTER — Other Ambulatory Visit (HOSPITAL_COMMUNITY): Payer: Self-pay

## 2022-07-27 ENCOUNTER — Other Ambulatory Visit (HOSPITAL_COMMUNITY): Payer: Self-pay

## 2022-07-27 ENCOUNTER — Other Ambulatory Visit: Payer: Self-pay

## 2022-07-27 MED ORDER — MOUNJARO 7.5 MG/0.5ML ~~LOC~~ SOAJ
SUBCUTANEOUS | 3 refills | Status: AC
  Filled 2022-07-27: qty 2, 28d supply, fill #0

## 2022-07-30 ENCOUNTER — Other Ambulatory Visit (HOSPITAL_COMMUNITY): Payer: Self-pay

## 2022-07-31 ENCOUNTER — Other Ambulatory Visit (HOSPITAL_COMMUNITY): Payer: Self-pay

## 2022-08-20 ENCOUNTER — Other Ambulatory Visit (HOSPITAL_COMMUNITY): Payer: Self-pay

## 2022-08-20 ENCOUNTER — Other Ambulatory Visit: Payer: Self-pay

## 2022-08-30 ENCOUNTER — Other Ambulatory Visit: Payer: Self-pay | Admitting: Family Medicine

## 2022-08-30 DIAGNOSIS — Z853 Personal history of malignant neoplasm of breast: Secondary | ICD-10-CM

## 2022-09-13 ENCOUNTER — Other Ambulatory Visit (HOSPITAL_COMMUNITY): Payer: Self-pay

## 2022-09-14 ENCOUNTER — Other Ambulatory Visit: Payer: Self-pay | Admitting: Family Medicine

## 2022-09-14 DIAGNOSIS — Z853 Personal history of malignant neoplasm of breast: Secondary | ICD-10-CM

## 2022-09-17 ENCOUNTER — Ambulatory Visit
Admission: RE | Admit: 2022-09-17 | Discharge: 2022-09-17 | Disposition: A | Discharge status: Home / Self Care | Payer: 59 | Source: Ambulatory Visit | Attending: Family Medicine | Admitting: Family Medicine

## 2022-09-17 DIAGNOSIS — R921 Mammographic calcification found on diagnostic imaging of breast: Secondary | ICD-10-CM | POA: Diagnosis not present

## 2022-09-17 DIAGNOSIS — Z853 Personal history of malignant neoplasm of breast: Secondary | ICD-10-CM

## 2022-10-01 ENCOUNTER — Other Ambulatory Visit (HOSPITAL_COMMUNITY): Payer: Self-pay

## 2022-10-02 ENCOUNTER — Other Ambulatory Visit (HOSPITAL_COMMUNITY): Payer: Self-pay

## 2022-10-02 ENCOUNTER — Other Ambulatory Visit: Payer: Self-pay

## 2022-10-03 DIAGNOSIS — F411 Generalized anxiety disorder: Secondary | ICD-10-CM | POA: Diagnosis not present

## 2022-10-03 DIAGNOSIS — Z79899 Other long term (current) drug therapy: Secondary | ICD-10-CM | POA: Diagnosis not present

## 2022-10-03 DIAGNOSIS — E039 Hypothyroidism, unspecified: Secondary | ICD-10-CM | POA: Diagnosis not present

## 2022-10-03 DIAGNOSIS — R7303 Prediabetes: Secondary | ICD-10-CM | POA: Diagnosis not present

## 2022-10-03 DIAGNOSIS — Z6841 Body Mass Index (BMI) 40.0 and over, adult: Secondary | ICD-10-CM | POA: Diagnosis not present

## 2022-10-03 DIAGNOSIS — Z6832 Body mass index (BMI) 32.0-32.9, adult: Secondary | ICD-10-CM | POA: Diagnosis not present

## 2022-10-12 ENCOUNTER — Other Ambulatory Visit: Payer: Self-pay

## 2022-10-12 ENCOUNTER — Other Ambulatory Visit (HOSPITAL_COMMUNITY): Payer: Self-pay

## 2022-10-12 MED ORDER — MOUNJARO 10 MG/0.5ML ~~LOC~~ SOAJ
10.0000 mg | SUBCUTANEOUS | 5 refills | Status: DC
  Filled 2022-10-12: qty 2, 28d supply, fill #0
  Filled 2022-11-08: qty 2, 28d supply, fill #1

## 2022-11-08 ENCOUNTER — Other Ambulatory Visit (HOSPITAL_COMMUNITY): Payer: Self-pay

## 2022-11-30 ENCOUNTER — Other Ambulatory Visit (HOSPITAL_COMMUNITY): Payer: Self-pay

## 2022-11-30 MED ORDER — MOUNJARO 12.5 MG/0.5ML ~~LOC~~ SOAJ
12.5000 mg | SUBCUTANEOUS | 0 refills | Status: DC
  Filled 2022-11-30: qty 2, 28d supply, fill #0

## 2022-12-10 ENCOUNTER — Other Ambulatory Visit: Payer: Self-pay

## 2022-12-10 ENCOUNTER — Encounter (HOSPITAL_COMMUNITY): Payer: Self-pay

## 2022-12-10 ENCOUNTER — Other Ambulatory Visit (HOSPITAL_COMMUNITY): Payer: Self-pay

## 2022-12-26 ENCOUNTER — Other Ambulatory Visit (HOSPITAL_COMMUNITY): Payer: Self-pay

## 2022-12-26 ENCOUNTER — Other Ambulatory Visit: Payer: Self-pay

## 2022-12-31 ENCOUNTER — Other Ambulatory Visit (HOSPITAL_BASED_OUTPATIENT_CLINIC_OR_DEPARTMENT_OTHER): Payer: Self-pay

## 2022-12-31 DIAGNOSIS — E6609 Other obesity due to excess calories: Secondary | ICD-10-CM | POA: Diagnosis not present

## 2022-12-31 DIAGNOSIS — Z713 Dietary counseling and surveillance: Secondary | ICD-10-CM | POA: Diagnosis not present

## 2022-12-31 DIAGNOSIS — Z6832 Body mass index (BMI) 32.0-32.9, adult: Secondary | ICD-10-CM | POA: Diagnosis not present

## 2022-12-31 DIAGNOSIS — E66811 Obesity, class 1: Secondary | ICD-10-CM | POA: Diagnosis not present

## 2023-01-09 ENCOUNTER — Other Ambulatory Visit (HOSPITAL_COMMUNITY): Payer: Self-pay

## 2023-01-14 ENCOUNTER — Other Ambulatory Visit (HOSPITAL_COMMUNITY): Payer: Self-pay

## 2023-01-15 ENCOUNTER — Other Ambulatory Visit (HOSPITAL_COMMUNITY): Payer: Self-pay

## 2023-01-15 MED ORDER — MOUNJARO 12.5 MG/0.5ML ~~LOC~~ SOAJ
12.5000 mg | SUBCUTANEOUS | 0 refills | Status: DC
  Filled 2023-01-15: qty 2, 28d supply, fill #0
  Filled 2023-01-15: qty 6, 84d supply, fill #0
  Filled 2023-02-06: qty 2, 28d supply, fill #1
  Filled 2023-03-07: qty 2, 28d supply, fill #2

## 2023-02-07 ENCOUNTER — Other Ambulatory Visit: Payer: Self-pay

## 2023-02-07 ENCOUNTER — Other Ambulatory Visit (HOSPITAL_COMMUNITY): Payer: Self-pay

## 2023-02-07 MED ORDER — LEVOTHYROXINE SODIUM 100 MCG PO TABS
100.0000 ug | ORAL_TABLET | Freq: Every morning | ORAL | 3 refills | Status: AC
  Filled 2023-02-07: qty 90, 90d supply, fill #0
  Filled 2023-04-04: qty 90, 90d supply, fill #1
  Filled 2023-05-27: qty 90, 90d supply, fill #2
  Filled 2023-07-02: qty 90, 90d supply, fill #3

## 2023-02-08 ENCOUNTER — Other Ambulatory Visit: Payer: Self-pay

## 2023-03-08 ENCOUNTER — Other Ambulatory Visit: Payer: Self-pay

## 2023-03-20 ENCOUNTER — Other Ambulatory Visit: Payer: Self-pay

## 2023-04-01 DIAGNOSIS — E6609 Other obesity due to excess calories: Secondary | ICD-10-CM | POA: Diagnosis not present

## 2023-04-01 DIAGNOSIS — Z79899 Other long term (current) drug therapy: Secondary | ICD-10-CM | POA: Diagnosis not present

## 2023-04-01 DIAGNOSIS — E039 Hypothyroidism, unspecified: Secondary | ICD-10-CM | POA: Diagnosis not present

## 2023-04-01 DIAGNOSIS — Z6832 Body mass index (BMI) 32.0-32.9, adult: Secondary | ICD-10-CM | POA: Diagnosis not present

## 2023-04-01 DIAGNOSIS — B974 Respiratory syncytial virus as the cause of diseases classified elsewhere: Secondary | ICD-10-CM | POA: Diagnosis not present

## 2023-04-01 DIAGNOSIS — F411 Generalized anxiety disorder: Secondary | ICD-10-CM | POA: Diagnosis not present

## 2023-04-04 ENCOUNTER — Other Ambulatory Visit (HOSPITAL_COMMUNITY): Payer: Self-pay

## 2023-04-05 ENCOUNTER — Other Ambulatory Visit: Payer: Self-pay

## 2023-04-05 ENCOUNTER — Other Ambulatory Visit (HOSPITAL_COMMUNITY): Payer: Self-pay

## 2023-04-08 ENCOUNTER — Other Ambulatory Visit (HOSPITAL_COMMUNITY): Payer: Self-pay

## 2023-04-08 MED ORDER — PROPRANOLOL HCL ER 60 MG PO CP24
60.0000 mg | ORAL_CAPSULE | Freq: Every day | ORAL | 1 refills | Status: DC
  Filled 2023-04-08: qty 90, 90d supply, fill #0
  Filled 2023-07-02: qty 90, 90d supply, fill #1

## 2023-04-08 MED ORDER — MOUNJARO 12.5 MG/0.5ML ~~LOC~~ SOAJ
12.5000 mg | SUBCUTANEOUS | 0 refills | Status: DC
Start: 2023-04-08 — End: 2023-07-02
  Filled 2023-04-08: qty 6, 84d supply, fill #0

## 2023-04-08 MED ORDER — VENLAFAXINE HCL ER 150 MG PO CP24
150.0000 mg | ORAL_CAPSULE | Freq: Every day | ORAL | 1 refills | Status: DC
  Filled 2023-04-08: qty 90, 90d supply, fill #0
  Filled 2023-07-02: qty 90, 90d supply, fill #1

## 2023-04-11 ENCOUNTER — Other Ambulatory Visit (HOSPITAL_COMMUNITY): Payer: Self-pay

## 2023-05-28 ENCOUNTER — Other Ambulatory Visit: Payer: Self-pay

## 2023-05-28 ENCOUNTER — Other Ambulatory Visit (HOSPITAL_COMMUNITY): Payer: Self-pay

## 2023-06-17 DIAGNOSIS — L255 Unspecified contact dermatitis due to plants, except food: Secondary | ICD-10-CM | POA: Diagnosis not present

## 2023-06-17 DIAGNOSIS — Z6832 Body mass index (BMI) 32.0-32.9, adult: Secondary | ICD-10-CM | POA: Diagnosis not present

## 2023-06-18 IMAGING — MG MM DIGITAL DIAGNOSTIC UNILAT*R* W/ TOMO W/ CAD
6 series · 6 of 14 positions shown · non-contrast
Comparison: Previous exam(s).

CLINICAL DATA: Follow-up for probably benign calcifications near
the lumpectomy site in the RIGHT breast. These probably benign
calcifications were initially described on diagnostic mammogram
dated 08/26/2020.
TECHNIQUE: Right digital diagnostic mammography and breast tomosynthesis was
performed. The images were evaluated with computer-aided detection.

[R ML]
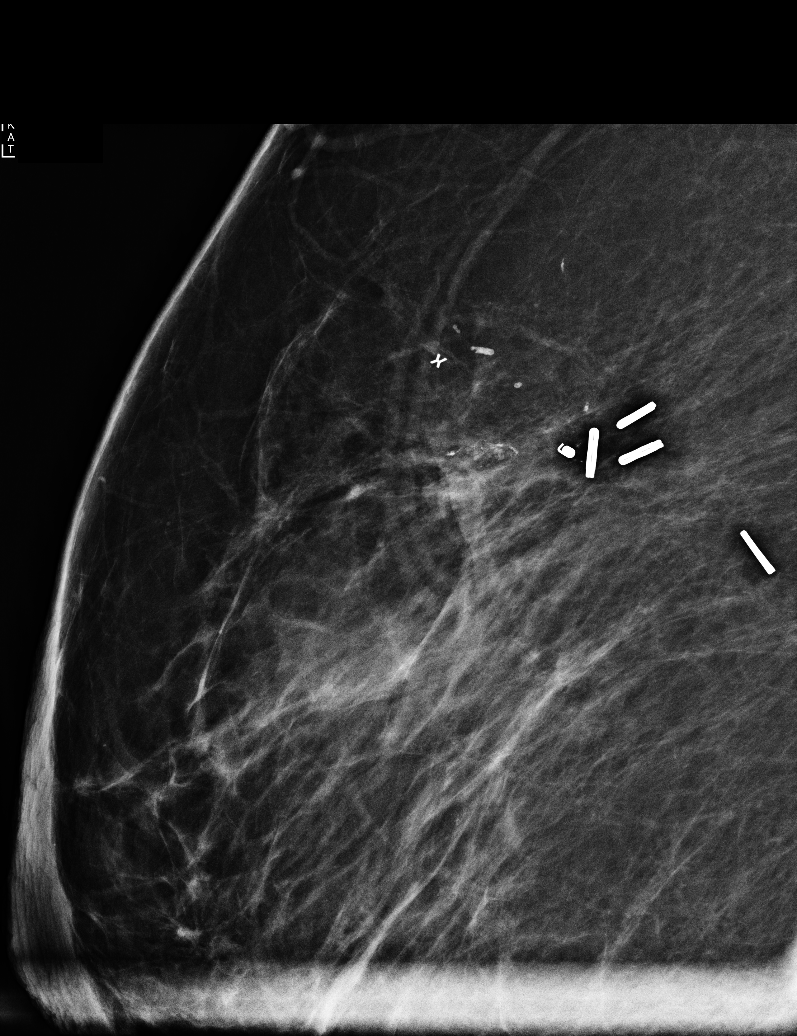

[R CC]
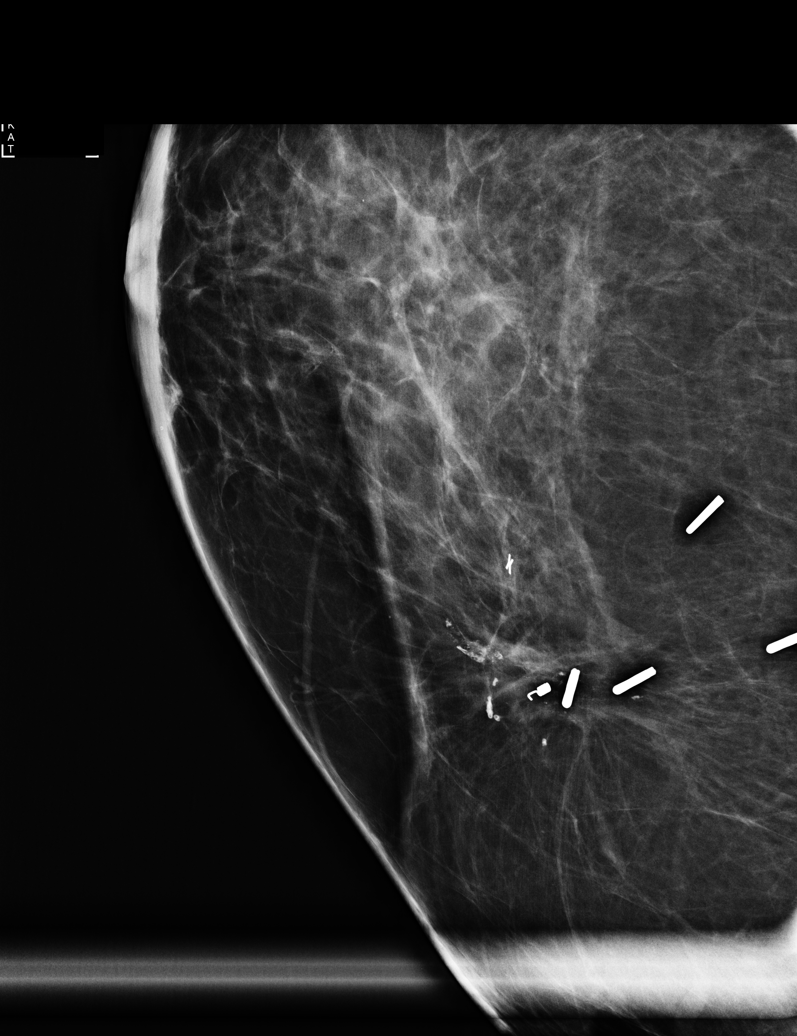

[R CC synth-2D]
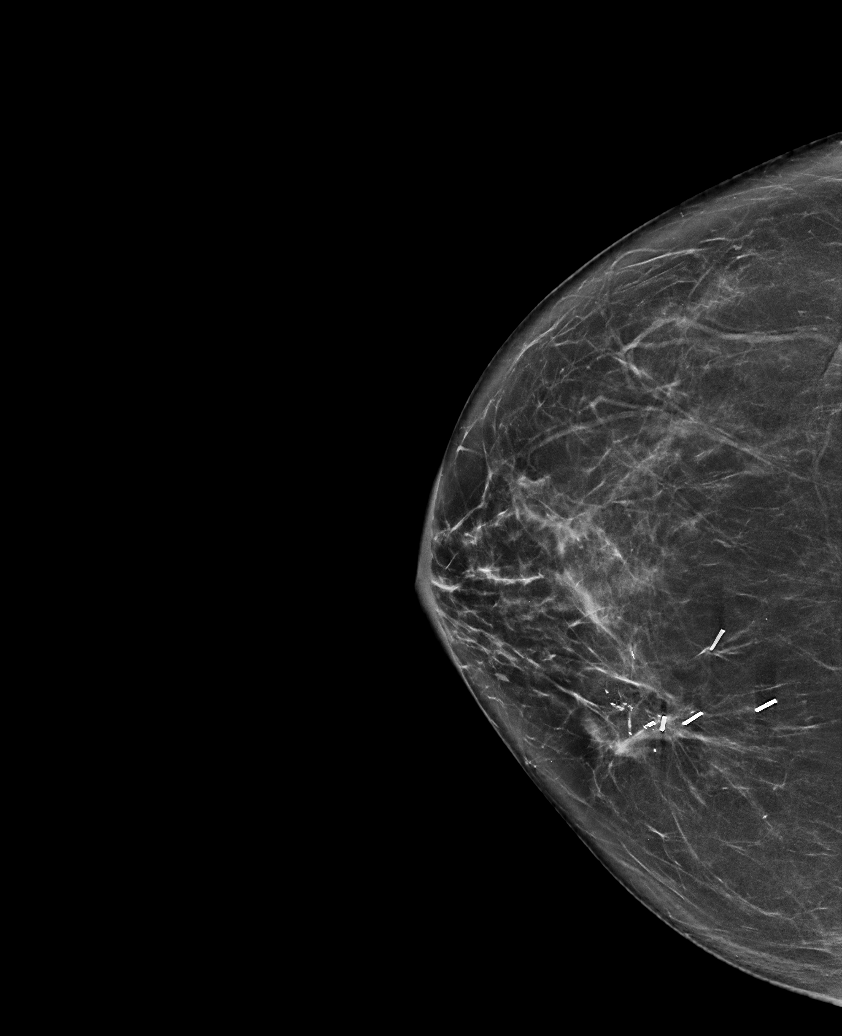

[R MLO synth-2D]
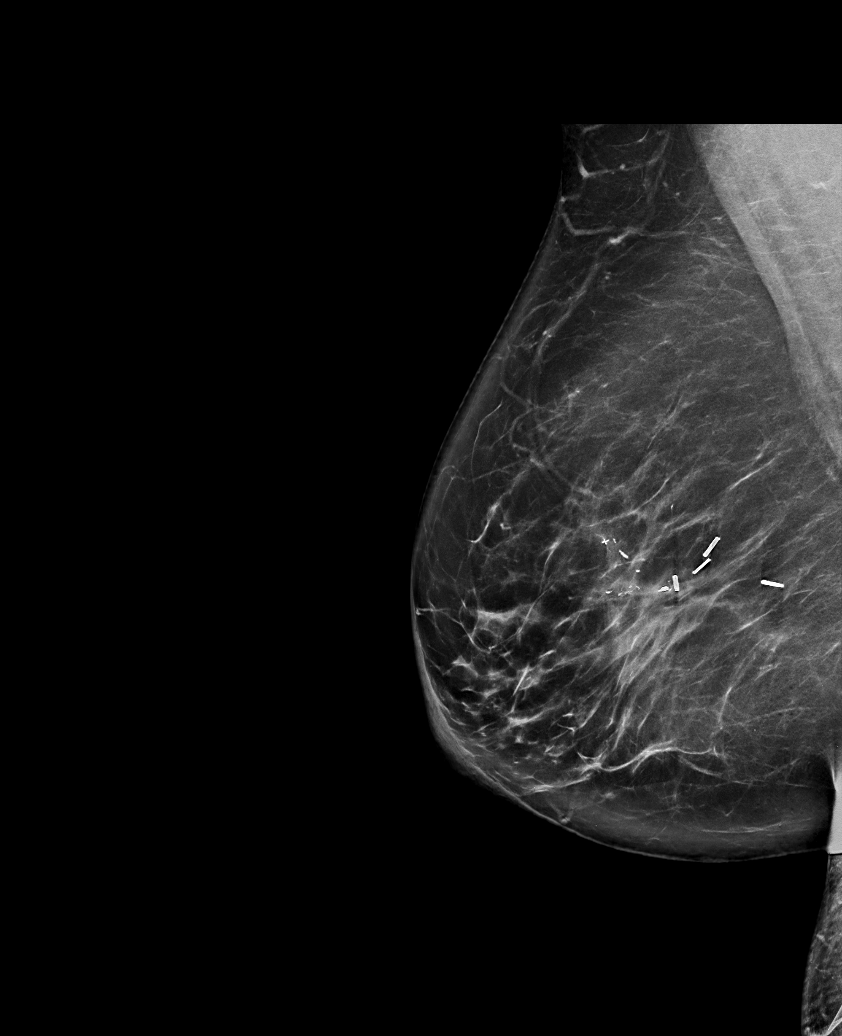

[R MLO tomo · tomo slice 46/91.0]
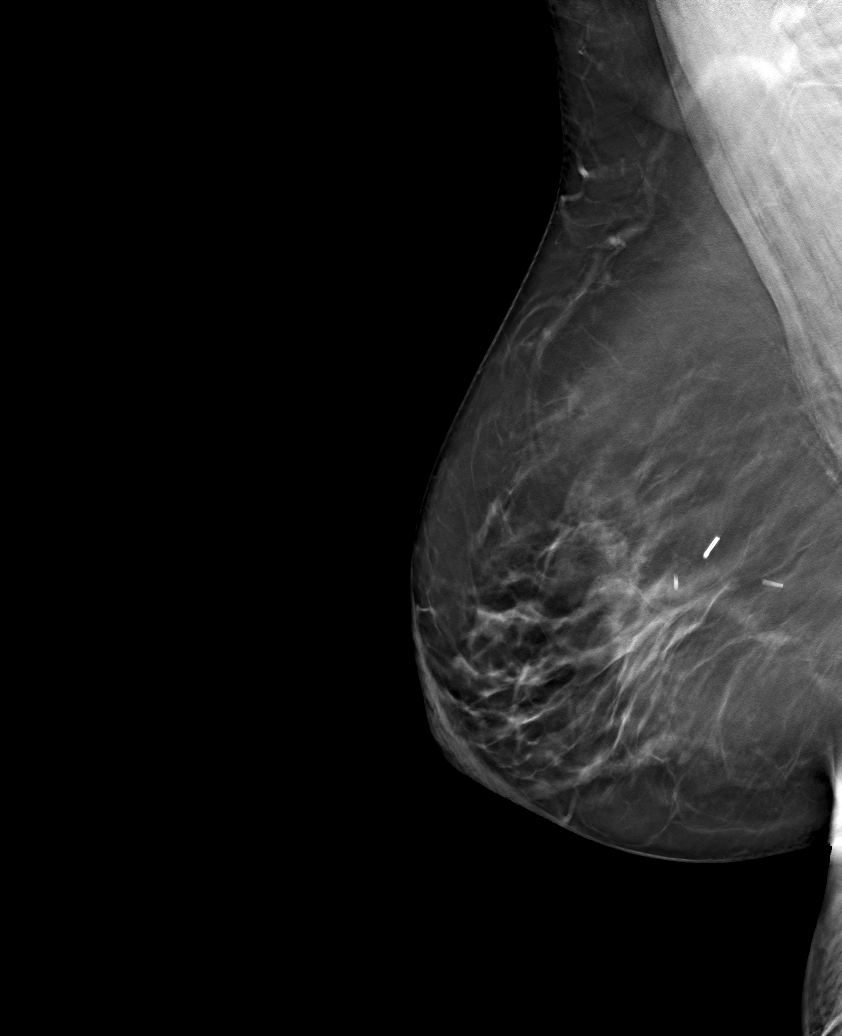

[R CC tomo · tomo slice 37/72.0]
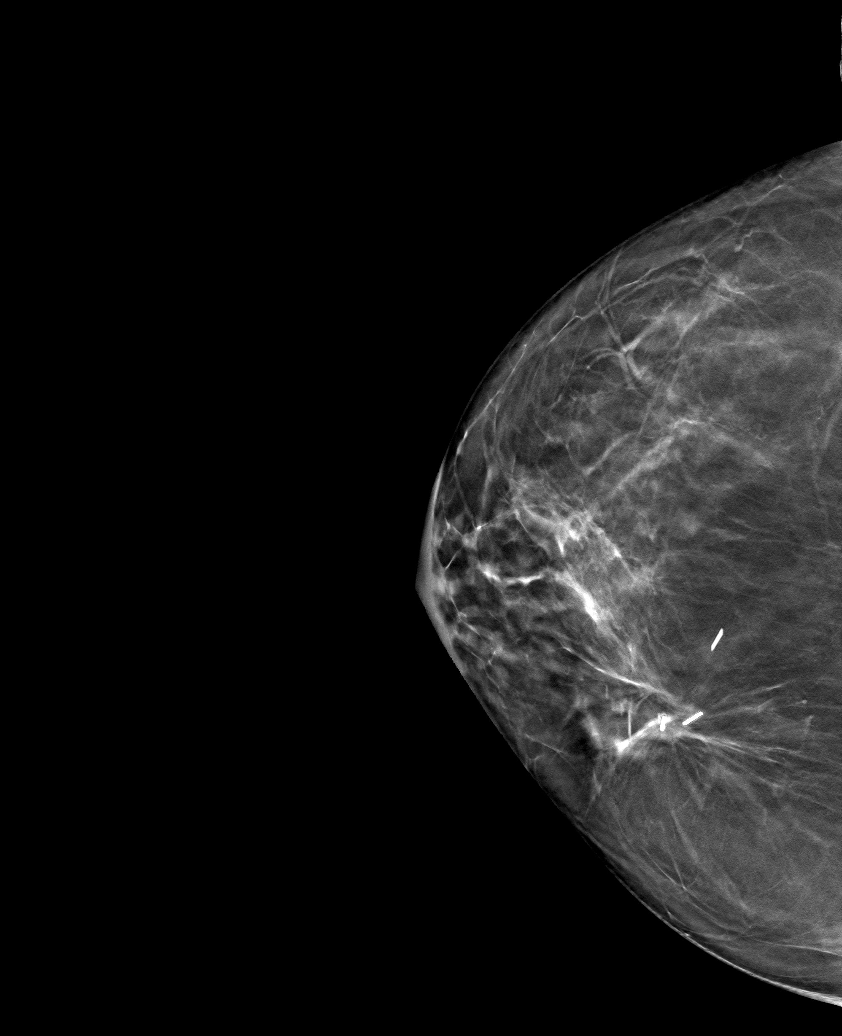

[6 of 14 positions shown; findings below may reference images not displayed]

Patient with a history of RIGHT breast cancer diagnosed in 4684
status post lumpectomy. Patient also has had stereotactic guided
biopsies in 5555 for other calcifications within the RIGHT breast
near the lumpectomy site which demonstrated benign pathology
results.

EXAM:
DIGITAL DIAGNOSTIC UNILATERAL RIGHT MAMMOGRAM WITH TOMOSYNTHESIS AND
CAD
ACR Breast Density Category b: There are scattered areas of
fibroglandular density.
FINDINGS: The coarse calcifications within the RIGHT breast near the
lumpectomy site are stable. Overall, the lumpectomy site is stable
in appearance. There are no new dominant masses, suspicious
calcifications or secondary signs of malignancy elsewhere within the
RIGHT breast.
IMPRESSION: Stable probably benign calcifications near the lumpectomy site in
the RIGHT breast. Recommend additional follow-up diagnostic
mammogram in 6 months to ensure continued stability.

RECOMMENDATION:
Bilateral diagnostic mammogram, with RIGHT breast magnification
views, in 6 months.

I have discussed the findings and recommendations with the patient.
If applicable, a reminder letter will be sent to the patient
regarding the next appointment.

BI-RADS CATEGORY  3: Probably benign.

## 2023-07-02 ENCOUNTER — Other Ambulatory Visit (HOSPITAL_COMMUNITY): Payer: Self-pay

## 2023-07-02 ENCOUNTER — Other Ambulatory Visit: Payer: Self-pay

## 2023-07-02 MED ORDER — MOUNJARO 12.5 MG/0.5ML ~~LOC~~ SOAJ
12.5000 mg | SUBCUTANEOUS | 0 refills | Status: DC
Start: 2023-07-02 — End: 2023-09-24
  Filled 2023-07-02: qty 6, 84d supply, fill #0

## 2023-09-20 ENCOUNTER — Other Ambulatory Visit (HOSPITAL_COMMUNITY): Payer: Self-pay

## 2023-09-24 ENCOUNTER — Other Ambulatory Visit: Payer: Self-pay

## 2023-09-24 ENCOUNTER — Other Ambulatory Visit (HOSPITAL_COMMUNITY): Payer: Self-pay

## 2023-09-24 MED ORDER — MOUNJARO 12.5 MG/0.5ML ~~LOC~~ SOAJ
12.5000 mg | SUBCUTANEOUS | 0 refills | Status: DC
  Filled 2023-09-24: qty 2, 28d supply, fill #0

## 2023-09-26 DIAGNOSIS — Z79899 Other long term (current) drug therapy: Secondary | ICD-10-CM | POA: Diagnosis not present

## 2023-09-26 DIAGNOSIS — F411 Generalized anxiety disorder: Secondary | ICD-10-CM | POA: Diagnosis not present

## 2023-09-26 DIAGNOSIS — Z1211 Encounter for screening for malignant neoplasm of colon: Secondary | ICD-10-CM | POA: Diagnosis not present

## 2023-09-26 DIAGNOSIS — E039 Hypothyroidism, unspecified: Secondary | ICD-10-CM | POA: Diagnosis not present

## 2023-09-26 DIAGNOSIS — E6609 Other obesity due to excess calories: Secondary | ICD-10-CM | POA: Diagnosis not present

## 2023-09-26 DIAGNOSIS — Z6832 Body mass index (BMI) 32.0-32.9, adult: Secondary | ICD-10-CM | POA: Diagnosis not present

## 2023-09-30 IMAGING — CT CT ABD-PELV W/ CM
2 of 5 series · 16 of 46 positions shown, 18 images · IV contrast (APPLIED)
Comparison: None.

CLINICAL DATA: Abdominal pain, nausea and vomiting since last night
history of right breast cancer

EXAM:
CT ABDOMEN AND PELVIS WITH CONTRAST
TECHNIQUE: Multidetector CT imaging of the abdomen and pelvis was performed
using the standard protocol following bolus administration of
intravenous contrast.

[Series 2: abd pel w · axial · 0.98mm/px · z∈[+672,+1107]mm · 13 of 99 slices shown, 15 images]
[im 6/99  soft-tissue]
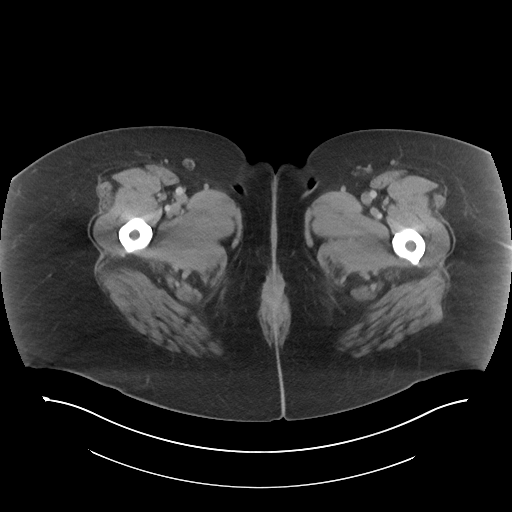
[im 6/99  bone]
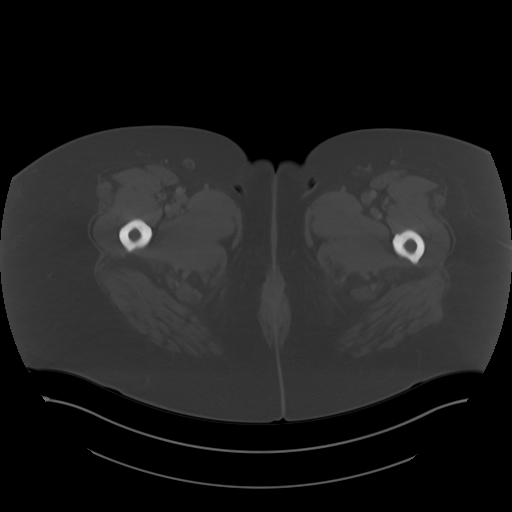
[im 16/99  soft-tissue]
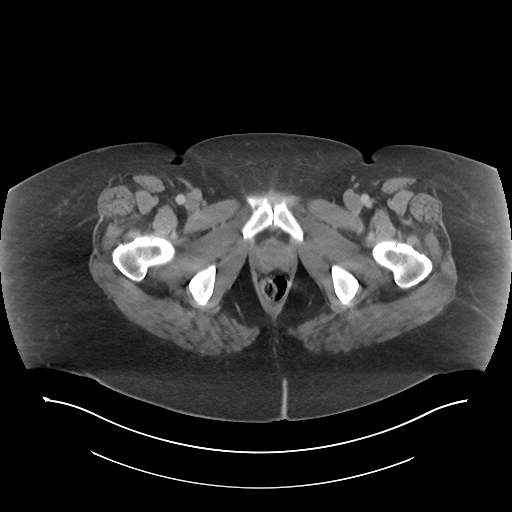
[im 21/99  soft-tissue]
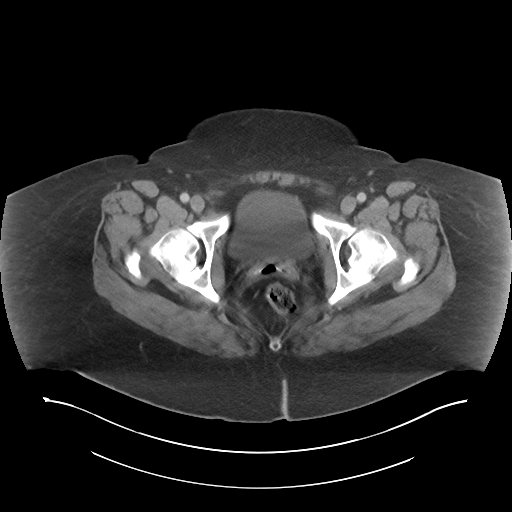
[im 26/99  soft-tissue]
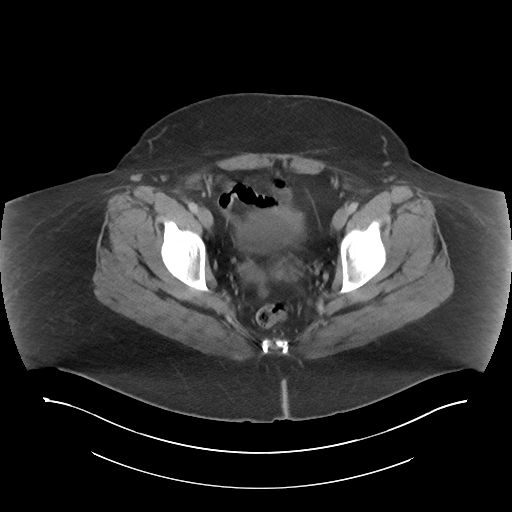
[im 37/99  soft-tissue]
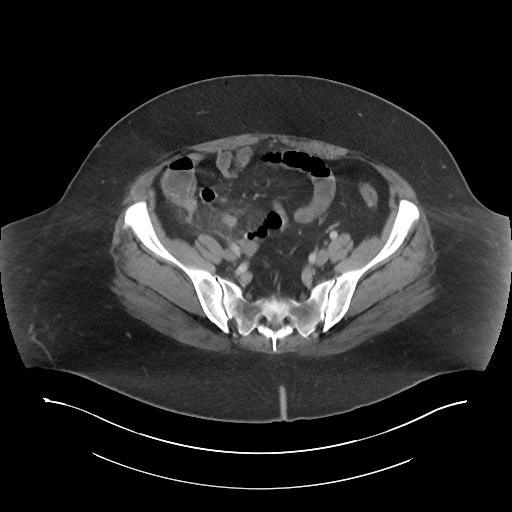
[im 42/99  soft-tissue]
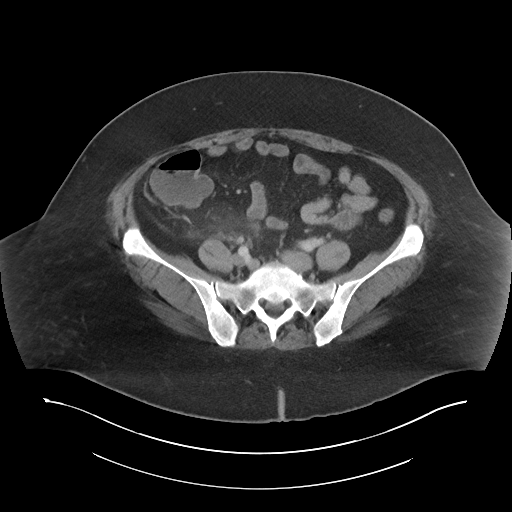
[im 52/99  soft-tissue]
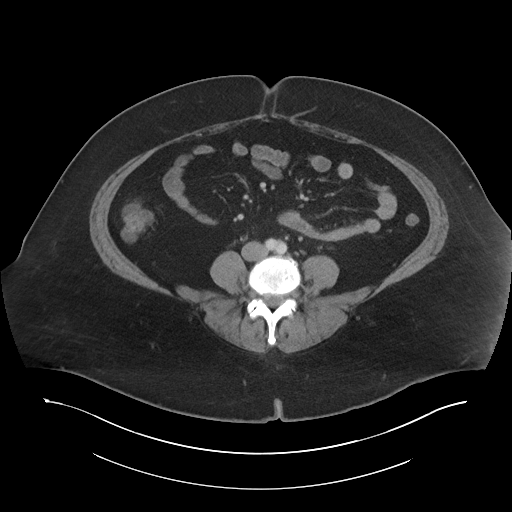
[im 57/99  soft-tissue]
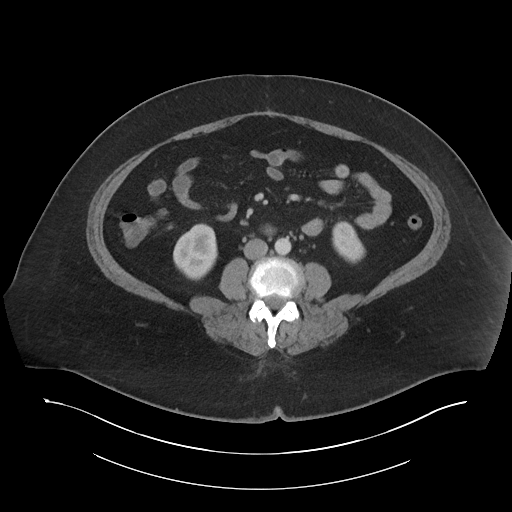
[im 62/99  soft-tissue]
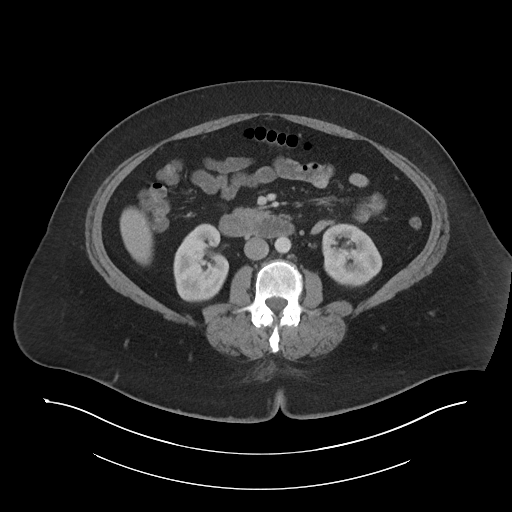
[im 62/99  bone]
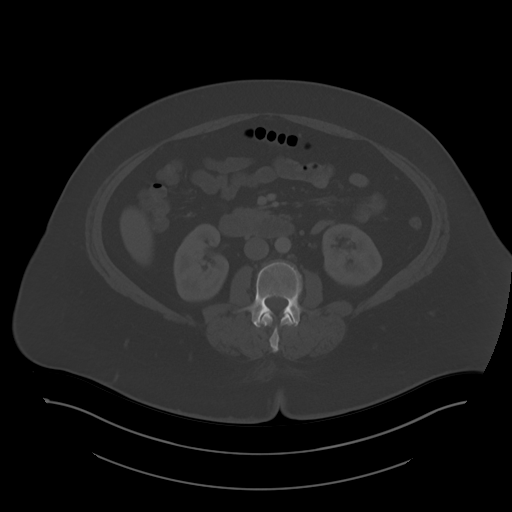
[im 73/99  soft-tissue]
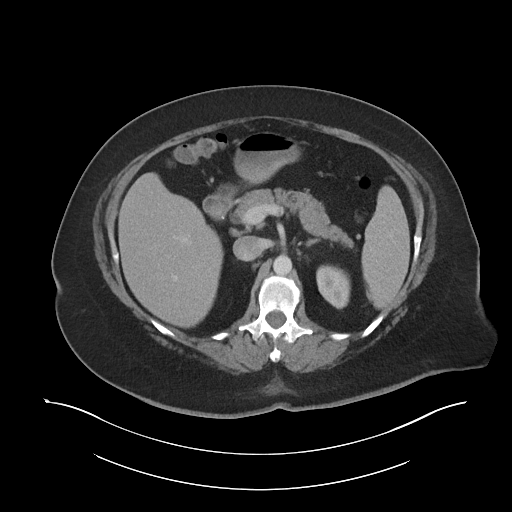
[im 78/99  soft-tissue]
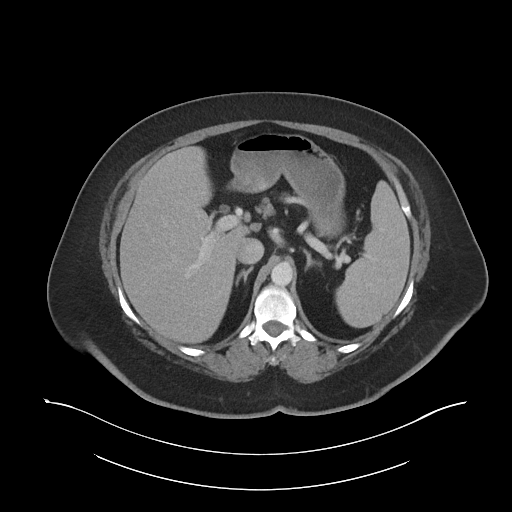
[im 83/99  soft-tissue]
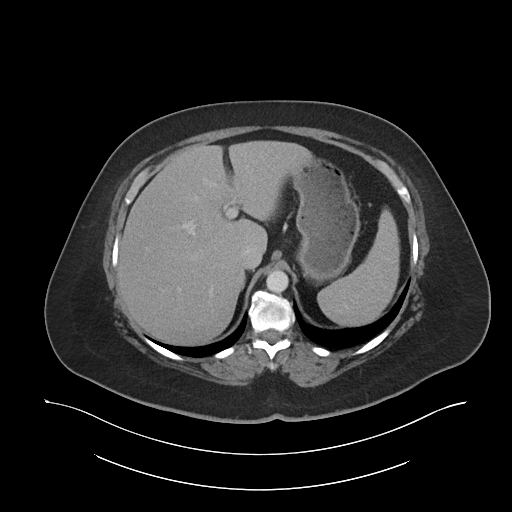
[im 93/99  soft-tissue]
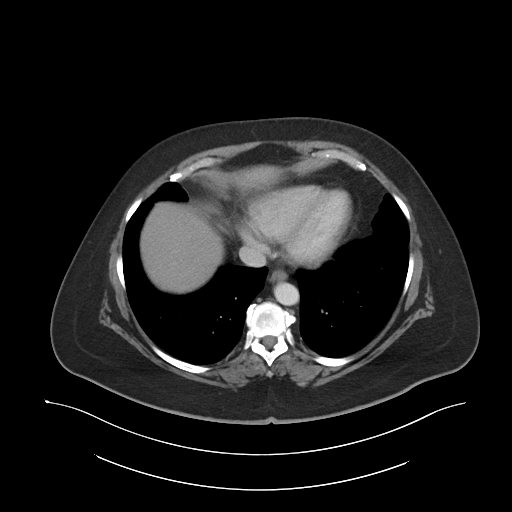

[Series 5: coronal · coronal · 0.97mm/px · 3 of 125 slices shown]
[im 42/125  soft-tissue]
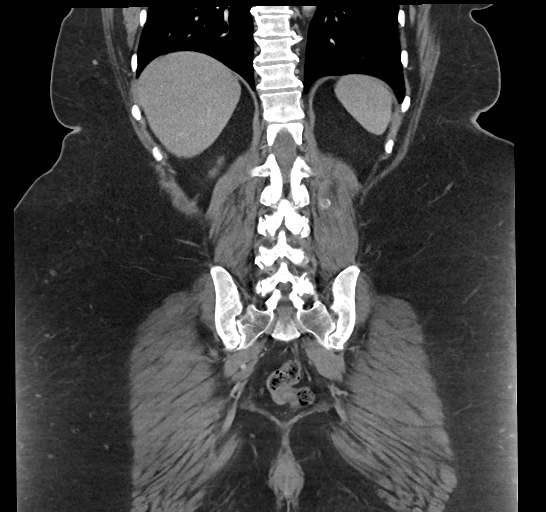
[im 56/125  soft-tissue]
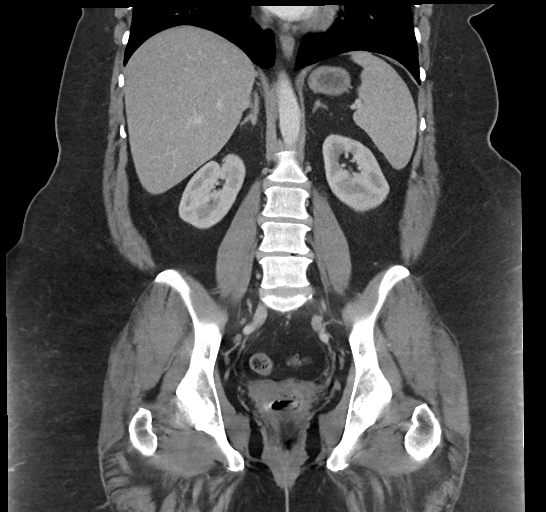
[im 69/125  soft-tissue]
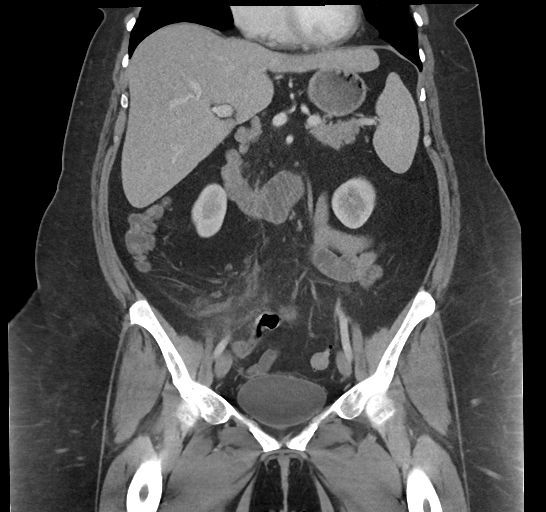

[16 of 46 positions shown; findings below may reference images not displayed]

RADIATION DOSE REDUCTION: This exam was performed according to the
departmental dose-optimization program which includes automated
exposure control, adjustment of the mA and/or kV according to
patient size and/or use of iterative reconstruction technique.

CONTRAST:  100mL OMNIPAQUE IOHEXOL 300 MG/ML  SOLN
FINDINGS: Lower chest: No acute abnormality.

Hepatobiliary: No focal liver abnormality is seen. Status post
cholecystectomy. No biliary dilatation.

Pancreas: Unremarkable. No pancreatic ductal dilatation or
surrounding inflammatory changes.

Spleen: Normal in size without significant abnormality.

Adrenals/Urinary Tract: Adrenal glands are unremarkable. Kidneys are
normal, without renal calculi, solid lesion, or hydronephrosis.
Bladder is unremarkable.

Stomach/Bowel: Stomach is within normal limits. Thickened appendix
measuring up to 1.2 cm in caliber with adjacent fat stranding
(series 2, image 65). No evidence of bowel wall thickening,
distention, or inflammatory changes. Sigmoid diverticula.

Vascular/Lymphatic: No significant vascular findings are present. No
enlarged abdominal or pelvic lymph nodes.

Reproductive: Status post hysterectomy.

Other: No abdominal wall hernia or abnormality. No ascites.

Musculoskeletal: No acute or significant osseous findings.
IMPRESSION: Thickened appendix measuring up to 1.2 cm in caliber with adjacent
fat stranding. Findings are consistent with acute appendicitis. No
evidence of perforation or abscess.

## 2023-10-02 DIAGNOSIS — H524 Presbyopia: Secondary | ICD-10-CM | POA: Diagnosis not present

## 2023-10-02 DIAGNOSIS — H5213 Myopia, bilateral: Secondary | ICD-10-CM | POA: Diagnosis not present

## 2023-10-02 DIAGNOSIS — H52222 Regular astigmatism, left eye: Secondary | ICD-10-CM | POA: Diagnosis not present

## 2023-10-02 DIAGNOSIS — E119 Type 2 diabetes mellitus without complications: Secondary | ICD-10-CM | POA: Diagnosis not present

## 2023-10-08 ENCOUNTER — Other Ambulatory Visit (HOSPITAL_COMMUNITY): Payer: Self-pay

## 2023-10-09 ENCOUNTER — Other Ambulatory Visit (HOSPITAL_COMMUNITY): Payer: Self-pay

## 2023-10-09 ENCOUNTER — Other Ambulatory Visit: Payer: Self-pay

## 2023-10-09 MED ORDER — MOUNJARO 12.5 MG/0.5ML ~~LOC~~ SOAJ
12.5000 mg | SUBCUTANEOUS | 0 refills | Status: DC
  Filled 2023-10-09 – 2023-10-17 (×2): qty 6, 84d supply, fill #0

## 2023-10-09 MED ORDER — VENLAFAXINE HCL ER 150 MG PO CP24
150.0000 mg | ORAL_CAPSULE | Freq: Every day | ORAL | 0 refills | Status: DC
  Filled 2023-10-09: qty 90, 90d supply, fill #0

## 2023-10-09 MED ORDER — PROPRANOLOL HCL ER 60 MG PO CP24
60.0000 mg | ORAL_CAPSULE | Freq: Every day | ORAL | 0 refills | Status: DC
  Filled 2023-10-09: qty 90, 90d supply, fill #0

## 2023-10-09 MED ORDER — LEVOTHYROXINE SODIUM 75 MCG PO TABS
75.0000 ug | ORAL_TABLET | Freq: Every morning | ORAL | 1 refills | Status: AC
  Filled 2023-10-09: qty 90, 90d supply, fill #0
  Filled 2024-01-12: qty 90, 90d supply, fill #1

## 2023-10-17 ENCOUNTER — Other Ambulatory Visit (HOSPITAL_COMMUNITY): Payer: Self-pay

## 2023-10-17 ENCOUNTER — Other Ambulatory Visit: Payer: Self-pay

## 2023-10-30 DIAGNOSIS — Z23 Encounter for immunization: Secondary | ICD-10-CM | POA: Diagnosis not present

## 2023-10-30 DIAGNOSIS — Z0000 Encounter for general adult medical examination without abnormal findings: Secondary | ICD-10-CM | POA: Diagnosis not present

## 2023-10-30 DIAGNOSIS — Z6832 Body mass index (BMI) 32.0-32.9, adult: Secondary | ICD-10-CM | POA: Diagnosis not present

## 2023-10-30 DIAGNOSIS — E039 Hypothyroidism, unspecified: Secondary | ICD-10-CM | POA: Diagnosis not present

## 2023-11-07 ENCOUNTER — Other Ambulatory Visit: Payer: Self-pay | Admitting: Family Medicine

## 2023-11-07 DIAGNOSIS — Z1231 Encounter for screening mammogram for malignant neoplasm of breast: Secondary | ICD-10-CM

## 2023-11-15 ENCOUNTER — Ambulatory Visit
Admission: RE | Admit: 2023-11-15 | Discharge: 2023-11-15 | Disposition: A | Discharge status: Home / Self Care | Source: Ambulatory Visit | Attending: Family Medicine | Admitting: Family Medicine

## 2023-11-15 DIAGNOSIS — Z1231 Encounter for screening mammogram for malignant neoplasm of breast: Secondary | ICD-10-CM | POA: Diagnosis not present

## 2023-12-19 DIAGNOSIS — H10501 Unspecified blepharoconjunctivitis, right eye: Secondary | ICD-10-CM | POA: Diagnosis not present

## 2024-01-12 ENCOUNTER — Other Ambulatory Visit (HOSPITAL_COMMUNITY): Payer: Self-pay

## 2024-01-13 ENCOUNTER — Other Ambulatory Visit: Payer: Self-pay

## 2024-01-13 ENCOUNTER — Other Ambulatory Visit (HOSPITAL_COMMUNITY): Payer: Self-pay

## 2024-01-13 MED ORDER — VENLAFAXINE HCL ER 150 MG PO CP24
150.0000 mg | ORAL_CAPSULE | Freq: Every day | ORAL | 1 refills | Status: AC
  Filled 2024-01-13: qty 90, 90d supply, fill #0

## 2024-01-13 MED ORDER — MOUNJARO 12.5 MG/0.5ML ~~LOC~~ SOAJ
12.5000 mg | SUBCUTANEOUS | 0 refills | Status: AC
  Filled 2024-01-13: qty 6, 84d supply, fill #0

## 2024-01-13 MED ORDER — PROPRANOLOL HCL ER 60 MG PO CP24
60.0000 mg | ORAL_CAPSULE | Freq: Every day | ORAL | 1 refills | Status: AC
  Filled 2024-01-13: qty 90, 90d supply, fill #0

## 2024-02-06 ENCOUNTER — Other Ambulatory Visit (HOSPITAL_COMMUNITY): Payer: Self-pay

## 2024-02-06 MED ORDER — AMOXICILLIN 500 MG PO CAPS
500.0000 mg | ORAL_CAPSULE | ORAL | 1 refills | Status: AC
  Filled 2024-02-06: qty 21, 7d supply, fill #0
